# Patient Record
Sex: Male | Born: 1945 | Race: White | Hispanic: No | Marital: Married | Smoking: Never smoker
Health system: Southern US, Community
[De-identification: ages and names within clinical notes are randomized; demographics above are authoritative.]

## PROBLEM LIST (undated history)

## (undated) DIAGNOSIS — K219 Gastro-esophageal reflux disease without esophagitis: Secondary | ICD-10-CM

## (undated) DIAGNOSIS — I10 Essential (primary) hypertension: Secondary | ICD-10-CM

## (undated) DIAGNOSIS — D649 Anemia, unspecified: Secondary | ICD-10-CM

## (undated) DIAGNOSIS — K5792 Diverticulitis of intestine, part unspecified, without perforation or abscess without bleeding: Secondary | ICD-10-CM

## (undated) DIAGNOSIS — E039 Hypothyroidism, unspecified: Secondary | ICD-10-CM

## (undated) DIAGNOSIS — T7840XA Allergy, unspecified, initial encounter: Secondary | ICD-10-CM

## (undated) DIAGNOSIS — E079 Disorder of thyroid, unspecified: Secondary | ICD-10-CM

## (undated) DIAGNOSIS — F419 Anxiety disorder, unspecified: Secondary | ICD-10-CM

## (undated) DIAGNOSIS — E785 Hyperlipidemia, unspecified: Secondary | ICD-10-CM

## (undated) HISTORY — DX: Diverticulitis of intestine, part unspecified, without perforation or abscess without bleeding: K57.92

## (undated) HISTORY — DX: Disorder of thyroid, unspecified: E07.9

## (undated) HISTORY — DX: Allergy, unspecified, initial encounter: T78.40XA

## (undated) HISTORY — DX: Essential (primary) hypertension: I10

## (undated) HISTORY — DX: Anxiety disorder, unspecified: F41.9

## (undated) HISTORY — DX: Hyperlipidemia, unspecified: E78.5

## (undated) HISTORY — DX: Anemia, unspecified: D64.9

## (undated) HISTORY — DX: Gastro-esophageal reflux disease without esophagitis: K21.9

---
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
## 1989-07-03 DIAGNOSIS — E039 Hypothyroidism, unspecified: Secondary | ICD-10-CM | POA: Insufficient documentation

## 2002-11-03 HISTORY — PX: OTHER SURGICAL HISTORY: SHX169

## 2003-03-13 DIAGNOSIS — C439 Malignant melanoma of skin, unspecified: Secondary | ICD-10-CM

## 2003-03-13 HISTORY — DX: Malignant melanoma of skin, unspecified: C43.9

## 2003-11-04 DIAGNOSIS — K219 Gastro-esophageal reflux disease without esophagitis: Secondary | ICD-10-CM | POA: Insufficient documentation

## 2004-03-05 LAB — HM COLONOSCOPY

## 2004-11-21 ENCOUNTER — Ambulatory Visit: Payer: Self-pay | Admitting: Oncology

## 2005-05-20 ENCOUNTER — Ambulatory Visit: Payer: Self-pay | Admitting: Oncology

## 2005-06-03 ENCOUNTER — Ambulatory Visit: Payer: Self-pay | Admitting: Oncology

## 2005-08-22 ENCOUNTER — Ambulatory Visit: Payer: Self-pay | Admitting: Oncology

## 2005-08-27 ENCOUNTER — Ambulatory Visit: Payer: Self-pay | Admitting: Oncology

## 2005-08-28 ENCOUNTER — Ambulatory Visit: Payer: Self-pay | Admitting: Gastroenterology

## 2005-09-03 ENCOUNTER — Ambulatory Visit: Payer: Self-pay | Admitting: Oncology

## 2005-10-03 ENCOUNTER — Ambulatory Visit: Payer: Self-pay | Admitting: Oncology

## 2006-02-19 ENCOUNTER — Ambulatory Visit: Payer: Self-pay | Admitting: Oncology

## 2006-03-03 ENCOUNTER — Ambulatory Visit: Payer: Self-pay | Admitting: Oncology

## 2006-08-28 ENCOUNTER — Ambulatory Visit: Payer: Self-pay | Admitting: Oncology

## 2006-08-31 ENCOUNTER — Ambulatory Visit: Payer: Self-pay | Admitting: Oncology

## 2006-09-03 ENCOUNTER — Ambulatory Visit: Payer: Self-pay | Admitting: Oncology

## 2006-12-15 DIAGNOSIS — F419 Anxiety disorder, unspecified: Secondary | ICD-10-CM | POA: Insufficient documentation

## 2007-02-02 ENCOUNTER — Ambulatory Visit: Payer: Self-pay | Admitting: Oncology

## 2007-02-23 DIAGNOSIS — E785 Hyperlipidemia, unspecified: Secondary | ICD-10-CM | POA: Insufficient documentation

## 2007-03-01 ENCOUNTER — Ambulatory Visit: Payer: Self-pay | Admitting: Oncology

## 2007-03-04 ENCOUNTER — Ambulatory Visit: Payer: Self-pay | Admitting: Oncology

## 2007-08-04 ENCOUNTER — Ambulatory Visit: Payer: Self-pay | Admitting: Oncology

## 2007-08-06 ENCOUNTER — Ambulatory Visit: Payer: Self-pay | Admitting: Oncology

## 2007-08-11 ENCOUNTER — Ambulatory Visit: Payer: Self-pay | Admitting: Oncology

## 2007-09-04 ENCOUNTER — Ambulatory Visit: Payer: Self-pay | Admitting: Oncology

## 2007-10-06 DIAGNOSIS — N400 Enlarged prostate without lower urinary tract symptoms: Secondary | ICD-10-CM | POA: Insufficient documentation

## 2008-02-02 ENCOUNTER — Ambulatory Visit: Payer: Self-pay | Admitting: Oncology

## 2008-02-10 ENCOUNTER — Ambulatory Visit: Payer: Self-pay | Admitting: Oncology

## 2008-03-03 ENCOUNTER — Ambulatory Visit: Payer: Self-pay | Admitting: Oncology

## 2008-10-30 ENCOUNTER — Ambulatory Visit: Payer: Self-pay | Admitting: Family Medicine

## 2009-02-22 DIAGNOSIS — J309 Allergic rhinitis, unspecified: Secondary | ICD-10-CM | POA: Insufficient documentation

## 2010-02-18 DIAGNOSIS — I10 Essential (primary) hypertension: Secondary | ICD-10-CM | POA: Insufficient documentation

## 2012-06-25 DIAGNOSIS — H43399 Other vitreous opacities, unspecified eye: Secondary | ICD-10-CM | POA: Diagnosis not present

## 2012-08-13 DIAGNOSIS — F411 Generalized anxiety disorder: Secondary | ICD-10-CM | POA: Diagnosis not present

## 2012-08-13 DIAGNOSIS — I10 Essential (primary) hypertension: Secondary | ICD-10-CM | POA: Diagnosis not present

## 2012-08-13 DIAGNOSIS — Z23 Encounter for immunization: Secondary | ICD-10-CM | POA: Diagnosis not present

## 2012-08-13 DIAGNOSIS — E782 Mixed hyperlipidemia: Secondary | ICD-10-CM | POA: Diagnosis not present

## 2012-08-13 DIAGNOSIS — E039 Hypothyroidism, unspecified: Secondary | ICD-10-CM | POA: Diagnosis not present

## 2012-08-16 DIAGNOSIS — E782 Mixed hyperlipidemia: Secondary | ICD-10-CM | POA: Diagnosis not present

## 2012-08-16 DIAGNOSIS — I10 Essential (primary) hypertension: Secondary | ICD-10-CM | POA: Diagnosis not present

## 2012-08-16 DIAGNOSIS — E039 Hypothyroidism, unspecified: Secondary | ICD-10-CM | POA: Diagnosis not present

## 2012-10-14 DIAGNOSIS — Z23 Encounter for immunization: Secondary | ICD-10-CM | POA: Diagnosis not present

## 2012-10-14 DIAGNOSIS — J111 Influenza due to unidentified influenza virus with other respiratory manifestations: Secondary | ICD-10-CM | POA: Diagnosis not present

## 2012-10-14 DIAGNOSIS — F411 Generalized anxiety disorder: Secondary | ICD-10-CM | POA: Diagnosis not present

## 2013-02-03 DIAGNOSIS — E782 Mixed hyperlipidemia: Secondary | ICD-10-CM | POA: Diagnosis not present

## 2013-02-03 DIAGNOSIS — I10 Essential (primary) hypertension: Secondary | ICD-10-CM | POA: Diagnosis not present

## 2013-02-03 DIAGNOSIS — E039 Hypothyroidism, unspecified: Secondary | ICD-10-CM | POA: Diagnosis not present

## 2013-02-07 DIAGNOSIS — L570 Actinic keratosis: Secondary | ICD-10-CM | POA: Diagnosis not present

## 2013-02-07 DIAGNOSIS — D235 Other benign neoplasm of skin of trunk: Secondary | ICD-10-CM | POA: Diagnosis not present

## 2013-02-07 DIAGNOSIS — Z85820 Personal history of malignant melanoma of skin: Secondary | ICD-10-CM | POA: Diagnosis not present

## 2013-02-11 DIAGNOSIS — E785 Hyperlipidemia, unspecified: Secondary | ICD-10-CM | POA: Diagnosis not present

## 2013-02-11 DIAGNOSIS — E039 Hypothyroidism, unspecified: Secondary | ICD-10-CM | POA: Diagnosis not present

## 2013-02-11 DIAGNOSIS — F411 Generalized anxiety disorder: Secondary | ICD-10-CM | POA: Diagnosis not present

## 2013-02-11 DIAGNOSIS — I10 Essential (primary) hypertension: Secondary | ICD-10-CM | POA: Diagnosis not present

## 2013-05-23 DIAGNOSIS — I10 Essential (primary) hypertension: Secondary | ICD-10-CM | POA: Diagnosis not present

## 2013-05-23 DIAGNOSIS — E785 Hyperlipidemia, unspecified: Secondary | ICD-10-CM | POA: Diagnosis not present

## 2013-05-23 DIAGNOSIS — E039 Hypothyroidism, unspecified: Secondary | ICD-10-CM | POA: Diagnosis not present

## 2013-05-23 DIAGNOSIS — J069 Acute upper respiratory infection, unspecified: Secondary | ICD-10-CM | POA: Diagnosis not present

## 2013-07-20 DIAGNOSIS — Z23 Encounter for immunization: Secondary | ICD-10-CM | POA: Diagnosis not present

## 2013-07-20 DIAGNOSIS — J069 Acute upper respiratory infection, unspecified: Secondary | ICD-10-CM | POA: Diagnosis not present

## 2013-07-20 DIAGNOSIS — E785 Hyperlipidemia, unspecified: Secondary | ICD-10-CM | POA: Diagnosis not present

## 2013-07-20 DIAGNOSIS — E039 Hypothyroidism, unspecified: Secondary | ICD-10-CM | POA: Diagnosis not present

## 2013-07-20 DIAGNOSIS — I10 Essential (primary) hypertension: Secondary | ICD-10-CM | POA: Diagnosis not present

## 2013-08-01 DIAGNOSIS — I10 Essential (primary) hypertension: Secondary | ICD-10-CM | POA: Diagnosis not present

## 2013-08-01 DIAGNOSIS — E785 Hyperlipidemia, unspecified: Secondary | ICD-10-CM | POA: Diagnosis not present

## 2013-08-01 DIAGNOSIS — Z125 Encounter for screening for malignant neoplasm of prostate: Secondary | ICD-10-CM | POA: Diagnosis not present

## 2013-08-01 DIAGNOSIS — E039 Hypothyroidism, unspecified: Secondary | ICD-10-CM | POA: Diagnosis not present

## 2013-08-15 DIAGNOSIS — E039 Hypothyroidism, unspecified: Secondary | ICD-10-CM | POA: Diagnosis not present

## 2013-08-15 DIAGNOSIS — Z1339 Encounter for screening examination for other mental health and behavioral disorders: Secondary | ICD-10-CM | POA: Diagnosis not present

## 2013-08-15 DIAGNOSIS — Z0000 Encounter for general adult medical examination without abnormal findings: Secondary | ICD-10-CM | POA: Diagnosis not present

## 2013-08-15 DIAGNOSIS — I10 Essential (primary) hypertension: Secondary | ICD-10-CM | POA: Diagnosis not present

## 2013-08-15 DIAGNOSIS — E785 Hyperlipidemia, unspecified: Secondary | ICD-10-CM | POA: Diagnosis not present

## 2013-08-15 DIAGNOSIS — Z125 Encounter for screening for malignant neoplasm of prostate: Secondary | ICD-10-CM | POA: Diagnosis not present

## 2013-08-15 DIAGNOSIS — Z23 Encounter for immunization: Secondary | ICD-10-CM | POA: Diagnosis not present

## 2013-08-15 DIAGNOSIS — Z1212 Encounter for screening for malignant neoplasm of rectum: Secondary | ICD-10-CM | POA: Diagnosis not present

## 2013-08-29 DIAGNOSIS — D235 Other benign neoplasm of skin of trunk: Secondary | ICD-10-CM | POA: Diagnosis not present

## 2013-08-29 DIAGNOSIS — D485 Neoplasm of uncertain behavior of skin: Secondary | ICD-10-CM | POA: Diagnosis not present

## 2013-08-29 DIAGNOSIS — Z85820 Personal history of malignant melanoma of skin: Secondary | ICD-10-CM | POA: Diagnosis not present

## 2013-08-29 DIAGNOSIS — L570 Actinic keratosis: Secondary | ICD-10-CM | POA: Diagnosis not present

## 2013-10-13 DIAGNOSIS — D0460 Carcinoma in situ of skin of unspecified upper limb, including shoulder: Secondary | ICD-10-CM | POA: Diagnosis not present

## 2013-11-01 DIAGNOSIS — D485 Neoplasm of uncertain behavior of skin: Secondary | ICD-10-CM | POA: Diagnosis not present

## 2013-11-01 DIAGNOSIS — B372 Candidiasis of skin and nail: Secondary | ICD-10-CM | POA: Diagnosis not present

## 2013-11-14 DIAGNOSIS — D0470 Carcinoma in situ of skin of unspecified lower limb, including hip: Secondary | ICD-10-CM | POA: Diagnosis not present

## 2014-02-03 DIAGNOSIS — I10 Essential (primary) hypertension: Secondary | ICD-10-CM | POA: Diagnosis not present

## 2014-02-03 DIAGNOSIS — E039 Hypothyroidism, unspecified: Secondary | ICD-10-CM | POA: Diagnosis not present

## 2014-02-03 DIAGNOSIS — E785 Hyperlipidemia, unspecified: Secondary | ICD-10-CM | POA: Diagnosis not present

## 2014-02-03 DIAGNOSIS — Z23 Encounter for immunization: Secondary | ICD-10-CM | POA: Diagnosis not present

## 2014-02-03 DIAGNOSIS — R1011 Right upper quadrant pain: Secondary | ICD-10-CM | POA: Diagnosis not present

## 2014-02-03 DIAGNOSIS — Z1212 Encounter for screening for malignant neoplasm of rectum: Secondary | ICD-10-CM | POA: Diagnosis not present

## 2014-02-09 ENCOUNTER — Ambulatory Visit: Payer: Self-pay | Admitting: Family Medicine

## 2014-02-09 DIAGNOSIS — Q619 Cystic kidney disease, unspecified: Secondary | ICD-10-CM | POA: Diagnosis not present

## 2014-02-09 DIAGNOSIS — N281 Cyst of kidney, acquired: Secondary | ICD-10-CM | POA: Diagnosis not present

## 2014-02-09 DIAGNOSIS — K7689 Other specified diseases of liver: Secondary | ICD-10-CM | POA: Diagnosis not present

## 2014-02-09 DIAGNOSIS — R1011 Right upper quadrant pain: Secondary | ICD-10-CM | POA: Diagnosis not present

## 2014-02-10 DIAGNOSIS — Z1212 Encounter for screening for malignant neoplasm of rectum: Secondary | ICD-10-CM | POA: Diagnosis not present

## 2014-02-10 DIAGNOSIS — K7689 Other specified diseases of liver: Secondary | ICD-10-CM | POA: Diagnosis not present

## 2014-02-10 DIAGNOSIS — Z23 Encounter for immunization: Secondary | ICD-10-CM | POA: Diagnosis not present

## 2014-02-10 DIAGNOSIS — E039 Hypothyroidism, unspecified: Secondary | ICD-10-CM | POA: Diagnosis not present

## 2014-02-10 DIAGNOSIS — E785 Hyperlipidemia, unspecified: Secondary | ICD-10-CM | POA: Diagnosis not present

## 2014-02-27 DIAGNOSIS — D485 Neoplasm of uncertain behavior of skin: Secondary | ICD-10-CM | POA: Diagnosis not present

## 2014-02-27 DIAGNOSIS — Z85820 Personal history of malignant melanoma of skin: Secondary | ICD-10-CM | POA: Diagnosis not present

## 2014-02-27 DIAGNOSIS — L570 Actinic keratosis: Secondary | ICD-10-CM | POA: Diagnosis not present

## 2014-02-27 DIAGNOSIS — Z85828 Personal history of other malignant neoplasm of skin: Secondary | ICD-10-CM | POA: Diagnosis not present

## 2014-02-27 DIAGNOSIS — D045 Carcinoma in situ of skin of trunk: Secondary | ICD-10-CM | POA: Diagnosis not present

## 2014-04-03 DIAGNOSIS — D045 Carcinoma in situ of skin of trunk: Secondary | ICD-10-CM | POA: Diagnosis not present

## 2014-04-13 ENCOUNTER — Ambulatory Visit: Payer: Self-pay | Admitting: Family Medicine

## 2014-04-13 DIAGNOSIS — Z1212 Encounter for screening for malignant neoplasm of rectum: Secondary | ICD-10-CM | POA: Diagnosis not present

## 2014-04-13 DIAGNOSIS — Z23 Encounter for immunization: Secondary | ICD-10-CM | POA: Diagnosis not present

## 2014-04-13 DIAGNOSIS — R059 Cough, unspecified: Secondary | ICD-10-CM | POA: Diagnosis not present

## 2014-04-13 DIAGNOSIS — R05 Cough: Secondary | ICD-10-CM | POA: Diagnosis not present

## 2014-04-13 DIAGNOSIS — E039 Hypothyroidism, unspecified: Secondary | ICD-10-CM | POA: Diagnosis not present

## 2014-04-13 DIAGNOSIS — J9819 Other pulmonary collapse: Secondary | ICD-10-CM | POA: Diagnosis not present

## 2014-04-13 DIAGNOSIS — K449 Diaphragmatic hernia without obstruction or gangrene: Secondary | ICD-10-CM | POA: Diagnosis not present

## 2014-04-13 DIAGNOSIS — J069 Acute upper respiratory infection, unspecified: Secondary | ICD-10-CM | POA: Diagnosis not present

## 2014-04-13 DIAGNOSIS — E785 Hyperlipidemia, unspecified: Secondary | ICD-10-CM | POA: Diagnosis not present

## 2014-06-26 DIAGNOSIS — L570 Actinic keratosis: Secondary | ICD-10-CM | POA: Diagnosis not present

## 2014-07-17 DIAGNOSIS — K7689 Other specified diseases of liver: Secondary | ICD-10-CM | POA: Diagnosis not present

## 2014-07-17 DIAGNOSIS — H60399 Other infective otitis externa, unspecified ear: Secondary | ICD-10-CM | POA: Diagnosis not present

## 2014-07-17 DIAGNOSIS — Z1212 Encounter for screening for malignant neoplasm of rectum: Secondary | ICD-10-CM | POA: Diagnosis not present

## 2014-07-17 DIAGNOSIS — E785 Hyperlipidemia, unspecified: Secondary | ICD-10-CM | POA: Diagnosis not present

## 2014-07-17 DIAGNOSIS — Z23 Encounter for immunization: Secondary | ICD-10-CM | POA: Diagnosis not present

## 2014-07-17 DIAGNOSIS — E039 Hypothyroidism, unspecified: Secondary | ICD-10-CM | POA: Diagnosis not present

## 2014-07-24 DIAGNOSIS — L570 Actinic keratosis: Secondary | ICD-10-CM | POA: Diagnosis not present

## 2014-08-24 DIAGNOSIS — D225 Melanocytic nevi of trunk: Secondary | ICD-10-CM | POA: Diagnosis not present

## 2014-08-24 DIAGNOSIS — L570 Actinic keratosis: Secondary | ICD-10-CM | POA: Diagnosis not present

## 2014-08-24 DIAGNOSIS — Z85820 Personal history of malignant melanoma of skin: Secondary | ICD-10-CM | POA: Diagnosis not present

## 2014-08-24 DIAGNOSIS — D2261 Melanocytic nevi of right upper limb, including shoulder: Secondary | ICD-10-CM | POA: Diagnosis not present

## 2014-08-24 DIAGNOSIS — X32XXXA Exposure to sunlight, initial encounter: Secondary | ICD-10-CM | POA: Diagnosis not present

## 2014-08-24 DIAGNOSIS — D2262 Melanocytic nevi of left upper limb, including shoulder: Secondary | ICD-10-CM | POA: Diagnosis not present

## 2014-09-05 DIAGNOSIS — E785 Hyperlipidemia, unspecified: Secondary | ICD-10-CM | POA: Diagnosis not present

## 2014-09-05 DIAGNOSIS — E039 Hypothyroidism, unspecified: Secondary | ICD-10-CM | POA: Diagnosis not present

## 2014-09-05 DIAGNOSIS — D649 Anemia, unspecified: Secondary | ICD-10-CM | POA: Diagnosis not present

## 2014-09-05 DIAGNOSIS — R1011 Right upper quadrant pain: Secondary | ICD-10-CM | POA: Diagnosis not present

## 2014-09-05 LAB — LIPID PANEL
CHOLESTEROL: 171 mg/dL (ref 0–200)
HDL: 41 mg/dL (ref 35–70)
LDL CALC: 106 mg/dL
TRIGLYCERIDES: 122 mg/dL (ref 40–160)

## 2014-09-05 LAB — CBC AND DIFFERENTIAL
HEMATOCRIT: 40 % — AB (ref 41–53)
Hemoglobin: 13.3 g/dL — AB (ref 13.5–17.5)
PLATELETS: 165 10*3/uL (ref 150–399)
WBC: 6 10*3/mL

## 2014-09-05 LAB — BASIC METABOLIC PANEL
BUN: 13 mg/dL (ref 4–21)
CREATININE: 1.1 mg/dL (ref 0.6–1.3)
Glucose: 97 mg/dL
Potassium: 4.2 mmol/L (ref 3.4–5.3)
Sodium: 140 mmol/L (ref 137–147)

## 2014-09-05 LAB — HEPATIC FUNCTION PANEL
ALT: 21 U/L (ref 10–40)
AST: 24 U/L (ref 14–40)

## 2014-09-14 DIAGNOSIS — E039 Hypothyroidism, unspecified: Secondary | ICD-10-CM | POA: Diagnosis not present

## 2014-09-14 DIAGNOSIS — E785 Hyperlipidemia, unspecified: Secondary | ICD-10-CM | POA: Diagnosis not present

## 2014-09-14 DIAGNOSIS — D649 Anemia, unspecified: Secondary | ICD-10-CM | POA: Diagnosis not present

## 2014-09-14 DIAGNOSIS — F411 Generalized anxiety disorder: Secondary | ICD-10-CM | POA: Diagnosis not present

## 2014-09-14 DIAGNOSIS — Z23 Encounter for immunization: Secondary | ICD-10-CM | POA: Diagnosis not present

## 2014-11-04 DIAGNOSIS — Z23 Encounter for immunization: Secondary | ICD-10-CM | POA: Diagnosis not present

## 2014-11-04 DIAGNOSIS — D649 Anemia, unspecified: Secondary | ICD-10-CM | POA: Diagnosis not present

## 2014-11-04 DIAGNOSIS — J019 Acute sinusitis, unspecified: Secondary | ICD-10-CM | POA: Diagnosis not present

## 2014-11-04 DIAGNOSIS — F411 Generalized anxiety disorder: Secondary | ICD-10-CM | POA: Diagnosis not present

## 2014-11-04 DIAGNOSIS — E039 Hypothyroidism, unspecified: Secondary | ICD-10-CM | POA: Diagnosis not present

## 2014-11-27 DIAGNOSIS — E039 Hypothyroidism, unspecified: Secondary | ICD-10-CM | POA: Diagnosis not present

## 2014-11-27 DIAGNOSIS — F411 Generalized anxiety disorder: Secondary | ICD-10-CM | POA: Diagnosis not present

## 2014-11-27 DIAGNOSIS — D649 Anemia, unspecified: Secondary | ICD-10-CM | POA: Diagnosis not present

## 2014-11-27 DIAGNOSIS — Z23 Encounter for immunization: Secondary | ICD-10-CM | POA: Diagnosis not present

## 2014-11-27 DIAGNOSIS — J019 Acute sinusitis, unspecified: Secondary | ICD-10-CM | POA: Diagnosis not present

## 2015-01-03 DIAGNOSIS — H2513 Age-related nuclear cataract, bilateral: Secondary | ICD-10-CM | POA: Diagnosis not present

## 2015-01-10 IMAGING — US ABDOMEN ULTRASOUND
1 series · 14 of 25 positions shown · non-contrast
Comparison: PET-CT, 08/28/2006

CLINICAL DATA: Right upper quadrant abdominal pain.

EXAM:
ULTRASOUND ABDOMEN COMPLETE

[Series 1: abdomen ultrasound · 0.30mm/px · 14 of 99 slices shown]
[im 1/99]
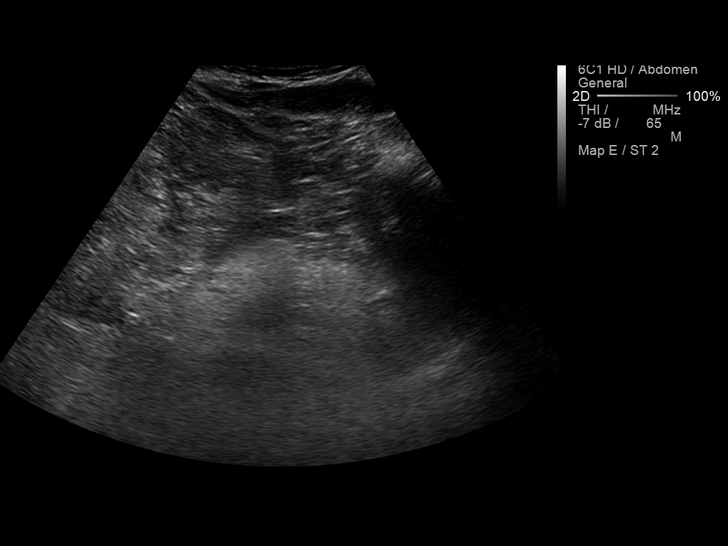
[im 9/99]
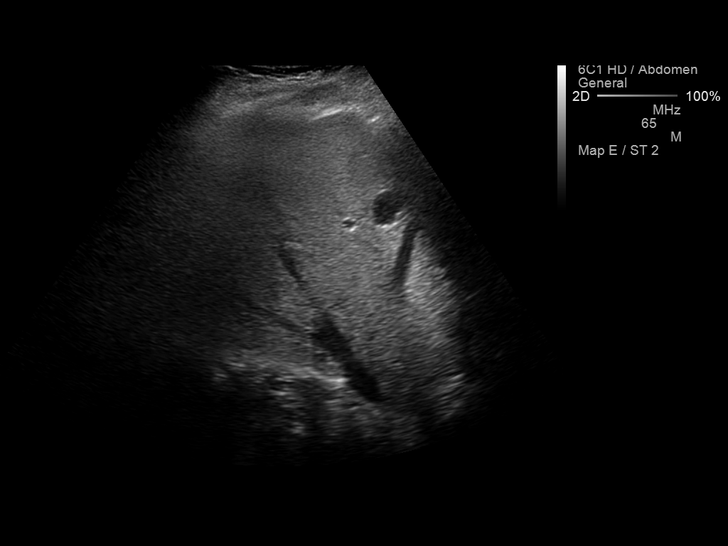
[im 17/99]
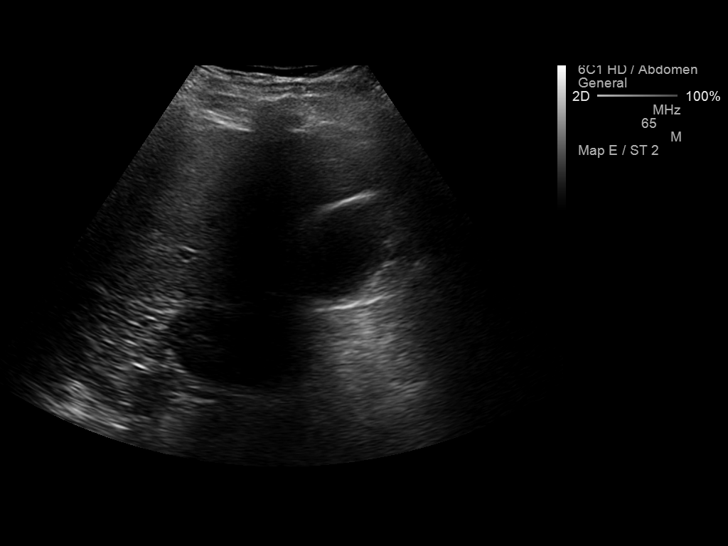
[im 25/99]
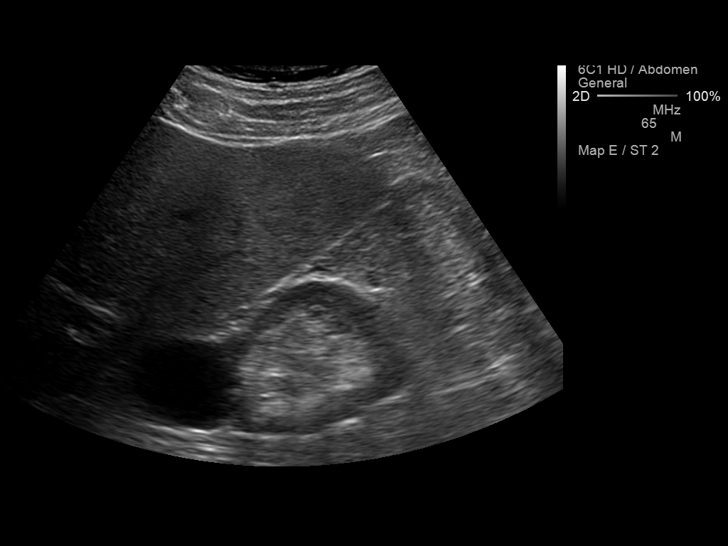
[im 33/99]
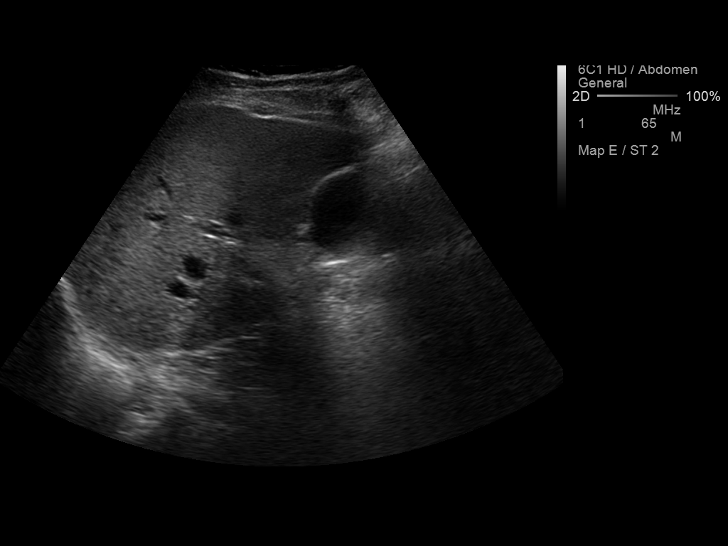
[im 37/99]
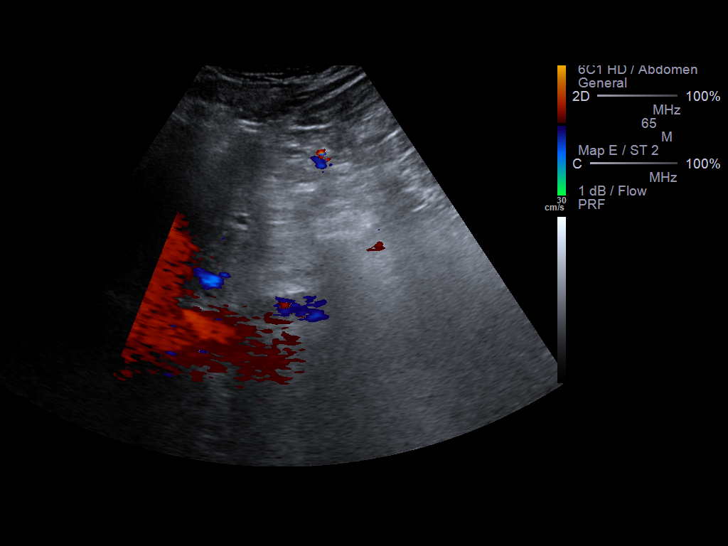
[im 45/99]
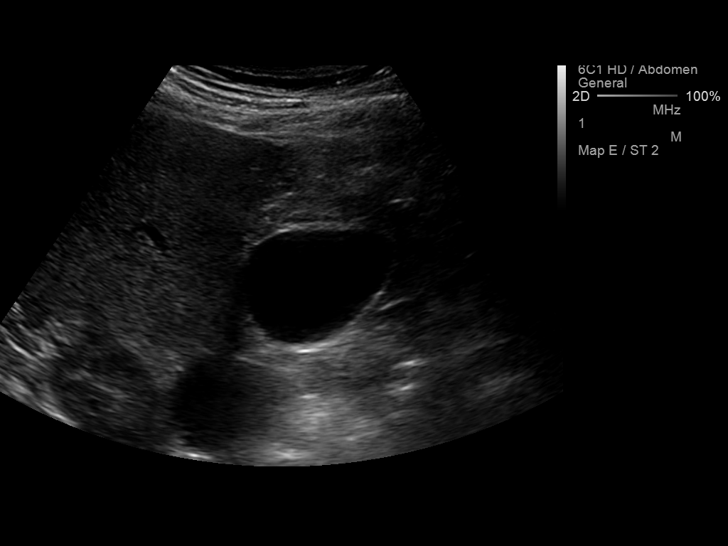
[im 54/99]
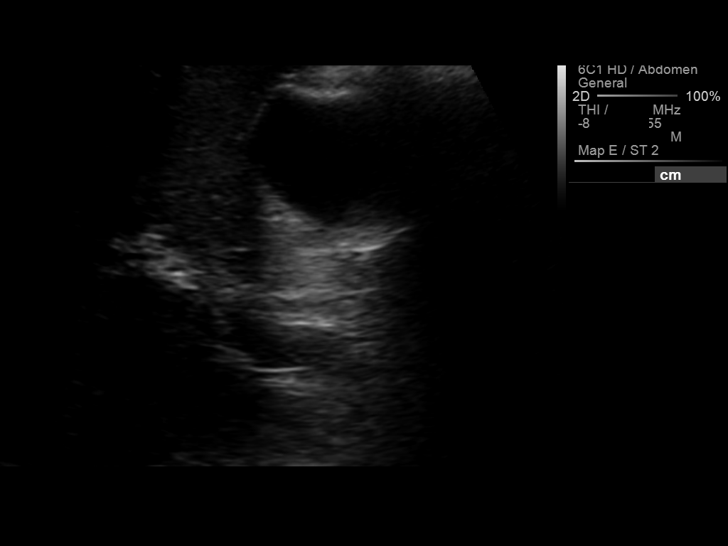
[im 62/99]
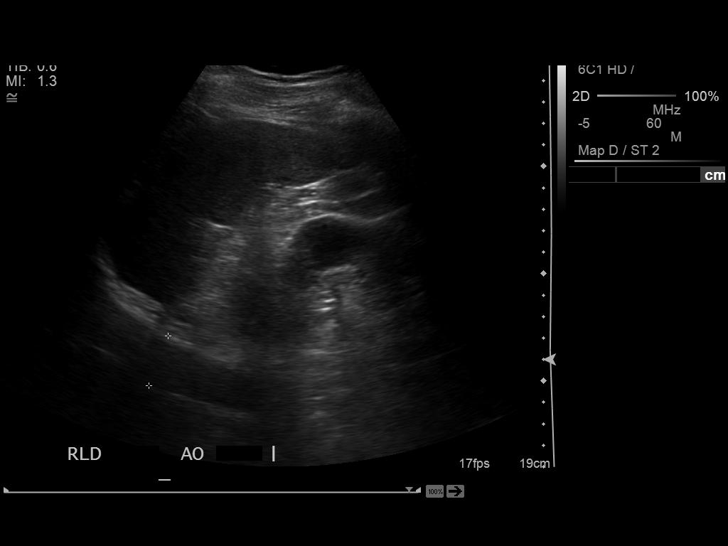
[im 66/99]
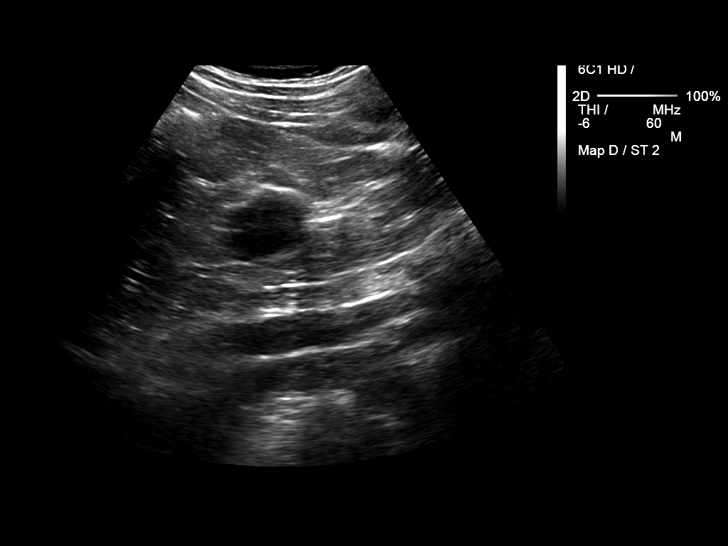
[im 74/99]
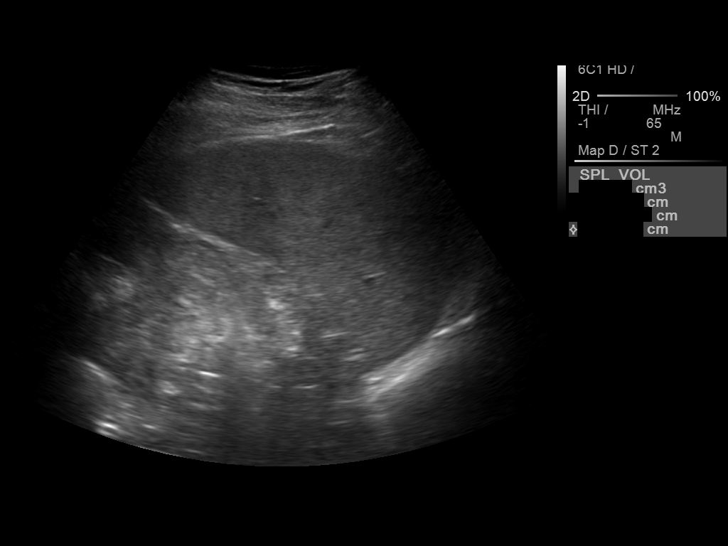
[im 82/99]
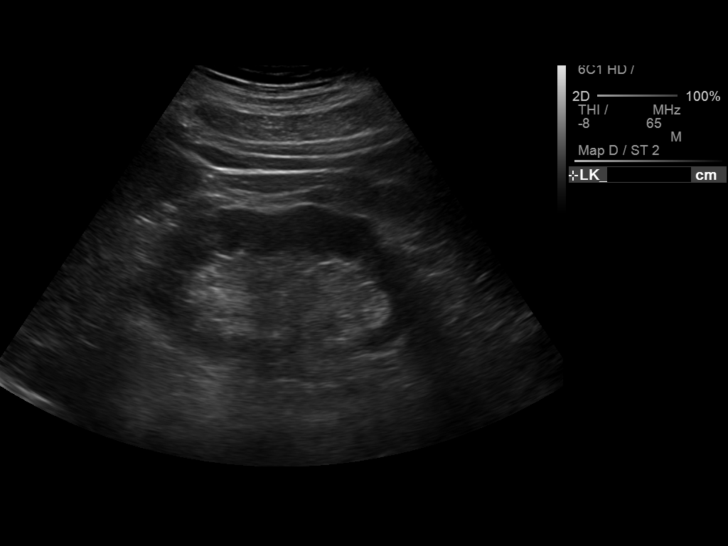
[im 90/99]
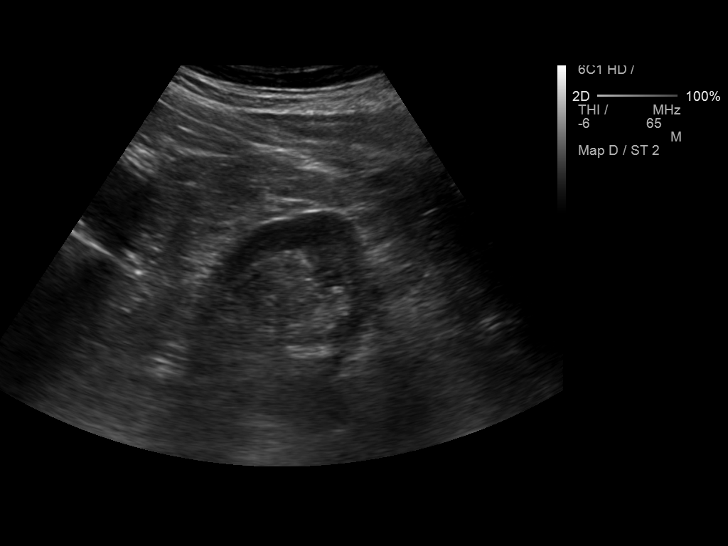
[im 99/99]
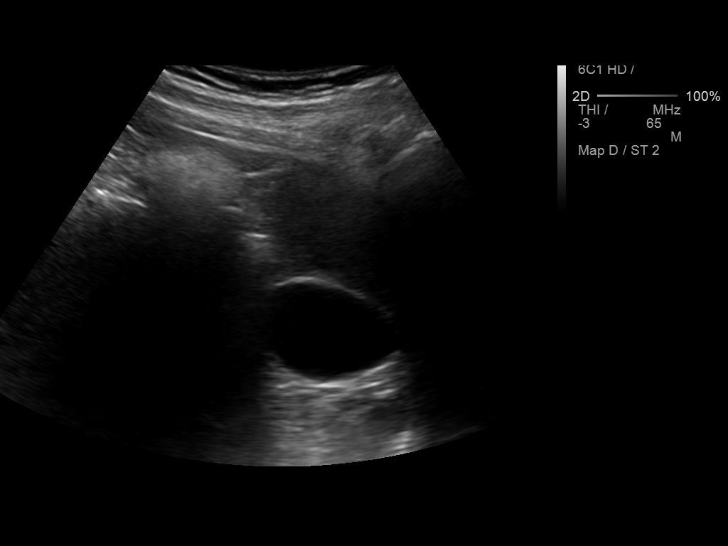

[14 of 25 positions shown; findings below may reference images not displayed]

FINDINGS: Gallbladder:

No gallstones or wall thickening visualized. No sonographic Murphy
sign noted.

Common bile duct:

Diameter: 3.8 mm

Liver:

Increased overall echogenicity. 2 cm cyst in the left lobe near the
gallbladder fossa. No other renal masses or lesions. Normal
hepatopetal flow in the portal vein.

IVC:

No abnormality visualized.

Pancreas:

Mostly obscured by bowel gas.

Spleen:

Size and appearance within normal limits.

Right Kidney:

Length: 9.7 cm. 3.6 cm upper pole simple cyst. No other renal
masses. No hydronephrosis.

Left Kidney:

Length: 10.8 cm. 5.5 cm upper pole simple appearing cyst. No other
renal masses. No hydronephrosis.

Abdominal aorta:

No aneurysm visualized.

Other findings:

None.
IMPRESSION: 1. No acute findings.  Normal gallbladder.  No bile duct dilation.
2. Hepatic steatosis. Small liver cyst. An echogenic lesion
described on prior ultrasound was not visualized on the current
study. No other liver lesions.
3. Bilateral renal cysts.

## 2015-03-14 IMAGING — CR DG CHEST 2V
1 series · 2 of 2 positions shown · non-contrast
Comparison: Chest x-ray 10/22/2008.

CLINICAL DATA: Cough.

EXAM:
CHEST  2 VIEW

[Series 1: pa · 0.17mm/px · 2 of 2 slices shown]
[im 1/2]
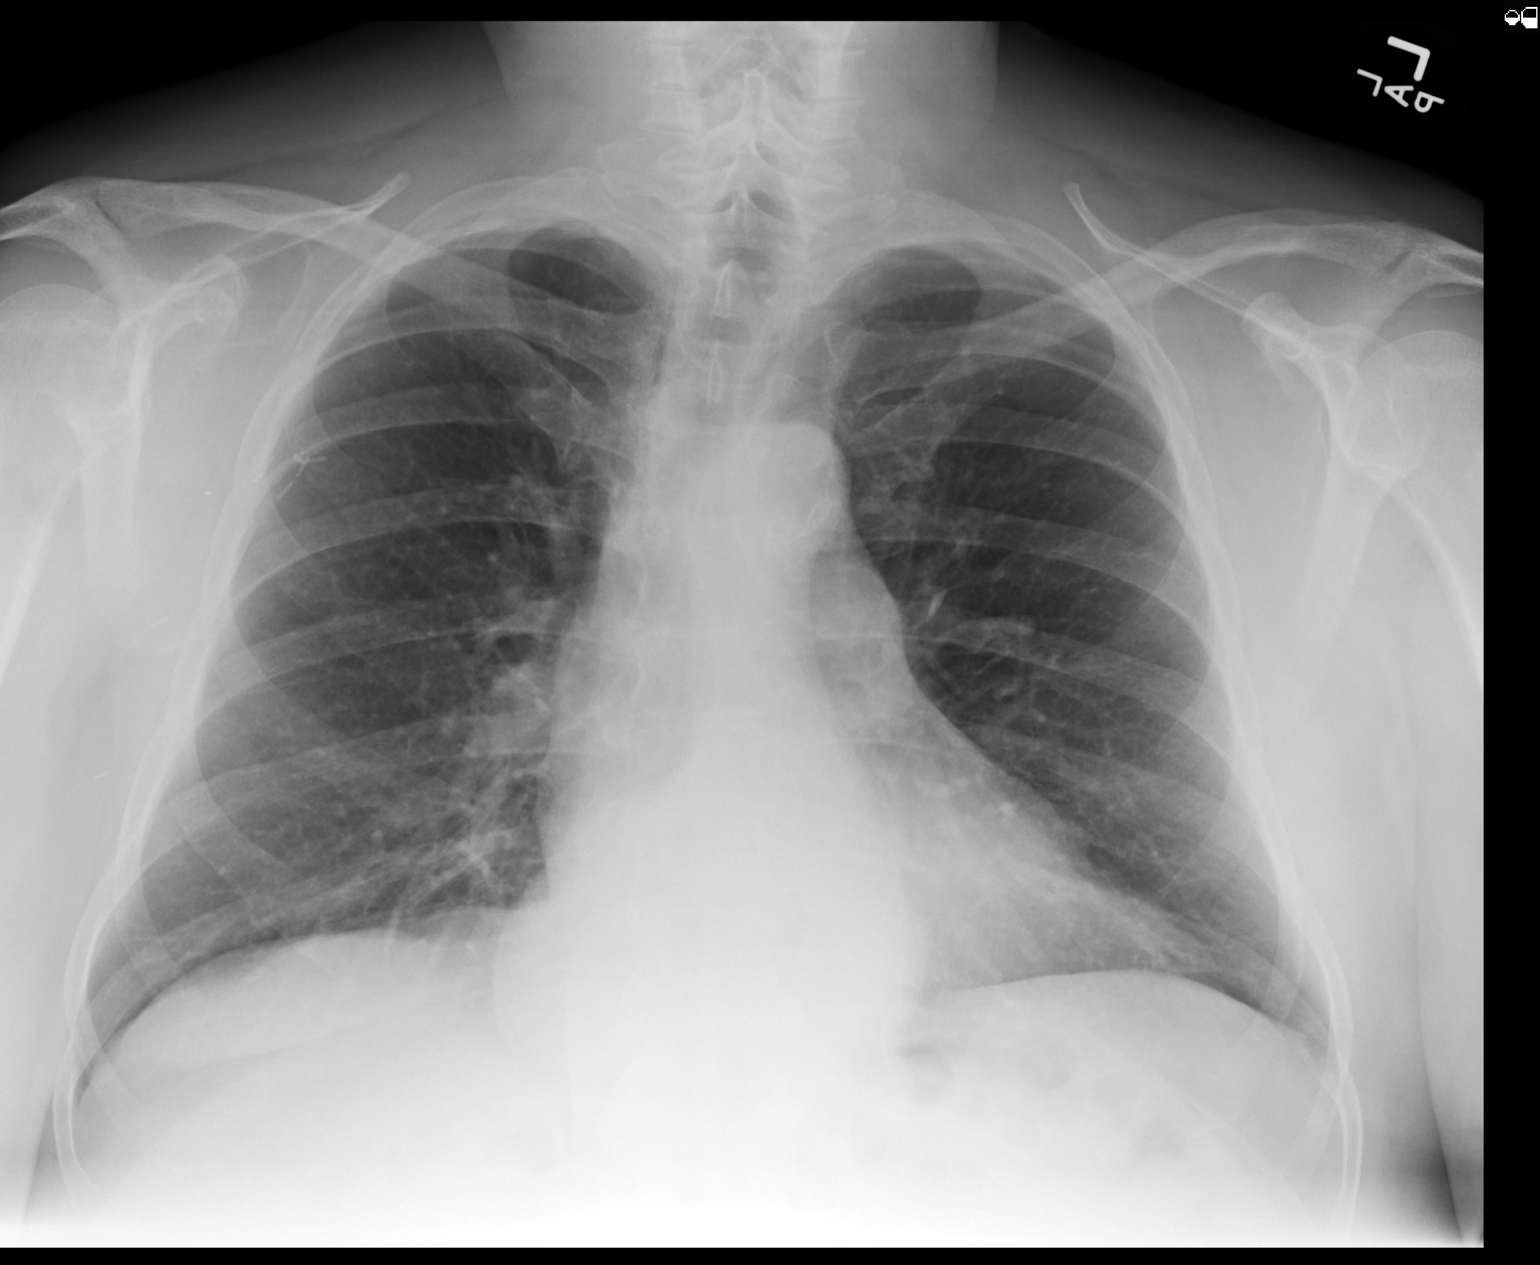
[im 2/2]
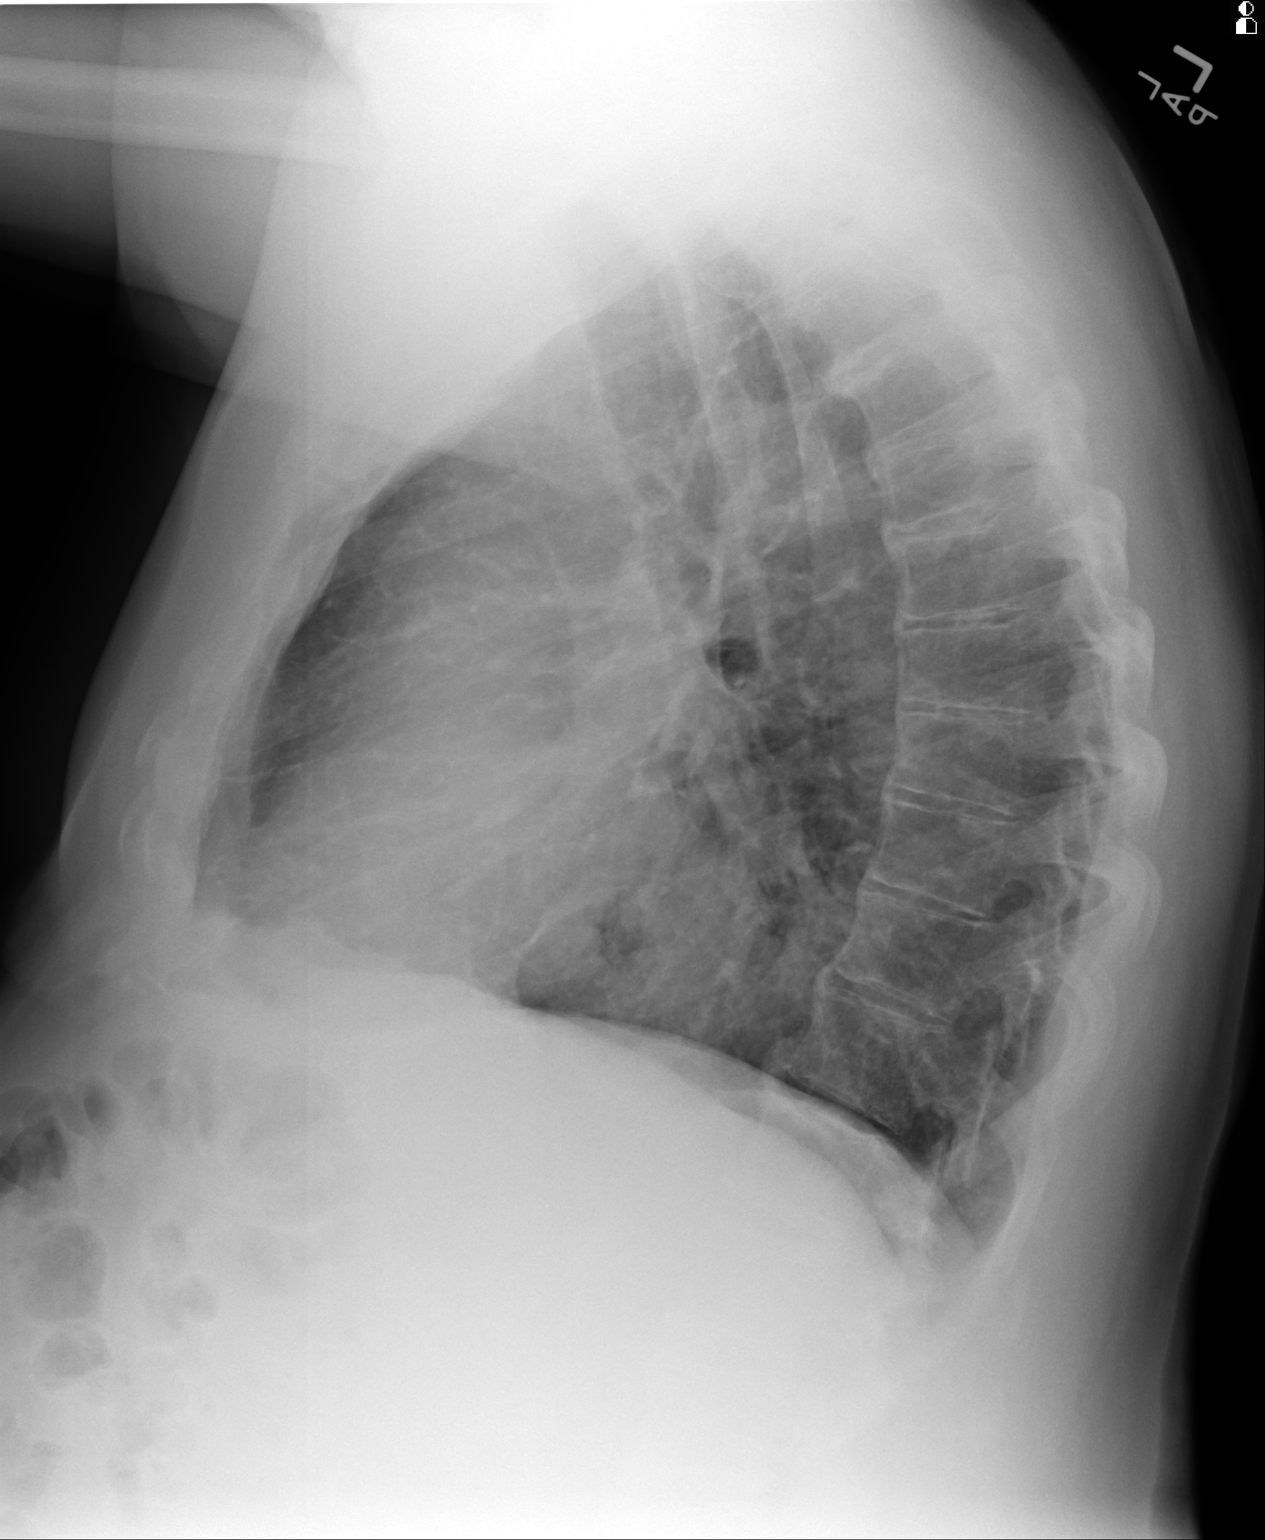

[2 of 2 positions shown; findings below may reference images not displayed]

FINDINGS: Mediastinum and hilar structures are normal. Mild bibasilar
atelectasis . Heart size stable. Sliding hiatal hernia. No acute
bony abnormality.
IMPRESSION: 1. Mild bibasilar subsegmental axis.

2. Sliding hiatal hernia.

## 2015-04-16 DIAGNOSIS — D225 Melanocytic nevi of trunk: Secondary | ICD-10-CM | POA: Diagnosis not present

## 2015-04-16 DIAGNOSIS — L570 Actinic keratosis: Secondary | ICD-10-CM | POA: Diagnosis not present

## 2015-04-16 DIAGNOSIS — Z85820 Personal history of malignant melanoma of skin: Secondary | ICD-10-CM | POA: Diagnosis not present

## 2015-04-16 DIAGNOSIS — D2261 Melanocytic nevi of right upper limb, including shoulder: Secondary | ICD-10-CM | POA: Diagnosis not present

## 2015-04-16 DIAGNOSIS — Z85828 Personal history of other malignant neoplasm of skin: Secondary | ICD-10-CM | POA: Diagnosis not present

## 2015-05-07 ENCOUNTER — Other Ambulatory Visit: Payer: Self-pay | Admitting: Family Medicine

## 2015-07-03 ENCOUNTER — Encounter: Payer: Self-pay | Admitting: Family Medicine

## 2015-07-30 ENCOUNTER — Ambulatory Visit (INDEPENDENT_AMBULATORY_CARE_PROVIDER_SITE_OTHER): Payer: Medicare Other | Admitting: Family Medicine

## 2015-07-30 ENCOUNTER — Encounter: Payer: Self-pay | Admitting: Family Medicine

## 2015-07-30 VITALS — BP 170/84 | HR 80 | Temp 98.7°F | Resp 16 | Ht 65.0 in | Wt 204.0 lb

## 2015-07-30 DIAGNOSIS — K219 Gastro-esophageal reflux disease without esophagitis: Secondary | ICD-10-CM

## 2015-07-30 DIAGNOSIS — D649 Anemia, unspecified: Secondary | ICD-10-CM | POA: Insufficient documentation

## 2015-07-30 DIAGNOSIS — Z0000 Encounter for general adult medical examination without abnormal findings: Secondary | ICD-10-CM

## 2015-07-30 DIAGNOSIS — Z125 Encounter for screening for malignant neoplasm of prostate: Secondary | ICD-10-CM

## 2015-07-30 DIAGNOSIS — N410 Acute prostatitis: Secondary | ICD-10-CM | POA: Diagnosis not present

## 2015-07-30 DIAGNOSIS — E785 Hyperlipidemia, unspecified: Secondary | ICD-10-CM

## 2015-07-30 DIAGNOSIS — Z23 Encounter for immunization: Secondary | ICD-10-CM | POA: Diagnosis not present

## 2015-07-30 DIAGNOSIS — K760 Fatty (change of) liver, not elsewhere classified: Secondary | ICD-10-CM | POA: Insufficient documentation

## 2015-07-30 DIAGNOSIS — F419 Anxiety disorder, unspecified: Secondary | ICD-10-CM

## 2015-07-30 DIAGNOSIS — E039 Hypothyroidism, unspecified: Secondary | ICD-10-CM

## 2015-07-30 DIAGNOSIS — J309 Allergic rhinitis, unspecified: Secondary | ICD-10-CM

## 2015-07-30 DIAGNOSIS — I10 Essential (primary) hypertension: Secondary | ICD-10-CM

## 2015-07-30 DIAGNOSIS — K7689 Other specified diseases of liver: Secondary | ICD-10-CM | POA: Insufficient documentation

## 2015-07-30 LAB — POCT URINALYSIS DIPSTICK
BILIRUBIN UA: NEGATIVE
GLUCOSE UA: NEGATIVE
Ketones, UA: NEGATIVE
Leukocytes, UA: NEGATIVE
NITRITE UA: NEGATIVE
PH UA: 6
Protein, UA: NEGATIVE
RBC UA: NEGATIVE
UROBILINOGEN UA: 0.2

## 2015-07-30 MED ORDER — ALPRAZOLAM 0.5 MG PO TABS: 0.2500 mg | ORAL_TABLET | Freq: Three times a day (TID) | ORAL | Status: DC | PRN

## 2015-07-30 MED ORDER — CIPROFLOXACIN HCL 500 MG PO TABS: 500.0000 mg | ORAL_TABLET | Freq: Two times a day (BID) | ORAL | Status: DC

## 2015-07-30 MED ORDER — OMEPRAZOLE 20 MG PO CPDR: 20.0000 mg | DELAYED_RELEASE_CAPSULE | Freq: Every day | ORAL | Status: DC

## 2015-07-30 MED ORDER — TAMSULOSIN HCL 0.4 MG PO CAPS: 0.4000 mg | ORAL_CAPSULE | Freq: Every day | ORAL | Status: DC

## 2015-07-30 MED ORDER — TOLTERODINE TARTRATE ER 2 MG PO CP24: 2.0000 mg | ORAL_CAPSULE | Freq: Every day | ORAL | Status: DC

## 2015-07-30 NOTE — Progress Notes (Signed)
Patient ID: Christian Mora, male   DOB: 03-29-1946, 69 y.o.   MRN: [id number]        [name], Male    DOB: [birth year], 69 y.o.   MRN: [id number] Visit Date: 07/30/2015  Today's Provider: Lelon Huh, MD   Chief Complaint  Patient presents with  . Medicare Wellness  . Urinary Frequency  . Stress  . Hypothyroidism  . Hyperlipidemia  . Hypertension  . Gastrophageal Reflux   [name] He feels fairly well. He reports exercising stays active with work. He reports he is sleeping fairly well. Patient reports that he has been under a lot stress with taking care of mother. Patient reports that BP readings have been elevated also.   08/15/13 CPE 08/01/13 PSA 0.6  Lab Results  Component Value Date   WBC 6.0 09/05/2014   HGB 13.3* 09/05/2014   HCT 40* 09/05/2014   PLT 165 09/05/2014   CHOL 171 09/05/2014   TRIG 122 09/05/2014   HDL 41 09/05/2014   LDLCALC 106 09/05/2014   ALT 21 09/05/2014   AST 24 09/05/2014   NA 140 09/05/2014   K 4.2 09/05/2014   CREATININE 1.1 09/05/2014   BUN 13 09/05/2014    Urinary Frequency: Patient complains of dysuria He has had symptoms for a few days. Patient also complains of frequency. Patient denies fever. Patient does not have a history of recurrent UTI.  Patient does not have a history of pyelonephritis.    Anxiety He has had much more anxiety the last six months since his father passed away and is dealing with the estate and his mother's declining health. He takes alprazolam occasionally, usually just 1/2 tablet which works well  GERD He takes one omeprazole every day and never gets hearburn. He has been on this for years due to hiatal hernia.   Hypertension He is cautious with diet avoids sodium. He checks BP at home several times a week and SBP is usually in the 120s to 130s, but some sometimes into the upper 140s. No headaches, no chest pain. No dizziness, no  visual disturbances.  BMET    Component Value Date/Time   NA 140 09/05/2014   K 4.2 09/05/2014   BUN 13 09/05/2014   CREATININE 1.1 09/05/2014    Hypothyroid Is doing well with current thyroid replacement. He has been a little fatigued, but not unusually so, and may be more related to stress.  No results found for: TSH   Hyperlipidemia.  He is taking lovastatin every day, tolerating well with no adverse effects, and generally tries to avoid saturated fats in diet.   Lab Results  Component Value Date   CHOL 171 09/05/2014   HDL 41 09/05/2014   LDLCALC 106 09/05/2014   TRIG 122 09/05/2014     -----------------------------------------------------------   Review of Systems  Constitutional: Negative.   HENT: Positive for hearing loss.   Eyes: Negative.   Respiratory: Negative.   Cardiovascular: Negative.   Gastrointestinal: Negative.   Endocrine: Negative.   Genitourinary: Positive for dysuria, urgency and frequency.  Musculoskeletal: Negative.   Skin: Negative.   Allergic/Immunologic: Negative.   Neurological: Negative.   Hematological: Negative.   Psychiatric/Behavioral: The patient is nervous/anxious.     Social History   Social History  . Marital Status: Married    Spouse Name: N/A  . Number of Children: N/A  . Years of Education: N/A  Occupational History  . Not on file.   Social History Main Topics  . Smoking status: Never Smoker   . Smokeless tobacco: Never Used  . Alcohol Use: No  . Drug Use: No  . Sexual Activity: Not on file   Other Topics Concern  . Not on file   Social History Narrative    Patient Active Problem List   Diagnosis Date Noted  . Absolute anemia 07/30/2015  . Fatty infiltration of liver 07/30/2015  . 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 Essential (primary) hypertension 02/18/2010  . Allergic rhinitis 02/22/2009  . Benign prostatic hypertrophy without urinary obstruction 10/06/2007  . HLD (hyperlipidemia) 02/23/2007  . Anxiety  12/15/2006  . Acid reflux 11/04/2003  . Melanoma of skin 03/13/2003  . Adult hypothyroidism 07/03/1989    Past Surgical History  Procedure Laterality Date  . Lymph node removed  2004  . Tonsillectomy and adenoidectomy  1954    His family history includes Cancer in his father; Diabetes in his son; Hypertension in his mother.    Previous Medications   ALPRAZOLAM (XANAX) 0.5 MG TABLET    Take by mouth. 1/2 - 1 tablet 3 times a daily, PRN   ASPIRIN 81 MG TABLET    Take 1 tablet by mouth daily.   HYDROCORTISONE (ANUSOL-HC) 2.5 % RECTAL CREAM    Place 1 application rectally 2 (two) times daily.   LEVOTHYROXINE (SYNTHROID, LEVOTHROID) 112 MCG TABLET    Take 1 tablet by mouth daily.   LOVASTATIN (MEVACOR) 20 MG TABLET    TAKE ONE TABLET BY MOUTH EVERY DAY   MULTIPLE VITAMIN PO    Take 1 tablet by mouth daily.   OMEPRAZOLE (PRILOSEC) 20 MG CAPSULE    Take 1 capsule by mouth daily.   TAMSULOSIN (FLOMAX) 0.4 MG CAPS CAPSULE    Take 1 capsule by mouth daily.    Patient Care Team: Birdie Sons, MD as PCP - General (Family Medicine)     Objective:   Vitals: BP 170/84 mmHg  Pulse 80  Temp(Src) 98.7 F (37.1 C) (Oral)  Resp 16  Ht 5\' 5"  (1.651 m)  Wt 204 lb (92.534 kg)  BMI 33.95 kg/m2  Physical Exam   General Appearance:    Alert, cooperative, no distress, appears stated age, obese  Head:    Normocephalic, without obvious abnormality, atraumatic  Eyes:    PERRL, conjunctiva/corneas clear, EOM's intact, fundi    benign, both eyes       Ears:    Normal TM's and external ear canals, both ears  Nose:   Nares normal, septum midline, mucosa normal, no drainage   or sinus tenderness  Throat:   Lips, mucosa, and tongue normal; teeth and gums normal  Neck:   Supple, symmetrical, trachea midline, no adenopathy;       thyroid:  No enlargement/tenderness/nodules; no carotid   bruit or JVD  Back:     Symmetric, no curvature, ROM normal, no CVA tenderness  Lungs:     Clear to  auscultation bilaterally, respirations unlabored  Chest wall:    No tenderness or deformity  Heart:    Regular rate and rhythm, S1 and S2 normal, no murmur, rub   or gallop  Abdomen:     Soft, non-tender, bowel sounds active all four quadrants,    no masses, no organomegaly  Genitalia:    deferred  Rectal:    Mildly enlarged, tender, soft, boggy prostate.   Extremities:   Extremities normal, atraumatic, no cyanosis or  edema  Pulses:   2+ and symmetric all extremities  Skin:   Skin color, texture, turgor normal, no rashes or lesions  Lymph nodes:   Cervical, supraclavicular, and axillary nodes normal  Neurologic:   CNII-XII intact. Normal strength, sensation and reflexes      throughout     Activities of Daily Living In your present state of health, do you have any difficulty performing the following activities: 07/30/2015  Hearing? Y  Vision? N  Difficulty concentrating or making decisions? N  Walking or climbing stairs? N  Dressing or bathing? N  Doing errands, shopping? N    Fall Risk Assessment Fall Risk  07/30/2015  Falls in the past year? No     Depression Screen PHQ 2/9 Scores 07/30/2015  PHQ - 2 Score 0    Cognitive Testing - 6-CIT  Correct? Score   What year is it? yes 0 0 or 4  What month is it? yes 0 0 or 3  Memorize:    Pia Mau,  42,  [address]  [place],      What time is it? (within 1 hour) yes 0 0 or 3  Count backwards from 20 yes 1 0, 2, or 4  Name the months of the year yes 0 0, 2, or 4  Repeat name & address above yes 0 0, 2, 4, 6, 8, or 10       TOTAL SCORE  1/28   Interpretation:  Normal  Normal (0-7) Abnormal (8-28)       Assessment & Plan:     Annual Wellness Visit  Reviewed patient's Family Medical History Reviewed and updated list of patient's medical providers Assessment of cognitive impairment was done Assessed patient's functional ability Established a written schedule for health screening [employer]  Completed and Reviewed  Exercise Activities and Dietary recommendations Goals    . Exercise 150 minutes per week (moderate activity)       Immunization History  Administered Date(s) Administered  . Pneumococcal Conjugate-13 09/14/2014  . Pneumococcal Polysaccharide-23 08/13/2012  . Zoster 08/29/2013    Health Maintenance  Topic Date Due  . Hepatitis C Screening  01-24-1946  . TETANUS/TDAP  01/23/1965  . COLONOSCOPY  01/24/1996  . ZOSTAVAX  01/23/2006  . PNA vac Low Risk Adult (1 of 2 - PCV13) 01/24/2011  . INFLUENZA VACCINE  06/04/2015       ------------------------------------------------------------------------------------------------------------ 1. Medicare annual wellness visit, subsequent   2. Acute prostatitis He requests something to make his bladder less irritable while treating bladder infection.  - POCT urinalysis dipstick - ciprofloxacin (CIPRO) 500 MG tablet; Take 1 tablet (500 mg total) by mouth 2 (two) times daily.  Dispense: 28 tablet; Refill: 0 - tolterodine (DETROL LA) 2 MG 24 hr capsule; Take 1 capsule (2 mg total) by mouth daily.  Dispense: 14 capsule; Refill: 0 - tamsulosin (FLOMAX) 0.4 MG CAPS capsule; Take 1 capsule (0.4 mg total) by mouth daily.  Dispense: 90 capsule; Refill: 4  3. Anemia, unspecified anemia type  - CBC  4. Gastroesophageal reflux disease without esophagitis Counseled of potential adverse effects of chronic PPI use and to try taking prn if he has reflux symptoms less than 3 times a week.  - omeprazole (PRILOSEC) 20 MG capsule; Take 1-2 capsules (20-40 mg total) by mouth daily. As needed for heartburn  Dispense: 60 capsule; Refill: 3  5. Hypothyroidism, unspecified hypothyroidism type Time to check [facility]- TSH  6. Allergic rhinitis, unspecified allergic rhinitis type  7. Essential (primary) hypertension Home BP much better than office readings. Continue to work on diet and exercise and follow up 3-4 months for BP check.  -  EKG 12-Lead  8. HLD (hyperlipidemia) He is tolerating lovastatin well with no adverse effects.   - Comprehensive metabolic panel - Lipid panel  9. Need for influenza vaccination - Flu vaccine HIGH DOSE PF  10. Prostate cancer screening  - PSA  11. Anxiety Worse since his father passed away. Advised he may take 1/2 tablet every morning and 1 or two more times prn.  - ALPRAZolam (XANAX) 0.5 MG tablet; Take 0.5-1 tablets (0.25-0.5 mg total) by mouth 3 (three) times daily as needed for anxiety.  Dispense: 60 tablet; Refill: 3

## 2015-07-31 DIAGNOSIS — E785 Hyperlipidemia, unspecified: Secondary | ICD-10-CM | POA: Diagnosis not present

## 2015-07-31 DIAGNOSIS — Z125 Encounter for screening for malignant neoplasm of prostate: Secondary | ICD-10-CM | POA: Diagnosis not present

## 2015-07-31 DIAGNOSIS — E039 Hypothyroidism, unspecified: Secondary | ICD-10-CM | POA: Diagnosis not present

## 2015-07-31 DIAGNOSIS — D649 Anemia, unspecified: Secondary | ICD-10-CM | POA: Diagnosis not present

## 2015-08-01 ENCOUNTER — Telehealth: Payer: Self-pay | Admitting: Family Medicine

## 2015-08-01 LAB — CBC
HEMATOCRIT: 38.1 % (ref 37.5–51.0)
Hemoglobin: 12.9 g/dL (ref 12.6–17.7)
MCH: 29.3 pg (ref 26.6–33.0)
MCHC: 33.9 g/dL (ref 31.5–35.7)
MCV: 86 fL (ref 79–97)
PLATELETS: 171 10*3/uL (ref 150–379)
RBC: 4.41 x10E6/uL (ref 4.14–5.80)
RDW: 16 % — AB (ref 12.3–15.4)
WBC: 5.8 10*3/uL (ref 3.4–10.8)

## 2015-08-01 LAB — COMPREHENSIVE METABOLIC PANEL
A/G RATIO: 1.4 (ref 1.1–2.5)
ALK PHOS: 52 IU/L (ref 39–117)
ALT: 20 IU/L (ref 0–44)
AST: 24 IU/L (ref 0–40)
Albumin: 4 g/dL (ref 3.6–4.8)
BUN/Creatinine Ratio: 9 — ABNORMAL LOW (ref 10–22)
BUN: 11 mg/dL (ref 8–27)
Bilirubin Total: 0.7 mg/dL (ref 0.0–1.2)
CALCIUM: 8.9 mg/dL (ref 8.6–10.2)
CHLORIDE: 103 mmol/L (ref 97–108)
CO2: 23 mmol/L (ref 18–29)
Creatinine, Ser: 1.17 mg/dL (ref 0.76–1.27)
GFR calc Af Amer: 73 mL/min/{1.73_m2} (ref >59)
GFR, EST NON AFRICAN AMERICAN: 63 mL/min/{1.73_m2} (ref >59)
GLOBULIN, TOTAL: 2.8 g/dL (ref 1.5–4.5)
Glucose: 93 mg/dL (ref 65–99)
POTASSIUM: 4 mmol/L (ref 3.5–5.2)
SODIUM: 142 mmol/L (ref 134–144)
Total Protein: 6.8 g/dL (ref 6.0–8.5)

## 2015-08-01 LAB — TSH: TSH: 2.86 u[IU]/mL (ref 0.450–4.500)

## 2015-08-01 LAB — LIPID PANEL
CHOLESTEROL TOTAL: 140 mg/dL (ref 100–199)
Chol/HDL Ratio: 3.6 ratio units (ref 0.0–5.0)
HDL: 39 mg/dL — AB (ref >39)
LDL Calculated: 71 mg/dL (ref 0–99)
TRIGLYCERIDES: 148 mg/dL (ref 0–149)
VLDL Cholesterol Cal: 30 mg/dL (ref 5–40)

## 2015-08-01 LAB — PSA: Prostate Specific Ag, Serum: 0.6 ng/mL (ref 0.0–4.0)

## 2015-08-01 NOTE — Telephone Encounter (Signed)
Left message letting pt know that lab results are ready to pick-up.

## 2015-08-01 NOTE — Telephone Encounter (Signed)
Pt left message requesting to get a copy of his last labs.  CC

## 2015-08-03 ENCOUNTER — Other Ambulatory Visit: Payer: Self-pay | Admitting: Family Medicine

## 2015-08-16 ENCOUNTER — Other Ambulatory Visit: Payer: Self-pay | Admitting: Family Medicine

## 2015-08-16 NOTE — Telephone Encounter (Signed)
Last ov was on 11/27/2014.   Thanks,

## 2015-08-20 NOTE — Telephone Encounter (Signed)
Please check with patient regarding request for detrol and ciprofloxacin. . Did symptoms resolve improve or resolve on these medications. If just having urinary frequency the detrol should help. If having any pain or soreness then he may need additional antiobiotic.

## 2015-08-21 NOTE — Telephone Encounter (Signed)
Advised pt as directed; he stated that his symptoms have improved and that he is not quiet there yet, but he feels like if he gets another round of both medications, he will get better.  Thanks,

## 2015-08-21 NOTE — Telephone Encounter (Signed)
Left message, okayed to leave detailed message for prescriptions sent to pharmacy.  Thanks,

## 2015-10-16 ENCOUNTER — Encounter: Payer: Self-pay | Admitting: Family Medicine

## 2015-10-16 ENCOUNTER — Ambulatory Visit (INDEPENDENT_AMBULATORY_CARE_PROVIDER_SITE_OTHER): Payer: Medicare Other | Admitting: Family Medicine

## 2015-10-16 VITALS — BP 146/90 | HR 76 | Temp 98.1°F | Resp 16 | Wt 203.8 lb

## 2015-10-16 DIAGNOSIS — J069 Acute upper respiratory infection, unspecified: Secondary | ICD-10-CM

## 2015-10-16 MED ORDER — HYDROCODONE-HOMATROPINE 5-1.5 MG/5ML PO SYRP: ORAL_SOLUTION | ORAL | Status: DC

## 2015-10-16 NOTE — Progress Notes (Signed)
Subjective:     Patient ID: Christian Mora, male   DOB: Mar 22, 1946, 69 y.o.   MRN: [id number]  HPI  Chief Complaint  Patient presents with  . Cough    Patient comes in office today with concerns of cough and congestion since 12/7, patient states that symptoms initially started out as sinus drainage. By Saturday 12/10 patient reports cold like symptoms such as : cough, chest congestion and nasal congestion. Patient denies being short of breath or wheezing he has tried taking otc cold medication.   Reports clear sinus congestion. Cough is occasionally productive.   Review of Systems  Constitutional: Negative for fever and chills.       Objective:   Physical Exam  Constitutional: He appears well-developed and well-nourished. No distress.  Ears: T.M's intact without inflammation Throat: tonsils absent, no erythema Neck: no cervical adenopathy Lungs: right  Posterior base wheeze     Assessment:    1. Upper respiratory infection - HYDROcodone-homatropine (HYCODAN) 5-1.5 MG/5ML syrup; 5 ml 4-6 hours as needed for cough  Dispense: 240 mL; Refill: 0    Plan:    Add expectorant, saline nasal spray and Claritin for sinus drip.

## 2015-10-16 NOTE — Patient Instructions (Addendum)
Add Mucinex or Robitussin as an expectorant. May use saline spray to help with sinus congestion. Claritin for sinus drip.

## 2015-10-26 DIAGNOSIS — H353212 Exudative age-related macular degeneration, right eye, with inactive choroidal neovascularization: Secondary | ICD-10-CM | POA: Diagnosis not present

## 2015-10-26 DIAGNOSIS — E119 Type 2 diabetes mellitus without complications: Secondary | ICD-10-CM | POA: Diagnosis not present

## 2015-11-01 ENCOUNTER — Other Ambulatory Visit: Payer: Self-pay | Admitting: Family Medicine

## 2015-11-28 DIAGNOSIS — Z85828 Personal history of other malignant neoplasm of skin: Secondary | ICD-10-CM | POA: Diagnosis not present

## 2015-11-28 DIAGNOSIS — D225 Melanocytic nevi of trunk: Secondary | ICD-10-CM | POA: Diagnosis not present

## 2015-11-28 DIAGNOSIS — X32XXXA Exposure to sunlight, initial encounter: Secondary | ICD-10-CM | POA: Diagnosis not present

## 2015-11-28 DIAGNOSIS — L570 Actinic keratosis: Secondary | ICD-10-CM | POA: Diagnosis not present

## 2015-11-28 DIAGNOSIS — D2261 Melanocytic nevi of right upper limb, including shoulder: Secondary | ICD-10-CM | POA: Diagnosis not present

## 2015-11-28 DIAGNOSIS — Z85820 Personal history of malignant melanoma of skin: Secondary | ICD-10-CM | POA: Diagnosis not present

## 2015-12-12 ENCOUNTER — Other Ambulatory Visit: Payer: Self-pay | Admitting: Family Medicine

## 2016-02-15 ENCOUNTER — Other Ambulatory Visit: Payer: Self-pay | Admitting: Family Medicine

## 2016-02-15 NOTE — Telephone Encounter (Signed)
Please call in alprazolam.  

## 2016-02-18 NOTE — Telephone Encounter (Signed)
Rx called in to pharmacy. 

## 2016-03-13 ENCOUNTER — Other Ambulatory Visit: Payer: Self-pay | Admitting: Family Medicine

## 2016-05-01 ENCOUNTER — Encounter: Payer: Self-pay | Admitting: Family Medicine

## 2016-05-01 ENCOUNTER — Ambulatory Visit (INDEPENDENT_AMBULATORY_CARE_PROVIDER_SITE_OTHER): Payer: Medicare Other | Admitting: Family Medicine

## 2016-05-01 VITALS — BP 160/98 | HR 82 | Temp 98.4°F | Resp 16 | Wt 201.0 lb

## 2016-05-01 DIAGNOSIS — H6981 Other specified disorders of Eustachian tube, right ear: Secondary | ICD-10-CM | POA: Diagnosis not present

## 2016-05-01 MED ORDER — FLUTICASONE PROPIONATE 50 MCG/ACT NA SUSP: 2.0000 | Freq: Every day | NASAL | Status: DC

## 2016-05-01 NOTE — Patient Instructions (Signed)
Give the spray a few days to work. Let me know if not improving.

## 2016-05-01 NOTE — Progress Notes (Signed)
Subjective:     Patient ID: Christian Mora, male   DOB: [birth year], 70 y.o.   MRN: [id number]  HPI  Chief Complaint  Patient presents with  . Ear Pain  Reports right ear fullness, decreased hearing and mild discomfort over the last 3 days. Has also noticed popping and crackling at times with mild discomfort down his posterior cervical area. Reports he does spend time outside and will get mild sinus congestion. Has a previous hx of allergic rhinitis documented.   Review of Systems     Objective:   Physical Exam  Constitutional: He appears well-developed and well-nourished. No distress.  HENT:  Bilateral TM's intact without inflammation. Right ear with small amount of wax buildup/no tragal tenderness  Musculoskeletal:  Cervical FROM without ear discomfort. No TMJ tenderness.       Assessment:    1. Eustachian tube dysfunction, righ - fluticasone (FLONASE) 50 MCG/ACT nasal spray; Place 2 sprays into both nostrils daily.  Dispense: 16 g; Refill: 0    Plan:    Discussed giving the spray a few days to work.

## 2016-05-27 ENCOUNTER — Other Ambulatory Visit: Payer: Self-pay | Admitting: Family Medicine

## 2016-05-27 DIAGNOSIS — H6981 Other specified disorders of Eustachian tube, right ear: Secondary | ICD-10-CM

## 2016-05-27 MED ORDER — FLUTICASONE PROPIONATE 50 MCG/ACT NA SUSP: 2.0000 | Freq: Every day | NASAL | 2 refills | Status: DC

## 2016-06-09 DIAGNOSIS — L570 Actinic keratosis: Secondary | ICD-10-CM | POA: Diagnosis not present

## 2016-06-09 DIAGNOSIS — L820 Inflamed seborrheic keratosis: Secondary | ICD-10-CM | POA: Diagnosis not present

## 2016-06-09 DIAGNOSIS — L859 Epidermal thickening, unspecified: Secondary | ICD-10-CM | POA: Diagnosis not present

## 2016-06-09 DIAGNOSIS — D485 Neoplasm of uncertain behavior of skin: Secondary | ICD-10-CM | POA: Diagnosis not present

## 2016-06-09 DIAGNOSIS — L309 Dermatitis, unspecified: Secondary | ICD-10-CM | POA: Diagnosis not present

## 2016-06-09 DIAGNOSIS — X32XXXA Exposure to sunlight, initial encounter: Secondary | ICD-10-CM | POA: Diagnosis not present

## 2016-06-17 DIAGNOSIS — L089 Local infection of the skin and subcutaneous tissue, unspecified: Secondary | ICD-10-CM | POA: Diagnosis not present

## 2016-07-18 ENCOUNTER — Ambulatory Visit (INDEPENDENT_AMBULATORY_CARE_PROVIDER_SITE_OTHER): Payer: Medicare Other | Admitting: Family Medicine

## 2016-07-18 ENCOUNTER — Encounter: Payer: Self-pay | Admitting: Family Medicine

## 2016-07-18 VITALS — BP 152/82 | HR 72 | Temp 98.1°F | Resp 18 | Wt 206.0 lb

## 2016-07-18 DIAGNOSIS — J40 Bronchitis, not specified as acute or chronic: Secondary | ICD-10-CM | POA: Diagnosis not present

## 2016-07-18 DIAGNOSIS — I10 Essential (primary) hypertension: Secondary | ICD-10-CM | POA: Diagnosis not present

## 2016-07-18 DIAGNOSIS — E785 Hyperlipidemia, unspecified: Secondary | ICD-10-CM | POA: Diagnosis not present

## 2016-07-18 DIAGNOSIS — E039 Hypothyroidism, unspecified: Secondary | ICD-10-CM

## 2016-07-18 MED ORDER — AZITHROMYCIN 250 MG PO TABS: ORAL_TABLET | ORAL | 0 refills | Status: AC

## 2016-07-18 NOTE — Progress Notes (Signed)
[name] Male    DOB: [birth year]   70 y.o.   MRN: [id number] Visit Date: 07/18/2016  Today's Provider: Lelon Huh, MD   Chief Complaint  Patient presents with  . URI   Subjective:    URI   This is a new problem. Episode onset: 1 week ago. The problem has been gradually improving. There has been no fever. Associated symptoms include chest pain (tightness in his chest), congestion, coughing (productive with green phlegm), a plugged ear sensation (right ear), rhinorrhea, sneezing, a sore throat (resolved) and wheezing. Pertinent negatives include no abdominal pain, diarrhea, dysuria, ear pain, headaches, joint pain, joint swelling, nausea, neck pain, sinus pain or vomiting. Treatments tried: Mucinex and Flonse. The treatment provided mild relief.  Patient feels the right side of his chest is more congested than the left side.    He also requests follow up of blood pressure and cholesterol medication. He is doing well with medications with no adverse effects. No chest pain or heart flutters aside from soreness associated with current cough. He would like to have labs drawn next week.     No Known Allergies   Current Outpatient Prescriptions:  .  ALPRAZolam (XANAX) 0.5 MG tablet, TAKE 1/2 TO 1 TABLET 3 TIMES A DAY AS NEEDED FOR ANXIETY, Disp: 60 tablet, Rfl: 5 .  aspirin 81 MG tablet, Take 1 tablet by mouth daily., Disp: , Rfl:  .  fluticasone (FLONASE) 50 MCG/ACT nasal spray, Place 2 sprays into both nostrils daily., Disp: 16 g, Rfl: 2 .  HYDROcodone-homatropine (HYCODAN) 5-1.5 MG/5ML syrup, 5 ml 4-6 hours as needed for cough, Disp: 240 mL, Rfl: 0 .  levothyroxine (SYNTHROID, LEVOTHROID) 112 MCG tablet, TAKE ONE TABLET BY MOUTH EVERY DAY, Disp: 90 tablet, Rfl: 2 .  lovastatin (MEVACOR) 20 MG tablet, TAKE ONE TABLET BY MOUTH EVERY DAY, Disp: 90 tablet, Rfl: 4 .  MULTIPLE VITAMIN PO, Take 1 tablet by mouth daily., Disp: , Rfl:  .  omeprazole (PRILOSEC) 20 MG  capsule, TAKE 1 TO 2 CAPSULES BY MOUTH EVERY DAY AS NEEDED FOR HEARTBURN, Disp: 60 capsule, Rfl: 6 .  PROCTOSOL HC 2.5 % rectal cream, APPLY 2 OR 3 TIMES DAILY AS NEEDED, Disp: 28.35 g, Rfl: 5 .  tamsulosin (FLOMAX) 0.4 MG CAPS capsule, Take 1 capsule (0.4 mg total) by mouth daily., Disp: 90 capsule, Rfl: 4  Review of Systems  Constitutional: Positive for fatigue. Negative for appetite change, chills and fever.  HENT: Positive for congestion, rhinorrhea, sneezing and sore throat (resolved). Negative for ear pain.   Respiratory: Positive for cough (productive with green phlegm), chest tightness and wheezing. Negative for shortness of breath.   Cardiovascular: Positive for chest pain (tightness in his chest). Negative for palpitations.  Gastrointestinal: Negative for abdominal pain, diarrhea, nausea and vomiting.  Genitourinary: Negative for dysuria.  Musculoskeletal: Negative for joint pain and neck pain.  Neurological: Negative for headaches.    Social History  Substance Use Topics  . Smoking status: Never Smoker  . Smokeless tobacco: Never Used  . Alcohol use No   Objective:   BP (!) 152/82 (BP Location: Left Arm, Patient Position: Sitting, Cuff Size: Large)   Pulse 72   Temp 98.1 F (36.7 C) (Oral)   Resp 18   Wt 206 lb (93.4 kg)   SpO2 98% Comment: room air  BMI 34.28 kg/m   Physical Exam  General Appearance:    Alert, cooperative, no distress  HENT:  bilateral TM normal without fluid or infection, neck without nodes, throat normal without erythema or exudate, pharynx erythematous without exudate, sinuses nontender, post nasal drip noted and nasal mucosa pale and congested  Eyes:    PERRL, conjunctiva/corneas clear, EOM's intact       Lungs:     Occasional expiratory wheeze, no rales, , respirations unlabored  Heart:    Regular rate and rhythm  Neurologic:   Awake, alert, oriented x 3. No apparent focal neurological           defect.           Assessment & Plan:       1. Bronchitis Call if symptoms change or if not rapidly improving.    - azithromycin (ZITHROMAX) 250 MG tablet; 2 by mouth today, then 1 daily for 4 days  Dispense: 6 tablet; Refill: 0  2. Hypothyroidism, unspecified hypothyroidism type  - TSH  3. Essential (primary) hypertension BP up today, may be affected by acute illness. He is going to return for AWV in a few months, if not better will start medications.   - Renal function panel  4. HLD (hyperlipidemia) He is tolerating lovastatin well with no adverse effects.   - Lipid panel - Hepatic function panel       Lelon Huh, MD  [facility]

## 2016-07-22 DIAGNOSIS — I10 Essential (primary) hypertension: Secondary | ICD-10-CM | POA: Diagnosis not present

## 2016-07-22 DIAGNOSIS — E039 Hypothyroidism, unspecified: Secondary | ICD-10-CM | POA: Diagnosis not present

## 2016-07-22 DIAGNOSIS — E785 Hyperlipidemia, unspecified: Secondary | ICD-10-CM | POA: Diagnosis not present

## 2016-07-23 ENCOUNTER — Telehealth: Payer: Self-pay

## 2016-07-23 LAB — RENAL FUNCTION PANEL
Albumin: 4.4 g/dL (ref 3.5–4.8)
BUN / CREAT RATIO: 10 (ref 10–24)
BUN: 11 mg/dL (ref 8–27)
CO2: 25 mmol/L (ref 18–29)
CREATININE: 1.06 mg/dL (ref 0.76–1.27)
Calcium: 9 mg/dL (ref 8.6–10.2)
Chloride: 101 mmol/L (ref 96–106)
GFR calc Af Amer: 82 mL/min/{1.73_m2} (ref >59)
GFR, EST NON AFRICAN AMERICAN: 71 mL/min/{1.73_m2} (ref >59)
GLUCOSE: 94 mg/dL (ref 65–99)
PHOSPHORUS: 3.3 mg/dL (ref 2.5–4.5)
Potassium: 4 mmol/L (ref 3.5–5.2)
Sodium: 141 mmol/L (ref 134–144)

## 2016-07-23 LAB — HEPATIC FUNCTION PANEL
ALK PHOS: 59 IU/L (ref 39–117)
ALT: 14 IU/L (ref 0–44)
AST: 20 IU/L (ref 0–40)
Bilirubin Total: 0.6 mg/dL (ref 0.0–1.2)
Bilirubin, Direct: 0.15 mg/dL (ref 0.00–0.40)
TOTAL PROTEIN: 7.1 g/dL (ref 6.0–8.5)

## 2016-07-23 LAB — LIPID PANEL
CHOL/HDL RATIO: 4.1 ratio (ref 0.0–5.0)
Cholesterol, Total: 143 mg/dL (ref 100–199)
HDL: 35 mg/dL — AB (ref >39)
LDL Calculated: 78 mg/dL (ref 0–99)
TRIGLYCERIDES: 150 mg/dL — AB (ref 0–149)
VLDL Cholesterol Cal: 30 mg/dL (ref 5–40)

## 2016-07-23 LAB — TSH: TSH: 6.47 u[IU]/mL — ABNORMAL HIGH (ref 0.450–4.500)

## 2016-07-23 MED ORDER — LEVOTHYROXINE SODIUM 125 MCG PO TABS: 125.0000 ug | ORAL_TABLET | Freq: Every day | ORAL | 4 refills | Status: DC

## 2016-07-23 NOTE — Telephone Encounter (Signed)
-----   Message from Birdie Sons, MD sent at 07/23/2016  7:52 AM EDT ----- Cholesterol is good at 143. Need to increase levothyroxine to 125 mcg, #30, rf x 4. Continue all other meds unchanged. Follow up in 4 months.

## 2016-07-23 NOTE — Telephone Encounter (Signed)
Advised pt of lab results. Pt verbally acknowledges understanding. Emily Drozdowski, CMA   

## 2016-07-30 ENCOUNTER — Other Ambulatory Visit: Payer: Self-pay | Admitting: Family Medicine

## 2016-07-30 DIAGNOSIS — N410 Acute prostatitis: Secondary | ICD-10-CM

## 2016-08-12 DIAGNOSIS — Z23 Encounter for immunization: Secondary | ICD-10-CM | POA: Diagnosis not present

## 2016-09-02 ENCOUNTER — Other Ambulatory Visit: Payer: Self-pay | Admitting: Family Medicine

## 2016-09-02 MED ORDER — ALPRAZOLAM 0.5 MG PO TABS: ORAL_TABLET | ORAL | 5 refills | Status: DC

## 2016-09-02 NOTE — Telephone Encounter (Signed)
Please call in alprazolam.  

## 2016-09-02 NOTE — Telephone Encounter (Signed)
Rx called in to pharmacy. 

## 2016-09-12 DIAGNOSIS — Z85828 Personal history of other malignant neoplasm of skin: Secondary | ICD-10-CM | POA: Diagnosis not present

## 2016-09-12 DIAGNOSIS — Z85820 Personal history of malignant melanoma of skin: Secondary | ICD-10-CM | POA: Diagnosis not present

## 2016-09-12 DIAGNOSIS — D2261 Melanocytic nevi of right upper limb, including shoulder: Secondary | ICD-10-CM | POA: Diagnosis not present

## 2016-09-12 DIAGNOSIS — L570 Actinic keratosis: Secondary | ICD-10-CM | POA: Diagnosis not present

## 2016-10-29 ENCOUNTER — Other Ambulatory Visit: Payer: Self-pay | Admitting: Family Medicine

## 2016-11-13 ENCOUNTER — Other Ambulatory Visit: Payer: Self-pay | Admitting: Family Medicine

## 2016-11-21 ENCOUNTER — Ambulatory Visit: Payer: Self-pay | Admitting: Family Medicine

## 2016-11-29 ENCOUNTER — Ambulatory Visit (INDEPENDENT_AMBULATORY_CARE_PROVIDER_SITE_OTHER): Payer: Medicare Other | Admitting: Family Medicine

## 2016-11-29 ENCOUNTER — Encounter: Payer: Self-pay | Admitting: Family Medicine

## 2016-11-29 VITALS — BP 150/78 | HR 97 | Temp 99.3°F | Resp 20 | Wt 204.0 lb

## 2016-11-29 DIAGNOSIS — J111 Influenza due to unidentified influenza virus with other respiratory manifestations: Secondary | ICD-10-CM | POA: Diagnosis not present

## 2016-11-29 MED ORDER — HYDROCODONE-HOMATROPINE 5-1.5 MG/5ML PO SYRP: ORAL_SOLUTION | ORAL | 0 refills | Status: DC

## 2016-11-29 MED ORDER — OSELTAMIVIR PHOSPHATE 75 MG PO CAPS
75.0000 mg | ORAL_CAPSULE | Freq: Two times a day (BID) | ORAL | 0 refills | Status: DC
Start: 2016-11-29 — End: 2016-12-10

## 2016-11-29 NOTE — Patient Instructions (Addendum)
Continue decongestant/expectorant. Let us know if not improving over the next week.

## 2016-11-29 NOTE — Progress Notes (Signed)
Subjective:     Patient ID: Christian Mora, male   DOB: May 03, 1946, 71 y.o.   MRN: [id number]  HPI  Chief Complaint  Patient presents with  . Cough    x 3 days  Reports flu like sx over the last 48 hours. + flu shot. Has been using an otc decongestant/expectorant.   Review of Systems     Objective:   Physical Exam  Constitutional: He appears well-developed and well-nourished. No distress.  Ears: T.M's intact without inflammation Throat: no tonsillar enlargement or exudate Neck: no cervical adenopathy Lungs: clear     Assessment:    1. Flu - oseltamivir (TAMIFLU) 75 MG capsule; Take 1 capsule (75 mg total) by mouth 2 (two) times daily.  Dispense: 10 capsule; Refill: 0 - HYDROcodone-homatropine (HYCODAN) 5-1.5 MG/5ML syrup; 5 ml 4-6 hours as needed for cough  Dispense: 240 mL; Refill: 0    Plan:    Continue to use otc expectorant/decongestant.

## 2016-12-10 ENCOUNTER — Ambulatory Visit
Admission: RE | Admit: 2016-12-10 | Discharge: 2016-12-10 | Disposition: A | Discharge status: Home / Self Care | Payer: Medicare Other | Source: Ambulatory Visit | Attending: Family Medicine | Admitting: Family Medicine

## 2016-12-10 ENCOUNTER — Encounter: Payer: Self-pay | Admitting: Family Medicine

## 2016-12-10 ENCOUNTER — Ambulatory Visit (INDEPENDENT_AMBULATORY_CARE_PROVIDER_SITE_OTHER): Payer: Medicare Other | Admitting: Family Medicine

## 2016-12-10 VITALS — BP 160/80 | HR 68 | Temp 99.1°F | Resp 18 | Wt 203.0 lb

## 2016-12-10 DIAGNOSIS — N390 Urinary tract infection, site not specified: Secondary | ICD-10-CM

## 2016-12-10 DIAGNOSIS — R05 Cough: Secondary | ICD-10-CM | POA: Diagnosis not present

## 2016-12-10 DIAGNOSIS — Z1159 Encounter for screening for other viral diseases: Secondary | ICD-10-CM

## 2016-12-10 DIAGNOSIS — R079 Chest pain, unspecified: Secondary | ICD-10-CM

## 2016-12-10 DIAGNOSIS — I10 Essential (primary) hypertension: Secondary | ICD-10-CM | POA: Diagnosis not present

## 2016-12-10 DIAGNOSIS — Z125 Encounter for screening for malignant neoplasm of prostate: Secondary | ICD-10-CM | POA: Diagnosis not present

## 2016-12-10 DIAGNOSIS — R300 Dysuria: Secondary | ICD-10-CM | POA: Diagnosis not present

## 2016-12-10 DIAGNOSIS — K449 Diaphragmatic hernia without obstruction or gangrene: Secondary | ICD-10-CM | POA: Diagnosis not present

## 2016-12-10 DIAGNOSIS — E039 Hypothyroidism, unspecified: Secondary | ICD-10-CM

## 2016-12-10 LAB — POCT URINALYSIS DIPSTICK
Bilirubin, UA: NEGATIVE
Blood, UA: NEGATIVE
Glucose, UA: NEGATIVE
Ketones, UA: NEGATIVE
Leukocytes, UA: NEGATIVE
Nitrite, UA: POSITIVE
PROTEIN UA: NEGATIVE
SPEC GRAV UA: 1.01
UROBILINOGEN UA: 0.2
pH, UA: 6

## 2016-12-10 NOTE — Progress Notes (Signed)
[name] Male    DOB: 1946-01-21   71 y.o.   MRN: [id number] Visit Date: 12/10/2016  Today's Provider: Lelon Huh, MD   Chief Complaint  Patient presents with  . Hypertension    follow up  . Hypothyroidism    follow up   Subjective:    HPI  Hypertension, follow-up:  BP Readings from Last 3 Encounters:  11/29/16 (!) 150/78  07/18/16 (!) 152/82  05/01/16 (!) 160/98    He was last seen for hypertension 5 months ago.  BP at that visit was 152/82. Management since that visit includes no changes. He reports good compliance with treatment. He is not having side effects.  He is exercising. He is adherent to low salt diet.   Outside blood pressures are not being checked. He is experiencing chest pain.  Patient denies chest pain, orthopnea, palpitations, syncope and tachypnea.     Weight trend: fluctuating a bit Wt Readings from Last 3 Encounters:  11/29/16 204 lb (92.5 kg)  07/18/16 206 lb (93.4 kg)  05/01/16 201 lb (91.2 kg)    Current diet: in general, an "unhealthy" diet  ------------------------------------------------------------------------ Follow up Hypothyroidism: Patient was last seen for this problem 5 months ago.  Lab Results  Component Value Date   TSH 6.470 (H) 07/22/2016   Changes made during that visit includes increasing Levothyroxine to 138mcg daily. Patient reports good compliance with treatment and  good tolerance.   Patient also has multiple acute complaints he wants to address today.  He states that yesterday he started having painful sensation of urethra when urinating. No discharge. No other exacerbating factors. No abdominal pains.   He also complains of pain on right side of chest for the last three days. He was seen 11/30/2016 for cough and flu symptoms and prescribed Tamiflu. Those symptoms mostly resolved but started developing pain along strip from right mid chest across flank and back. Seems to get a little worse  if he takes a deep breath. Has had a few chills. No dyspnea. No diaphoreses.   He also requests routine screening blood work including PSA and hepatis C screening.     No Known Allergies   Current Outpatient Prescriptions:  .  ALPRAZolam (XANAX) 0.5 MG tablet, TAKE 1/2 TO 1 TABLET 3 TIMES A DAY AS NEEDED FOR ANXIETY, Disp: 60 tablet, Rfl: 5 .  aspirin 81 MG tablet, Take 1 tablet by mouth daily., Disp: , Rfl:  .  fluticasone (FLONASE) 50 MCG/ACT nasal spray, Place 2 sprays into both nostrils daily., Disp: 16 g, Rfl: 2 .  HYDROcodone-homatropine (HYCODAN) 5-1.5 MG/5ML syrup, 5 ml 4-6 hours as needed for cough, Disp: 240 mL, Rfl: 0 .  levothyroxine (SYNTHROID, LEVOTHROID) 125 MCG tablet, TAKE ONE TABLET BY MOUTH EVERY DAY, Disp: 30 tablet, Rfl: 3 .  lovastatin (MEVACOR) 20 MG tablet, TAKE ONE TABLET BY MOUTH EVERY DAY, Disp: 90 tablet, Rfl: 4 .  MULTIPLE VITAMIN PO, Take 1 tablet by mouth daily., Disp: , Rfl:  .  omeprazole (PRILOSEC) 20 MG capsule, TAKE 1 TO 2 CAPSULES BY MOUTH EVERY DAY AS NEEDED FOR HEARTBURN, Disp: 60 capsule, Rfl: 6 .  PROCTO-MED HC 2.5 % rectal cream, APPLY 2 OR 3 TIMES A DAY AS NEEDED, Disp: 30 g, Rfl: 5 .  tamsulosin (FLOMAX) 0.4 MG CAPS capsule, TAKE 1 CAPSULE BY MOUTH EVERY DAY, Disp: 90 capsule, Rfl: 4  Review of Systems  Constitutional: Positive for chills. Negative for appetite change  and fever.  Respiratory: Negative for chest tightness, shortness of breath and wheezing.   Cardiovascular: Positive for chest pain (sorness on the right side of his chest; worsens when deep breathing). Negative for palpitations.  Gastrointestinal: Negative for abdominal pain, nausea and vomiting.  Genitourinary: Positive for dysuria (started this morning).  Musculoskeletal: Positive for myalgias (started last night).    Social History  Substance Use Topics  . Smoking status: Never Smoker  . Smokeless tobacco: Never Used  . Alcohol use No   Objective:   BP (!) 160/80 (BP  Location: Left Arm, Patient Position: Sitting, Cuff Size: Large)   Pulse 68   Temp 99.1 F (37.3 C) (Oral)   Resp 18   Wt 203 lb (92.1 kg)   SpO2 99% Comment: room air  BMI 33.78 kg/m   Physical Exam   General Appearance:    Alert, cooperative, no distress  Eyes:    PERRL, conjunctiva/corneas clear, EOM's intact       Lungs:     Clear to auscultation bilaterally, respirations unlabored  Heart:    Regular rate and rhythm  Neurologic:   Awake, alert, oriented x 3. No apparent focal neurological           defect.   MS:   Tender along intercostal space of right anterior, lateral and posterior ribs. No rashes.    Dg Chest 2 View  Result Date: 12/10/2016 CLINICAL DATA:  Recent bout of the flu, now with right sided lower chest pain and cough EXAM: CHEST  2 VIEW COMPARISON:  Chest x-ray of 04/13/2014 FINDINGS: No active infiltrate or effusion is seen. Mediastinal and hilar contours are unremarkable. The heart is within normal limits in size for age. A moderate size hiatal hernia is noted. There are degenerative changes throughout the thoracic spine. IMPRESSION: 1. No active cardiopulmonary disease. 2. Stable moderate size hiatal hernia. Electronically Signed   By: Ivar Drape M.D.   On: 12/10/2016 17:18       Assessment & Plan:     1. Dysuria Likely non-infectious urethritis. Urine sent for culture. Start applying neosporin ointment.  - POCT Urinalysis Dipstick  2. Adult hypothyroidism Doing well since increase levothyroxine to 113mcg - TSH - CBC  3. Essential (primary) hypertension He reports home blood pressure in 120s/70s and does not want start medication due to history of adverse reactions  - CBC  4. Rule Out Urinary tract infection without hematuria, site unspecified  - Urine Culture  5. Need for hepatitis C screening test  - Hepatitis C antibody  6. Prostate cancer screening  - PSA  7. Chest pain, unspecified type Likely costochondritis and will prescribe naprosyn  500 bid. Also counseled that considering distribution, this could be presenting symptom for zoster and to call if he develops any type of rash or skin lesions.  - DG Chest 2 View; Future  Addressed extensive list of chronic and acute medical problems today requiring extensive time in counseling and coordination care.  Over half of this 45 minute visit were spent in counseling and coordinating care of multiple medical problems.       Lelon Huh, MD  [facility]

## 2016-12-11 ENCOUNTER — Telehealth: Payer: Self-pay

## 2016-12-11 LAB — CBC
Hematocrit: 40.5 % (ref 37.5–51.0)
Hemoglobin: 12.6 g/dL — ABNORMAL LOW (ref 13.0–17.7)
MCH: 26 pg — ABNORMAL LOW (ref 26.6–33.0)
MCHC: 31.1 g/dL — ABNORMAL LOW (ref 31.5–35.7)
MCV: 84 fL (ref 79–97)
PLATELETS: 211 10*3/uL (ref 150–379)
RBC: 4.85 x10E6/uL (ref 4.14–5.80)
RDW: 16.4 % — AB (ref 12.3–15.4)
WBC: 6.7 10*3/uL (ref 3.4–10.8)

## 2016-12-11 LAB — HEPATITIS C ANTIBODY: Hep C Virus Ab: 0.7 s/co ratio (ref 0.0–0.9)

## 2016-12-11 LAB — PSA: Prostate Specific Ag, Serum: 0.7 ng/mL (ref 0.0–4.0)

## 2016-12-11 LAB — URINE CULTURE: ORGANISM ID, BACTERIA: NO GROWTH

## 2016-12-11 LAB — TSH: TSH: 3.79 u[IU]/mL (ref 0.450–4.500)

## 2016-12-11 MED ORDER — LEVOTHYROXINE SODIUM 125 MCG PO TABS: 125.0000 ug | ORAL_TABLET | Freq: Every day | ORAL | 12 refills | Status: DC

## 2016-12-11 MED ORDER — CIPROFLOXACIN HCL 500 MG PO TABS: 500.0000 mg | ORAL_TABLET | Freq: Two times a day (BID) | ORAL | 0 refills | Status: AC

## 2016-12-11 MED ORDER — NAPROXEN 500 MG PO TABS: 500.0000 mg | ORAL_TABLET | Freq: Two times a day (BID) | ORAL | 3 refills | Status: DC

## 2016-12-11 NOTE — Telephone Encounter (Signed)
Pt advised. Emily Drozdowski, CMA  

## 2016-12-11 NOTE — Telephone Encounter (Signed)
-----   Message from Birdie Sons, MD sent at 12/11/2016  7:54 AM EST ----- Thyroid functions normal. Normal cbc, PSA and negative hepatitis C panel. Continue current medications.

## 2016-12-11 NOTE — Telephone Encounter (Signed)
Have sent prescription for cipro to [facility]. Call if symptoms not resolved with finished.

## 2016-12-11 NOTE — Telephone Encounter (Signed)
Pt advised. Is requesting refill on levothyroxine. [facility]. Renaldo Fiddler, CMA

## 2016-12-11 NOTE — Telephone Encounter (Signed)
lmtcb Locklyn Henriquez Drozdowski, CMA  

## 2016-12-11 NOTE — Telephone Encounter (Signed)
-----   Message from Birdie Sons, MD sent at 12/10/2016  5:27 PM EST ----- Xray is completely normal. If still have pain or soreness then recommend naprosyn 500mg  twice daily as needed, #30, rf x 3

## 2016-12-11 NOTE — Telephone Encounter (Signed)
Pt advised of CXR results and naproxen sent to pharmacy. Pt is concerned of a possible prostate infection. Is c/o dysuria, discomfort and fever. Pt states this is similar to past prostate infections. Advised he may need to be seen for OV. He is requesting to be treated over the phone if possible. Has used Cipro and Detrol LA in the past for the spasms. [facility]. Please advise. CB# [phone]. Renaldo Fiddler, CMA

## 2016-12-15 ENCOUNTER — Ambulatory Visit: Payer: Self-pay | Admitting: Family Medicine

## 2017-01-23 ENCOUNTER — Other Ambulatory Visit: Payer: Self-pay | Admitting: Family Medicine

## 2017-02-10 ENCOUNTER — Other Ambulatory Visit: Payer: Self-pay | Admitting: Family Medicine

## 2017-02-25 ENCOUNTER — Encounter: Payer: Self-pay | Admitting: Family Medicine

## 2017-02-25 ENCOUNTER — Ambulatory Visit (INDEPENDENT_AMBULATORY_CARE_PROVIDER_SITE_OTHER): Payer: Medicare Other | Admitting: Family Medicine

## 2017-02-25 VITALS — BP 140/88 | HR 76 | Temp 98.5°F | Resp 16 | Wt 209.0 lb

## 2017-02-25 DIAGNOSIS — M545 Low back pain: Secondary | ICD-10-CM | POA: Diagnosis not present

## 2017-02-25 DIAGNOSIS — R3911 Hesitancy of micturition: Secondary | ICD-10-CM

## 2017-02-25 DIAGNOSIS — N401 Enlarged prostate with lower urinary tract symptoms: Secondary | ICD-10-CM | POA: Diagnosis not present

## 2017-02-25 DIAGNOSIS — N410 Acute prostatitis: Secondary | ICD-10-CM

## 2017-02-25 LAB — POCT URINALYSIS DIPSTICK
Bilirubin, UA: NEGATIVE
GLUCOSE UA: NEGATIVE
Ketones, UA: NEGATIVE
Leukocytes, UA: NEGATIVE
NITRITE UA: NEGATIVE
Protein, UA: NEGATIVE
RBC UA: NEGATIVE
Spec Grav, UA: 1.01 (ref 1.010–1.025)
Urobilinogen, UA: 0.2 E.U./dL
pH, UA: 6 (ref 5.0–8.0)

## 2017-02-25 MED ORDER — CIPROFLOXACIN HCL 500 MG PO TABS: 500.0000 mg | ORAL_TABLET | Freq: Two times a day (BID) | ORAL | 0 refills | Status: DC

## 2017-02-25 NOTE — Patient Instructions (Signed)
You can increase tamsulosin twice a day until symptoms improve.

## 2017-02-25 NOTE — Progress Notes (Signed)
[name] Male    DOB: 02-Jul-1946   71 y.o.   MRN: [id number] Visit Date: 02/25/2017  Today's Provider: Lelon Huh, MD   Chief Complaint  Patient presents with  . Back Pain   Subjective:    Patient has been having urinary frequency for 1 week. Patient also having symptoms of low back pain and pressure in bladder area. Patient has been taking naproxen and ibuprofen with mild relief.    Urinary Frequency   This is a new problem. The current episode started in the past 7 days. The problem occurs every urination. The problem has been unchanged. The quality of the pain is described as burning, shooting, stabbing and aching. The pain is moderate. There has been no fever. Associated symptoms include flank pain, frequency and urgency. Pertinent negatives include no chills, discharge, hematuria, hesitancy, nausea, possible pregnancy, sweats or vomiting. He has tried NSAIDs for the symptoms. The treatment provided mild relief.   He has long history of recurrent prostatitis which usually responds will to long course of cipro    No Known Allergies   Current Outpatient Prescriptions:  .  ALPRAZolam (XANAX) 0.5 MG tablet, TAKE 1/2 TO 1 TABLET 3 TIMES A DAY AS NEEDED FOR ANXIETY, Disp: 60 tablet, Rfl: 5 .  aspirin 81 MG tablet, Take 1 tablet by mouth daily., Disp: , Rfl:  .  fluticasone (FLONASE) 50 MCG/ACT nasal spray, Place 2 sprays into both nostrils daily., Disp: 16 g, Rfl: 2 .  levothyroxine (SYNTHROID, LEVOTHROID) 125 MCG tablet, Take 1 tablet (125 mcg total) by mouth daily., Disp: 30 tablet, Rfl: 12 .  lovastatin (MEVACOR) 20 MG tablet, TAKE ONE TABLET BY MOUTH EVERY DAY, Disp: 90 tablet, Rfl: 4 .  MULTIPLE VITAMIN PO, Take 1 tablet by mouth daily., Disp: , Rfl:  .  naproxen (NAPROSYN) 500 MG tablet, TAKE ONE TABLET TWICE DAILY WITH A MEAL, Disp: 30 tablet, Rfl: 5 .  omeprazole (PRILOSEC) 20 MG capsule, TAKE 1 TO 2 CAPSULES EVERY DAY AS NEEDEDFOR HEARTBURN, Disp: 60  capsule, Rfl: 12 .  Probiotic Product (PROBIOTIC PO), Take by mouth., Disp: , Rfl:  .  PROCTO-MED HC 2.5 % rectal cream, APPLY 2 OR 3 TIMES A DAY AS NEEDED, Disp: 30 g, Rfl: 5 .  tamsulosin (FLOMAX) 0.4 MG CAPS capsule, TAKE 1 CAPSULE BY MOUTH EVERY DAY, Disp: 90 capsule, Rfl: 4  Review of Systems  Constitutional: Negative for appetite change and chills.  Respiratory: Negative for chest tightness, shortness of breath and wheezing.   Cardiovascular: Negative for palpitations.  Gastrointestinal: Negative for nausea and vomiting.  Genitourinary: Positive for flank pain, frequency and urgency. Negative for hematuria and hesitancy.  Musculoskeletal: Positive for back pain.    Social History  Substance Use Topics  . Smoking status: Never Smoker  . Smokeless tobacco: Never Used  . Alcohol use No   Objective:   BP 140/88 (BP Location: Left Arm, Patient Position: Sitting, Cuff Size: Large)   Pulse 76   Temp 98.5 F (36.9 C) (Oral)   Resp 16   Wt 209 lb (94.8 kg)   SpO2 96%   BMI 34.78 kg/m  Vitals:   02/25/17 1648  BP: 140/88  Pulse: 76  Resp: 16  Temp: 98.5 F (36.9 C)  TempSrc: Oral  SpO2: 96%  Weight: 209 lb (94.8 kg)     Physical Exam  GU: Enlarged boggy prostate, slightly larger on left. No nodules.   Results for orders  placed or performed in visit on 02/25/17  POCT urinalysis dipstick  Result Value Ref Range   Color, UA Yellow    Clarity, UA Clear    Glucose, UA neg    Bilirubin, UA Neg    Ketones, UA Neg    Spec Grav, UA 1.010 1.010 - 1.025   Blood, UA Neg    pH, UA 6.0 5.0 - 8.0   Protein, UA Neg    Urobilinogen, UA 0.2 0.2 or 1.0 E.U./dL   Nitrite, UA Neg    Leukocytes, UA Negative Negative       Assessment & Plan:     1. Acute prostatitis  - ciprofloxacin (CIPRO) 500 MG tablet; Take 1 tablet (500 mg total) by mouth 2 (two) times daily.  Dispense: 42 tablet; Refill: 0  2. Low back pain, unspecified back pain laterality, unspecified chronicity,  with sciatica presence unspecified Possibly due to urinary retention.  - POCT urinalysis dipstick  3. Benign prostatic hyperplasia with urinary hesitancy May increase tamsulosin to BID for the time being. Caution on orthostasis precaustions.   4. Urinary hesitancy Consider urology referral if not steadily improving on antibiotic and increased dose of alpha blocker.        Lelon Huh, MD  [facility]

## 2017-03-02 DIAGNOSIS — H43813 Vitreous degeneration, bilateral: Secondary | ICD-10-CM | POA: Diagnosis not present

## 2017-03-19 ENCOUNTER — Other Ambulatory Visit: Payer: Self-pay | Admitting: Family Medicine

## 2017-03-20 NOTE — Telephone Encounter (Signed)
Please call in alprazolam.  

## 2017-03-20 NOTE — Telephone Encounter (Signed)
Rx called in to pharmacy. 

## 2017-03-31 DIAGNOSIS — S50861A Insect bite (nonvenomous) of right forearm, initial encounter: Secondary | ICD-10-CM | POA: Diagnosis not present

## 2017-03-31 DIAGNOSIS — D692 Other nonthrombocytopenic purpura: Secondary | ICD-10-CM | POA: Diagnosis not present

## 2017-05-22 ENCOUNTER — Other Ambulatory Visit: Payer: Self-pay | Admitting: Family Medicine

## 2017-05-22 DIAGNOSIS — H6981 Other specified disorders of Eustachian tube, right ear: Secondary | ICD-10-CM

## 2017-05-22 MED ORDER — FLUTICASONE PROPIONATE 50 MCG/ACT NA SUSP: 2.0000 | Freq: Every day | NASAL | 11 refills | Status: DC

## 2017-05-22 NOTE — Telephone Encounter (Signed)
[facility] faxed a request on the following medication. Thanks CC  fluticasone (FLONASE) 50 MCG/ACT nasal spray  >Place 2 sprays into both nostrils daily.

## 2017-05-25 ENCOUNTER — Encounter: Payer: Self-pay | Admitting: Family Medicine

## 2017-05-25 ENCOUNTER — Ambulatory Visit (INDEPENDENT_AMBULATORY_CARE_PROVIDER_SITE_OTHER): Payer: Medicare Other | Admitting: Family Medicine

## 2017-05-25 VITALS — BP 140/80 | HR 81 | Temp 98.4°F | Resp 3 | Wt 203.0 lb

## 2017-05-25 DIAGNOSIS — R350 Frequency of micturition: Secondary | ICD-10-CM | POA: Diagnosis not present

## 2017-05-25 DIAGNOSIS — N410 Acute prostatitis: Secondary | ICD-10-CM

## 2017-05-25 LAB — POCT URINALYSIS DIPSTICK
Bilirubin, UA: NEGATIVE
Glucose, UA: NEGATIVE
Leukocytes, UA: NEGATIVE
NITRITE UA: NEGATIVE
PROTEIN UA: NEGATIVE
SPEC GRAV UA: 1.015 (ref 1.010–1.025)
Urobilinogen, UA: 0.2 E.U./dL
pH, UA: 6 (ref 5.0–8.0)

## 2017-05-25 MED ORDER — CIPROFLOXACIN HCL 500 MG PO TABS: 500.0000 mg | ORAL_TABLET | Freq: Two times a day (BID) | ORAL | 0 refills | Status: DC

## 2017-05-25 NOTE — Progress Notes (Signed)
[name] Male    DOB: [birth year]   71 y.o.   MRN: [id number] Visit Date: 05/25/2017  Today's Provider: Lelon Huh, MD   Chief Complaint  Patient presents with  . Urinary Frequency   Subjective:    Patient has had urinary frequency and urgency fir 4 days. Patient has also had low back pain, flank pain. No blood, no fever. Patient states he thinks he may have some swelling.   Patient also has been having a tingling sensation in his face and pressure around ear area. This does not occur often however he did have an episode yesterday that lasted for 4 hours. Patient believes this related to sinuses. Patient has been using flonase.    Urinary Frequency   This is a recurrent problem. The current episode started in the past 7 days (4 days ago). The problem has been gradually improving. The quality of the pain is described as aching. There has been no fever. Associated symptoms include flank pain, frequency and urgency. Pertinent negatives include no chills, discharge, hematuria, hesitancy, nausea, possible pregnancy, sweats or vomiting.       No Known Allergies   Current Outpatient Prescriptions:  .  ALPRAZolam (XANAX) 0.5 MG tablet, TAKE 1/2 TO 1 TABLET BY MOUTH 3 TIMES DAILY AS NEEDED, Disp: 60 tablet, Rfl: 5 .  aspirin 81 MG tablet, Take 1 tablet by mouth daily., Disp: , Rfl:  .  fluticasone (FLONASE) 50 MCG/ACT nasal spray, Place 2 sprays into both nostrils daily., Disp: 16 g, Rfl: 11 .  levothyroxine (SYNTHROID, LEVOTHROID) 125 MCG tablet, Take 1 tablet (125 mcg total) by mouth daily., Disp: 30 tablet, Rfl: 12 .  lovastatin (MEVACOR) 20 MG tablet, TAKE ONE TABLET BY MOUTH EVERY DAY, Disp: 90 tablet, Rfl: 4 .  MULTIPLE VITAMIN PO, Take 1 tablet by mouth daily., Disp: , Rfl:  .  naproxen (NAPROSYN) 500 MG tablet, TAKE ONE TABLET TWICE DAILY WITH A MEAL, Disp: 30 tablet, Rfl: 5 .  omeprazole (PRILOSEC) 20 MG capsule, TAKE 1 TO 2 CAPSULES EVERY DAY AS NEEDEDFOR  HEARTBURN, Disp: 60 capsule, Rfl: 12 .  Probiotic Product (PROBIOTIC PO), Take by mouth., Disp: , Rfl:  .  PROCTO-MED HC 2.5 % rectal cream, APPLY 2 OR 3 TIMES A DAY AS NEEDED, Disp: 30 g, Rfl: 5 .  tamsulosin (FLOMAX) 0.4 MG CAPS capsule, TAKE 1 CAPSULE BY MOUTH EVERY DAY, Disp: 90 capsule, Rfl: 4  Review of Systems  Constitutional: Negative for appetite change, chills and fever.  Respiratory: Negative for chest tightness, shortness of breath and wheezing.   Cardiovascular: Negative for chest pain and palpitations.  Gastrointestinal: Negative for abdominal pain, nausea and vomiting.  Genitourinary: Positive for flank pain, frequency and urgency. Negative for hematuria and hesitancy.    Social History  Substance Use Topics  . Smoking status: Never Smoker  . Smokeless tobacco: Never Used  . Alcohol use No   Objective:   BP 140/80 (BP Location: Right Arm, Patient Position: Sitting, Cuff Size: Normal)   Pulse 81   Temp 98.4 F (36.9 C) (Oral)   Resp (!) 3   Wt 203 lb (92.1 kg)   SpO2 (!) 62%   BMI 33.78 kg/m     Physical Exam  General Appearance:    Alert, cooperative, no distress  HENT:   ENT exam normal, no neck nodes or sinus tenderness  Eyes:    PERRL, conjunctiva/corneas clear, EOM's intact  Lungs:     Clear to auscultation bilaterally, respirations unlabored  Heart:    Regular rate and rhythm  Neurologic:   Awake, alert, oriented x 3. No apparent focal neurological           defect.       Results for orders placed or performed in visit on 05/25/17  POCT urinalysis dipstick  Result Value Ref Range   Color, UA Yellow    Clarity, UA Slightly Cloudy    Glucose, UA Neg    Bilirubin, UA Neg    Ketones, UA Trace    Spec Grav, UA 1.015 1.010 - 1.025   Blood, UA Trace    pH, UA 6.0 5.0 - 8.0   Protein, UA Neg    Urobilinogen, UA 0.2 0.2 or 1.0 E.U./dL   Nitrite, UA Neg    Leukocytes, UA Negative Negative       Assessment & Plan:     1. Acute  prostatitis  - ciprofloxacin (CIPRO) 500 MG tablet; Take 1 tablet (500 mg total) by mouth 2 (two) times daily.  Dispense: 42 tablet; Refill: 0  Call if symptoms change or if not rapidly improving.     2. Urinary frequency  - POCT urinalysis dipstick       Lelon Huh, MD  [facility]

## 2017-06-09 DIAGNOSIS — Z85828 Personal history of other malignant neoplasm of skin: Secondary | ICD-10-CM | POA: Diagnosis not present

## 2017-06-09 DIAGNOSIS — L820 Inflamed seborrheic keratosis: Secondary | ICD-10-CM | POA: Diagnosis not present

## 2017-06-09 DIAGNOSIS — Z85820 Personal history of malignant melanoma of skin: Secondary | ICD-10-CM | POA: Diagnosis not present

## 2017-06-09 DIAGNOSIS — X32XXXA Exposure to sunlight, initial encounter: Secondary | ICD-10-CM | POA: Diagnosis not present

## 2017-06-09 DIAGNOSIS — L570 Actinic keratosis: Secondary | ICD-10-CM | POA: Diagnosis not present

## 2017-06-09 DIAGNOSIS — D2261 Melanocytic nevi of right upper limb, including shoulder: Secondary | ICD-10-CM | POA: Diagnosis not present

## 2017-06-09 DIAGNOSIS — D225 Melanocytic nevi of trunk: Secondary | ICD-10-CM | POA: Diagnosis not present

## 2017-06-09 DIAGNOSIS — D485 Neoplasm of uncertain behavior of skin: Secondary | ICD-10-CM | POA: Diagnosis not present

## 2017-06-15 ENCOUNTER — Other Ambulatory Visit: Payer: Self-pay | Admitting: Family Medicine

## 2017-06-15 DIAGNOSIS — N410 Acute prostatitis: Secondary | ICD-10-CM

## 2017-07-24 ENCOUNTER — Encounter: Payer: Self-pay | Admitting: Family Medicine

## 2017-07-24 ENCOUNTER — Ambulatory Visit (INDEPENDENT_AMBULATORY_CARE_PROVIDER_SITE_OTHER): Payer: Medicare Other | Admitting: Family Medicine

## 2017-07-24 VITALS — BP 144/82 | HR 70 | Temp 98.7°F | Resp 18 | Wt 205.0 lb

## 2017-07-24 DIAGNOSIS — E785 Hyperlipidemia, unspecified: Secondary | ICD-10-CM

## 2017-07-24 DIAGNOSIS — R05 Cough: Secondary | ICD-10-CM | POA: Diagnosis not present

## 2017-07-24 DIAGNOSIS — Z1211 Encounter for screening for malignant neoplasm of colon: Secondary | ICD-10-CM

## 2017-07-24 DIAGNOSIS — E039 Hypothyroidism, unspecified: Secondary | ICD-10-CM | POA: Diagnosis not present

## 2017-07-24 DIAGNOSIS — I10 Essential (primary) hypertension: Secondary | ICD-10-CM | POA: Diagnosis not present

## 2017-07-24 DIAGNOSIS — J40 Bronchitis, not specified as acute or chronic: Secondary | ICD-10-CM

## 2017-07-24 DIAGNOSIS — R059 Cough, unspecified: Secondary | ICD-10-CM

## 2017-07-24 MED ORDER — AZITHROMYCIN 250 MG PO TABS: ORAL_TABLET | ORAL | 0 refills | Status: AC

## 2017-07-24 NOTE — Progress Notes (Signed)
[name] Male    DOB: 10-17-1946   71 y.o.   MRN: [id number] Visit Date: 07/24/2017  Today's Provider: Lelon Huh, MD   Chief Complaint  Patient presents with  . Cough    x 10 days   Subjective:    Cough  This is a new problem. Episode onset: 10 days ago. The problem has been gradually worsening. The cough is productive of sputum (clear colored). Associated symptoms include ear congestion, postnasal drip, shortness of breath and wheezing. Pertinent negatives include no chest pain, chills, ear pain, fever, hemoptysis, myalgias, nasal congestion, rhinorrhea, sore throat or sweats. Treatments tried: Hydromet cough syrup and Mucinex. The treatment provided mild relief.  Patient states the congestion has worsened in the past 2 days. He also has soreness in his chest from the coughing spells.   He also requests order for his routine labs. Is doing well with current BP, thyroid, and cholesterol medications  Lipid Panel     Component Value Date/Time   CHOL 143 07/22/2016 0928   TRIG 150 (H) 07/22/2016 0928   HDL 35 (L) 07/22/2016 0928   CHOLHDL 4.1 07/22/2016 0928   LDLCALC 78 07/22/2016 0928   Lab Results  Component Value Date   TSH 3.790 12/10/2016       No Known Allergies   Current Outpatient Prescriptions:  .  ALPRAZolam (XANAX) 0.5 MG tablet, TAKE 1/2 TO 1 TABLET BY MOUTH 3 TIMES DAILY AS NEEDED, Disp: 60 tablet, Rfl: 5 .  aspirin 81 MG tablet, Take 1 tablet by mouth daily., Disp: , Rfl:  .  fluticasone (FLONASE) 50 MCG/ACT nasal spray, Place 2 sprays into both nostrils daily., Disp: 16 g, Rfl: 11 .  levothyroxine (SYNTHROID, LEVOTHROID) 125 MCG tablet, Take 1 tablet (125 mcg total) by mouth daily., Disp: 30 tablet, Rfl: 12 .  lovastatin (MEVACOR) 20 MG tablet, TAKE ONE TABLET BY MOUTH EVERY DAY, Disp: 90 tablet, Rfl: 4 .  MULTIPLE VITAMIN PO, Take 1 tablet by mouth daily., Disp: , Rfl:  .  naproxen (NAPROSYN) 500 MG tablet, TAKE ONE TABLET TWICE  DAILY WITH A MEAL, Disp: 30 tablet, Rfl: 5 .  omeprazole (PRILOSEC) 20 MG capsule, TAKE 1 TO 2 CAPSULES EVERY DAY AS NEEDEDFOR HEARTBURN, Disp: 60 capsule, Rfl: 12 .  Probiotic Product (PROBIOTIC PO), Take by mouth., Disp: , Rfl:  .  PROCTO-MED HC 2.5 % rectal cream, APPLY 2 OR 3 TIMES A DAY AS NEEDED, Disp: 30 g, Rfl: 5 .  tamsulosin (FLOMAX) 0.4 MG CAPS capsule, TAKE 1 CAPSULE BY MOUTH EVERY DAY, Disp: 90 capsule, Rfl: 4  Review of Systems  Constitutional: Positive for fatigue. Negative for appetite change, chills, diaphoresis and fever.  HENT: Positive for congestion, postnasal drip, sinus pain and sinus pressure. Negative for ear pain, rhinorrhea and sore throat.   Respiratory: Positive for cough, shortness of breath and wheezing. Negative for hemoptysis and chest tightness.   Cardiovascular: Negative for chest pain and palpitations.  Gastrointestinal: Negative for abdominal pain, nausea and vomiting.  Musculoskeletal: Negative for myalgias.    Social History  Substance Use Topics  . Smoking status: Never Smoker  . Smokeless tobacco: Never Used  . Alcohol use No   Objective:   BP (!) 144/82 (BP Location: Left Arm, Patient Position: Sitting, Cuff Size: Large)   Pulse 70   Temp 98.7 F (37.1 C) (Oral)   Resp 18   Wt 205 lb (93 kg)   SpO2 97% Comment:  room air  BMI 34.11 kg/m  There were no vitals filed for this visit.   Physical Exam  General Appearance:    Alert, cooperative, no distress  HENT:   ENT exam normal, no neck nodes or sinus tenderness  Eyes:    PERRL, conjunctiva/corneas clear, EOM's intact       Lungs:     Occasional expiratory wheeze, no rales,  respirations unlabored  Heart:    Regular rate and rhythm  Neurologic:   Awake, alert, oriented x 3. No apparent focal neurological           defect.           Assessment & Plan:     1. Cough   2. Bronchitis  - azithromycin (ZITHROMAX) 250 MG tablet; 2 by mouth today, then 1 daily for 4 days  Dispense: 6  tablet; Refill: 0  3. Adult hypothyroidism  - TSH  4. Hyperlipidemia, unspecified hyperlipidemia type He is tolerating lovastatin well with no adverse effects.   - COMPLETE METABOLIC PANEL WITH GFR - Lipid panel  5. Essential (primary) hypertension Stable Continue current medications.   - COMPLETE METABOLIC PANEL WITH GFR  6. Colon cancer screening  - Cologuard       Lelon Huh, MD  [facility]

## 2017-07-24 NOTE — Patient Instructions (Signed)
.   I recommend that you get a flu vaccine this year. Please call our office at [phone] at your earliest convenience to schedule a flu shot.    

## 2017-07-28 ENCOUNTER — Other Ambulatory Visit: Payer: Self-pay | Admitting: Family Medicine

## 2017-07-28 DIAGNOSIS — N410 Acute prostatitis: Secondary | ICD-10-CM

## 2017-07-30 DIAGNOSIS — E785 Hyperlipidemia, unspecified: Secondary | ICD-10-CM | POA: Diagnosis not present

## 2017-07-30 DIAGNOSIS — I10 Essential (primary) hypertension: Secondary | ICD-10-CM | POA: Diagnosis not present

## 2017-07-30 DIAGNOSIS — E039 Hypothyroidism, unspecified: Secondary | ICD-10-CM | POA: Diagnosis not present

## 2017-07-30 LAB — LIPID PANEL
Cholesterol: 138 mg/dL (ref <200)
HDL: 38 mg/dL — ABNORMAL LOW (ref >40)
LDL Cholesterol (Calc): 79 mg/dL (calc)
NON-HDL CHOLESTEROL (CALC): 100 mg/dL (ref <130)
Total CHOL/HDL Ratio: 3.6 (calc) (ref <5.0)
Triglycerides: 113 mg/dL (ref <150)

## 2017-07-30 LAB — COMPLETE METABOLIC PANEL WITH GFR
AG RATIO: 1.3 (calc) (ref 1.0–2.5)
ALT: 11 U/L (ref 9–46)
AST: 18 U/L (ref 10–35)
Albumin: 3.8 g/dL (ref 3.6–5.1)
Alkaline phosphatase (APISO): 51 U/L (ref 40–115)
BILIRUBIN TOTAL: 0.6 mg/dL (ref 0.2–1.2)
BUN: 9 mg/dL (ref 7–25)
CALCIUM: 8.3 mg/dL — AB (ref 8.6–10.3)
CO2: 25 mmol/L (ref 20–32)
Chloride: 105 mmol/L (ref 98–110)
Creat: 0.96 mg/dL (ref 0.70–1.18)
GFR, EST AFRICAN AMERICAN: 92 mL/min/{1.73_m2} (ref >=60)
GFR, EST NON AFRICAN AMERICAN: 79 mL/min/{1.73_m2} (ref >=60)
GLOBULIN: 3 g/dL (ref 1.9–3.7)
Glucose, Bld: 93 mg/dL (ref 65–99)
POTASSIUM: 3.9 mmol/L (ref 3.5–5.3)
SODIUM: 139 mmol/L (ref 135–146)
TOTAL PROTEIN: 6.8 g/dL (ref 6.1–8.1)

## 2017-07-30 LAB — TSH: TSH: 10.3 m[IU]/L — AB (ref 0.40–4.50)

## 2017-07-31 ENCOUNTER — Telehealth: Payer: Self-pay

## 2017-07-31 MED ORDER — LEVOTHYROXINE SODIUM 150 MCG PO TABS: 150.0000 ug | ORAL_TABLET | Freq: Every day | ORAL | 5 refills | Status: DC

## 2017-07-31 MED ORDER — AMOXICILLIN 500 MG PO CAPS: 1000.0000 mg | ORAL_CAPSULE | Freq: Two times a day (BID) | ORAL | 0 refills | Status: AC

## 2017-07-31 NOTE — Telephone Encounter (Signed)
Patient was advised and script electronically sent into [facility]. Patient reports that he was seen last Friday with complaints of congestion and prescribed a Z-pack. Patient reports he finished antibiotic but symptoms have not improved, patient request a prescription be sent to [facility] pharmacy. KW

## 2017-07-31 NOTE — Telephone Encounter (Signed)
-----   Message from Birdie Sons, MD sent at 07/31/2017  8:33 AM EDT ----- Cholesterol well controlled at 138. Is getting hyPOthyroid, need to increase levothyroxine to 173mcg daily, #30, rf x 5. Continue current medications.  Need to recheck thyroid functions 3 months. Is due for CPE recommend he schedule this and we can check labs then.

## 2017-07-31 NOTE — Telephone Encounter (Signed)
-----   Message from Birdie Sons, MD sent at 07/30/2017  9:01 PM EDT ----- Is hypothyroid. Need to increase levothyroxine to 137mcg daily, #30, rf x 5.

## 2017-08-10 DIAGNOSIS — Z23 Encounter for immunization: Secondary | ICD-10-CM | POA: Diagnosis not present

## 2017-08-24 ENCOUNTER — Telehealth: Payer: Self-pay | Admitting: Family Medicine

## 2017-08-24 NOTE — Telephone Encounter (Signed)
Order for cologuard faxed to [facility] °

## 2017-09-03 DIAGNOSIS — Z1211 Encounter for screening for malignant neoplasm of colon: Secondary | ICD-10-CM | POA: Diagnosis not present

## 2017-09-03 DIAGNOSIS — Z1212 Encounter for screening for malignant neoplasm of rectum: Secondary | ICD-10-CM | POA: Diagnosis not present

## 2017-09-03 LAB — COLOGUARD: COLOGUARD: NEGATIVE

## 2017-09-29 ENCOUNTER — Telehealth: Payer: Self-pay

## 2017-09-29 NOTE — Telephone Encounter (Signed)
Patient advised.

## 2017-09-29 NOTE — Telephone Encounter (Signed)
-----   Message from Birdie Sons, MD sent at 09/29/2017 10:01 AM EST ----- Cologuard test is negative. Repeat in 3 years.

## 2017-10-20 ENCOUNTER — Telehealth: Payer: Self-pay | Admitting: Family Medicine

## 2017-10-20 DIAGNOSIS — E039 Hypothyroidism, unspecified: Secondary | ICD-10-CM

## 2017-10-20 NOTE — Telephone Encounter (Signed)
Pt stated he is scheduled for CPE on 11/05/16 and would like to go ahead & have labs done prior to the appt. Please advise. Thanks TNP

## 2017-10-20 NOTE — Telephone Encounter (Signed)
Please advise 

## 2017-10-21 NOTE — Telephone Encounter (Signed)
LMTCB Lab slip placed up front-Anastasiya V Hopkins, RMA

## 2017-10-21 NOTE — Telephone Encounter (Signed)
Pt advised-Christian Mora, RMA  

## 2017-10-21 NOTE — Telephone Encounter (Signed)
Just needs tsh for hypothyroid

## 2017-10-22 DIAGNOSIS — E039 Hypothyroidism, unspecified: Secondary | ICD-10-CM | POA: Diagnosis not present

## 2017-10-22 LAB — TSH: TSH: 0.68 m[IU]/L (ref 0.40–4.50)

## 2017-10-29 ENCOUNTER — Other Ambulatory Visit: Payer: Self-pay | Admitting: Family Medicine

## 2017-11-05 ENCOUNTER — Encounter: Payer: Self-pay | Admitting: Family Medicine

## 2017-11-05 ENCOUNTER — Ambulatory Visit (INDEPENDENT_AMBULATORY_CARE_PROVIDER_SITE_OTHER): Payer: Medicare Other | Admitting: Family Medicine

## 2017-11-05 VITALS — BP 140/80 | HR 86 | Temp 98.2°F | Resp 16 | Ht 65.0 in | Wt 203.0 lb

## 2017-11-05 DIAGNOSIS — H9193 Unspecified hearing loss, bilateral: Secondary | ICD-10-CM | POA: Diagnosis not present

## 2017-11-05 DIAGNOSIS — E039 Hypothyroidism, unspecified: Secondary | ICD-10-CM

## 2017-11-05 DIAGNOSIS — R350 Frequency of micturition: Secondary | ICD-10-CM | POA: Diagnosis not present

## 2017-11-05 DIAGNOSIS — I10 Essential (primary) hypertension: Secondary | ICD-10-CM | POA: Diagnosis not present

## 2017-11-05 DIAGNOSIS — Z0001 Encounter for general adult medical examination with abnormal findings: Secondary | ICD-10-CM

## 2017-11-05 DIAGNOSIS — F419 Anxiety disorder, unspecified: Secondary | ICD-10-CM | POA: Diagnosis not present

## 2017-11-05 DIAGNOSIS — N410 Acute prostatitis: Secondary | ICD-10-CM | POA: Diagnosis not present

## 2017-11-05 DIAGNOSIS — Z6833 Body mass index (BMI) 33.0-33.9, adult: Secondary | ICD-10-CM | POA: Diagnosis not present

## 2017-11-05 DIAGNOSIS — K649 Unspecified hemorrhoids: Secondary | ICD-10-CM

## 2017-11-05 DIAGNOSIS — Z0000 Encounter for general adult medical examination without abnormal findings: Secondary | ICD-10-CM

## 2017-11-05 MED ORDER — ALPRAZOLAM 0.5 MG PO TABS: ORAL_TABLET | ORAL | 5 refills | Status: DC

## 2017-11-05 MED ORDER — LEVOTHYROXINE SODIUM 150 MCG PO TABS: 150.0000 ug | ORAL_TABLET | Freq: Every day | ORAL | 1 refills | Status: DC

## 2017-11-05 MED ORDER — HYDROCORTISONE 2.5 % RE CREA: TOPICAL_CREAM | RECTAL | 5 refills | Status: DC

## 2017-11-05 MED ORDER — TAMSULOSIN HCL 0.4 MG PO CAPS: 0.8000 mg | ORAL_CAPSULE | Freq: Every day | ORAL | 0 refills | Status: DC

## 2017-11-05 NOTE — Progress Notes (Signed)
[name], Male    DOB: 04/08/1946, 72 y.o.   MRN: [id number] Visit Date: 11/05/2017  Today's Provider: Lelon Huh, MD   Chief Complaint  Patient presents with  . Medicare Wellness  . Hypertension  . Hyperlipidemia  . Hypothyroidism   Subjective:    Annual wellness visit Christian Mora is a 72 y.o. male. He feels well. He reports exercising some/home. He reports he is sleeping fairly well.  -----------------------------------------------------------   Hypertension, follow-up:  BP Readings from Last 3 Encounters:  11/05/17 140/80  07/24/17 (!) 144/82  05/25/17 140/80    He was last seen for hypertension 4 months ago.  BP at that visit was 144/82. Management since that visit includes; no changes.He reports good compliance with treatment. He is not having side effects. none He is exercising. He is adherent to low salt diet.   Outside blood pressures are 120/70. He is experiencing none.  Patient denies none.   Cardiovascular risk factors include advanced age (older than 42 for men, 60 for women).  Use of agents associated with hypertension: none.   ----------------------------------------------------------------     Lipid/Cholesterol, Follow-up:   Last seen for this 4 months ago.  Management since that visit includes; labs checked, no changes.  Last Lipid Panel:    Component Value Date/Time   CHOL 138 07/30/2017 0758   CHOL 143 07/22/2016 0928   TRIG 113 07/30/2017 0758   HDL 38 (L) 07/30/2017 0758   HDL 35 (L) 07/22/2016 0928   CHOLHDL 3.6 07/30/2017 0758   LDLCALC 78 07/22/2016 0928    He reports good compliance with treatment. He is not having side effects. none  Wt Readings from Last 3 Encounters:  11/05/17 203 lb (92.1 kg)  07/24/17 205 lb (93 kg)  05/25/17 203 lb (92.1 kg)    ----------------------------------------------------------------   Adult hypothyroidism From 07/24/2017-labs checked. Increased Levothyroxine  to 150 mg qd, follow up TSH in December was normal.  TSH  Date Value Ref Range Status  10/22/2017 0.68 0.40 - 4.50 mIU/L Final  07/30/2017 10.30 (H) 0.40 - 4.50 mIU/L Final    States he continues to have urinary frequency and occasional urine leakage. Has been on Cipro for suspected prostatitis.Has no trouble with urinary hesitancy most of the time. Is also taking tamsulosin daily.     Review of Systems  Constitutional: Negative.   HENT: Positive for tinnitus.   Eyes: Negative.   Respiratory: Negative.   Cardiovascular: Negative.   Gastrointestinal: Negative.   Endocrine: Positive for cold intolerance.  Genitourinary: Positive for enuresis.  Musculoskeletal: Positive for arthralgias and back pain.  Skin: Negative.   Allergic/Immunologic: Negative.   Neurological: Negative.   Hematological: Negative.   Psychiatric/Behavioral: The patient is nervous/anxious.     Social History   Socioeconomic History  . Marital status: Married    Spouse name: Not on file  . Number of children: Not on file  . Years of education: Not on file  . Highest education level: Not on file  Social Needs  . Financial resource strain: Not on file  . Food insecurity - worry: Not on file  . Food insecurity - inability: Not on file  . Transportation needs - medical: Not on file  . Transportation needs - non-medical: Not on file  Occupational History  . Not on file  Tobacco Use  . Smoking status: Never Smoker  . Smokeless tobacco: Never Used  Substance and Sexual Activity  . Alcohol use:  No  . Drug use: No  . Sexual activity: Not on file  Other Topics Concern  . Not on file  Social History Narrative  . Not on file    Past Medical History:  Diagnosis Date  . Allergy   . Anemia   . Anxiety   . GERD (gastroesophageal reflux disease)   . Hyperlipidemia   . Hypertension   . Thyroid disease      Patient Active Problem List   Diagnosis Date Noted  . Urinary hesitancy 02/25/2017  . 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 Essential (primary) hypertension 02/18/2010  . Allergic rhinitis 02/22/2009  . BPH (benign prostatic hyperplasia) 10/06/2007  . HLD (hyperlipidemia) 02/23/2007  . Anxiety 12/15/2006  . Acid reflux 11/04/2003  . Melanoma of skin ([place]) 03/13/2003  . Adult hypothyroidism 07/03/1989    Past Surgical History:  Procedure Laterality Date  . lymph node removed  2004  . [place]    His family history includes Cancer in his father; Diabetes in his son; Hypertension in his mother.      Current Outpatient Medications:  .  ALPRAZolam (XANAX) 0.5 MG tablet, TAKE 1/2 TO 1 TABLET BY MOUTH 3 TIMES DAILY AS NEEDED, Disp: 60 tablet, Rfl: 5 .  aspirin 81 MG tablet, Take 1 tablet by mouth daily., Disp: , Rfl:  .  fluticasone (FLONASE) 50 MCG/ACT nasal spray, Place 2 sprays into both nostrils daily., Disp: 16 g, Rfl: 11 .  levothyroxine (SYNTHROID, LEVOTHROID) 150 MCG tablet, Take 1 tablet (150 mcg total) by mouth daily., Disp: 30 tablet, Rfl: 5 .  lovastatin (MEVACOR) 20 MG tablet, TAKE ONE TABLET BY MOUTH EVERY DAY, Disp: 90 tablet, Rfl: 4 .  MULTIPLE VITAMIN PO, Take 1 tablet by mouth daily., Disp: , Rfl:  .  naproxen (NAPROSYN) 500 MG tablet, TAKE ONE TABLET TWICE DAILY WITH A MEAL, Disp: 30 tablet, Rfl: 5 .  omeprazole (PRILOSEC) 20 MG capsule, TAKE 1 TO 2 CAPSULES EVERY DAY AS NEEDEDFOR HEARTBURN, Disp: 60 capsule, Rfl: 12 .  Probiotic Product (PROBIOTIC PO), Take by mouth., Disp: , Rfl:  .  PROCTO-MED HC 2.5 % rectal cream, APPLY 2 OR 3 TIMES A DAY AS NEEDED, Disp: 30 g, Rfl: 5 .  tamsulosin (FLOMAX) 0.4 MG CAPS capsule, TAKE 1 CAPSULE EVERY DAY, Disp: 90 capsule, Rfl: 4  Patient Care Team: Birdie Sons, MD as PCP - General (Family Medicine)     Objective:   Vitals: BP 140/80 (BP Location: Left Arm, Patient Position: Sitting, Cuff Size: Large)   Pulse 86   Temp 98.2 F (36.8 C) (Oral)   Resp  16   Ht 5\' 5"  (1.651 m)   Wt 203 lb (92.1 kg)   SpO2 97%   BMI 33.78 kg/m   Physical Exam   General Appearance:    Alert, cooperative, no distress  Eyes:    PERRL, conjunctiva/corneas clear, EOM's intact       Lungs:     Clear to auscultation bilaterally, respirations unlabored  Heart:    Regular rate and rhythm  Neurologic:   Awake, alert, oriented x 3. No apparent focal neurological           defect.        Activities of Daily Living In your present state of health, do you have any difficulty performing the following activities: 11/05/2017  Hearing? Y  Vision? N  Difficulty concentrating or making decisions? N  Walking or climbing stairs? N  Dressing or bathing? N  Doing errands, shopping? N  Some recent data might be hidden    Fall Risk Assessment Fall Risk  11/05/2017 07/30/2015  Falls in the past year? No No     Depression Screen PHQ 2/9 Scores 11/05/2017 07/30/2015  PHQ - 2 Score 0 0    Cognitive Testing - 6-CIT  Correct? Score   What year is it? yes 0 0 or 4  What month is it? yes 0 0 or 3  Memorize:    Pia Mau,  42,  [place],      What time is it? (within 1 hour) yes 0 0 or 3  Count backwards from 20 yes 0 0, 2, or 4  Name the months of the year yes 0 0, 2, or 4  Repeat name & address above yes 3 0, 2, 4, 6, 8, or 10       TOTAL SCORE  3/28   Interpretation:  Normal  Normal (0-7) Abnormal (8-28)    Audit-C Alcohol Use Screening  Question Answer Points  How often do you have alcoholic drink? never 0  On days you do drink alcohol, how many drinks do you typically consume? 0 0  How oftey will you drink 6 or more in a total? never 0  Total Score:  0   A score of 3 or more in women, and 4 or more in men indicates increased risk for alcohol abuse, EXCEPT if all of the points are from question 1.     Assessment & Plan:     Annual Wellness Visit  Reviewed patient's Family Medical History Reviewed and updated list of patient's medical  providers Assessment of cognitive impairment was done Assessed patient's functional ability Established a written schedule for health screening [place] Completed and Reviewed  Exercise Activities and Dietary recommendations Goals    . Exercise 150 minutes per week (moderate activity)       Immunization History  Administered Date(s) Administered  . Influenza, High Dose Seasonal PF 07/30/2015, 08/12/2016  . Influenza-Unspecified 08/10/2017  . Pneumococcal Conjugate-13 09/14/2014  . Pneumococcal Polysaccharide-23 08/13/2012  . Zoster 08/29/2013    Health Maintenance  Topic Date Due  . Samul Dada  01/23/1965  . Fecal DNA (Cologuard)  09/03/2020  . INFLUENZA VACCINE  Completed  . Hepatitis C Screening  Completed  . PNA vac Low Risk Adult  Completed     Discussed health benefits of physical activity, and encouraged him to engage in regular exercise appropriate for his age and condition.      --------------------------------------------------------------------------  1. Medicare annual wellness visit, subsequent Up to date on HM  2. Essential (primary) hypertension Well controlled.    3. Adult hypothyroidism Euthyroid on current dose of  levothyroxine (SYNTHROID, LEVOTHROID) 150 MCG tablet; Take 1 tablet (150 mcg total) by mouth daily.  Dispense: 90 tablet; Refill: 1  4. Anxiety Doing well on current dose of  ALPRAZolam (XANAX) 0.5 MG tablet; TAKE 1/2 TO 1 TABLET BY MOUTH 3 TIMES DAILY AS NEEDED  Dispense: 60 tablet; Refill: 5  5. BMI 33.0-33.9,adult Counseled regarding prudent diet and regular exercise. \  6. Acute prostatitis Has been on prolonged course of ciprofloxacin with some improvement, but not resolution of sx. Residual sx likely due to bph. Discussed urology referral, he would like to try increase dose of tamsulosin (FLOMAX) 0.4 MG CAPS capsule; Take 2 capsules (0.8 mg total) by mouth daily.  Dispense: 180 capsule; Refill:  0  Counseled on potential adverse effects of higher doses  7. Urinary frequency Normal u/a  8. Hemorrhoids, unspecified hemorrhoid type Needs refill  hydrocortisone (PROCTO-MED HC) 2.5 % rectal cream; APPLY 2 OR 3 TIMES A DAY AS NEEDED  Dispense: 30 g; Refill: 5  9. Bilateral hearing loss, unspecified hearing loss type  - Ambulatory referral to Audiology   Lelon Huh, MD  [facility]

## 2017-11-05 NOTE — Patient Instructions (Addendum)
The CDC recommends two doses of Shingrix (the shingles vaccine) separated by 2 to 6 months for adults age 72 years and older. I recommend checking with your pharmacy plan regarding coverage for this vaccine.    You are also due to Tdap vaccine (tetanus-diptheria-pertussis).  I recommend checking with your pharmacy plan regarding coverage for this vaccine.     Preventive Care 72 Years and Older, Male Preventive care refers to lifestyle choices and visits with your health care provider that can promote health and wellness. What does preventive care include?  A yearly physical exam. This is also called an annual well check.  Dental exams once or twice a year.  Routine eye exams. Ask your health care provider how often you should have your eyes checked.  Personal lifestyle choices, including: ? Daily care of your teeth and gums. ? Regular physical activity. ? Eating a healthy diet. ? Avoiding tobacco and drug use. ? Limiting alcohol use. ? Practicing safe sex. ? Taking low doses of aspirin every day. ? Taking vitamin and mineral supplements as recommended by your health care provider. What happens during an annual well check? The services and screenings done by your health care provider during your annual well check will depend on your age, overall health, lifestyle risk factors, and family history of disease. Counseling Your health care provider may ask you questions about your:  Alcohol use.  Tobacco use.  Drug use.  Emotional well-being.  Home and relationship well-being.  Sexual activity.  Eating habits.  History of falls.  Memory and ability to understand (cognition).  Work and work Statistician.  Screening You may have the following tests or measurements:  Height, weight, and BMI.  Blood pressure.  Lipid and cholesterol levels. These may be checked every 5 years, or more frequently if you are over 29 years old.  Skin check.  Lung cancer screening. You  may have this screening every year starting at age 72 if you have a 30-pack-year history of smoking and currently smoke or have quit within the past 15 years.  Fecal occult blood test (FOBT) of the stool. You may have this test every year starting at age 72.  Flexible sigmoidoscopy or colonoscopy. You may have a sigmoidoscopy every 5 years or a colonoscopy every 10 years starting at age 72.  Prostate cancer screening. Recommendations will vary depending on your family history and other risks.  Hepatitis C blood test.  Hepatitis B blood test.  Sexually transmitted disease (STD) testing.  Diabetes screening. This is done by checking your blood sugar (glucose) after you have not eaten for a while (fasting). You may have this done every 1-3 years.  Abdominal aortic aneurysm (AAA) screening. You may need this if you are a current or former smoker.  Osteoporosis. You may be screened starting at age 72 if you are at high risk.  Talk with your health care provider about your test results, treatment options, and if necessary, the need for more tests. Vaccines Your health care provider may recommend certain vaccines, such as:  Influenza vaccine. This is recommended every year.  Tetanus, diphtheria, and acellular pertussis (Tdap, Td) vaccine. You may need a Td booster every 10 years.  Varicella vaccine. You may need this if you have not been vaccinated.  Zoster vaccine. You may need this after age 37.  Measles, mumps, and rubella (MMR) vaccine. You may need at least one dose of MMR if you were born in 1957 or later. You may also  need a second dose.  Pneumococcal 13-valent conjugate (PCV13) vaccine. One dose is recommended after age 72.  Pneumococcal polysaccharide (PPSV23) vaccine. One dose is recommended after age 72.  Meningococcal vaccine. You may need this if you have certain conditions.  Hepatitis A vaccine. You may need this if you have certain conditions or if you travel or work  in places where you may be exposed to hepatitis A.  Hepatitis B vaccine. You may need this if you have certain conditions or if you travel or work in places where you may be exposed to hepatitis B.  Haemophilus influenzae type b (Hib) vaccine. You may need this if you have certain risk factors.  Talk to your health care provider about which screenings and vaccines you need and how often you need them. This information is not intended to replace advice given to you by your health care provider. Make sure you discuss any questions you have with your health care provider. Document Released: 11/16/2015 Document Revised: 07/09/2016 Document Reviewed: 08/21/2015 Elsevier Interactive Patient Education  [employer].

## 2017-11-10 IMAGING — CR DG CHEST 2V
1 series · 2 of 2 positions shown · non-contrast
Comparison: Chest x-ray of 04/13/2014

CLINICAL DATA: Recent [place] of the flu, now with right sided lower
chest pain and cough

EXAM:
CHEST  2 VIEW

[Series 1: dg chest 2 view · 0.14mm/px · 2 of 2 slices shown]
[im 1/2]
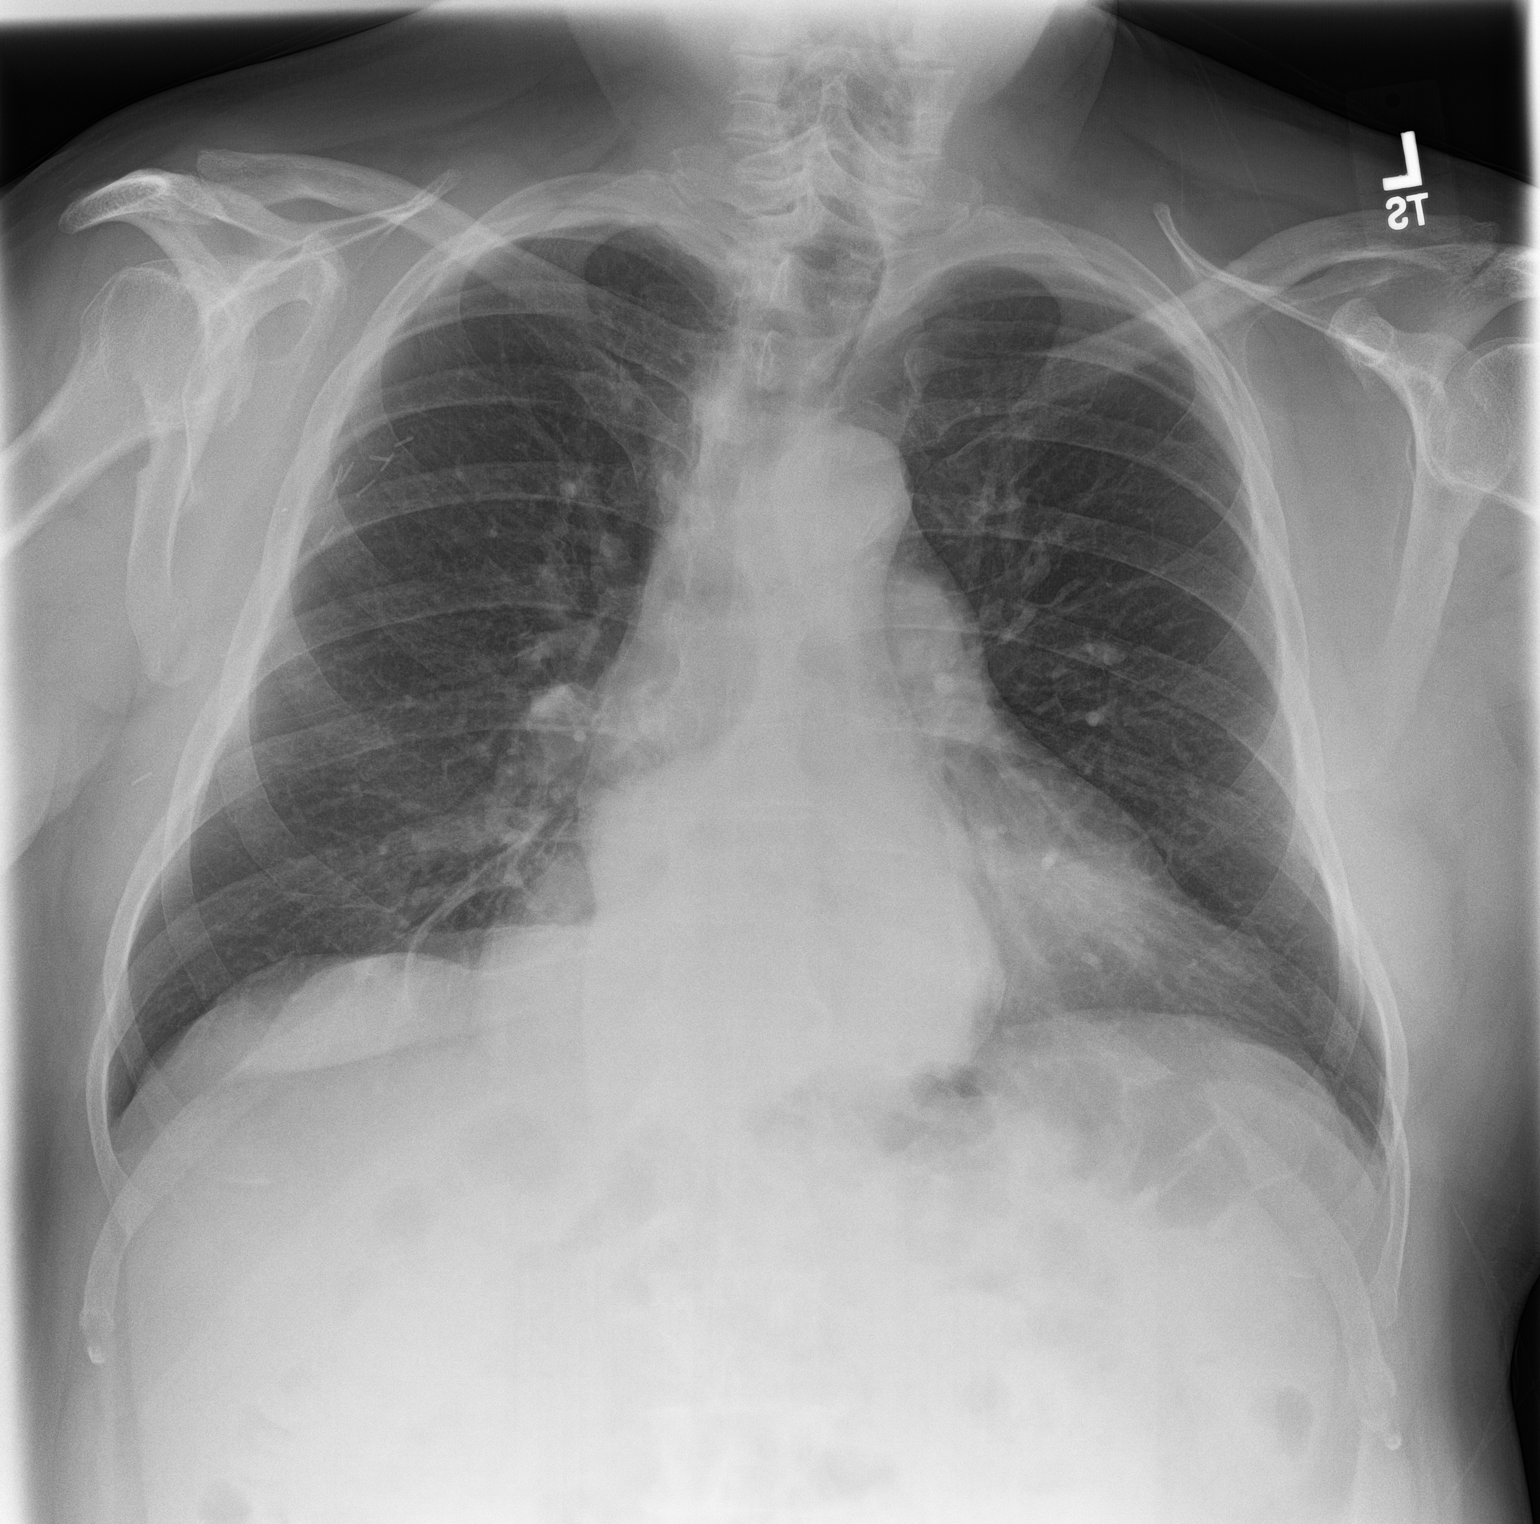
[im 2/2]
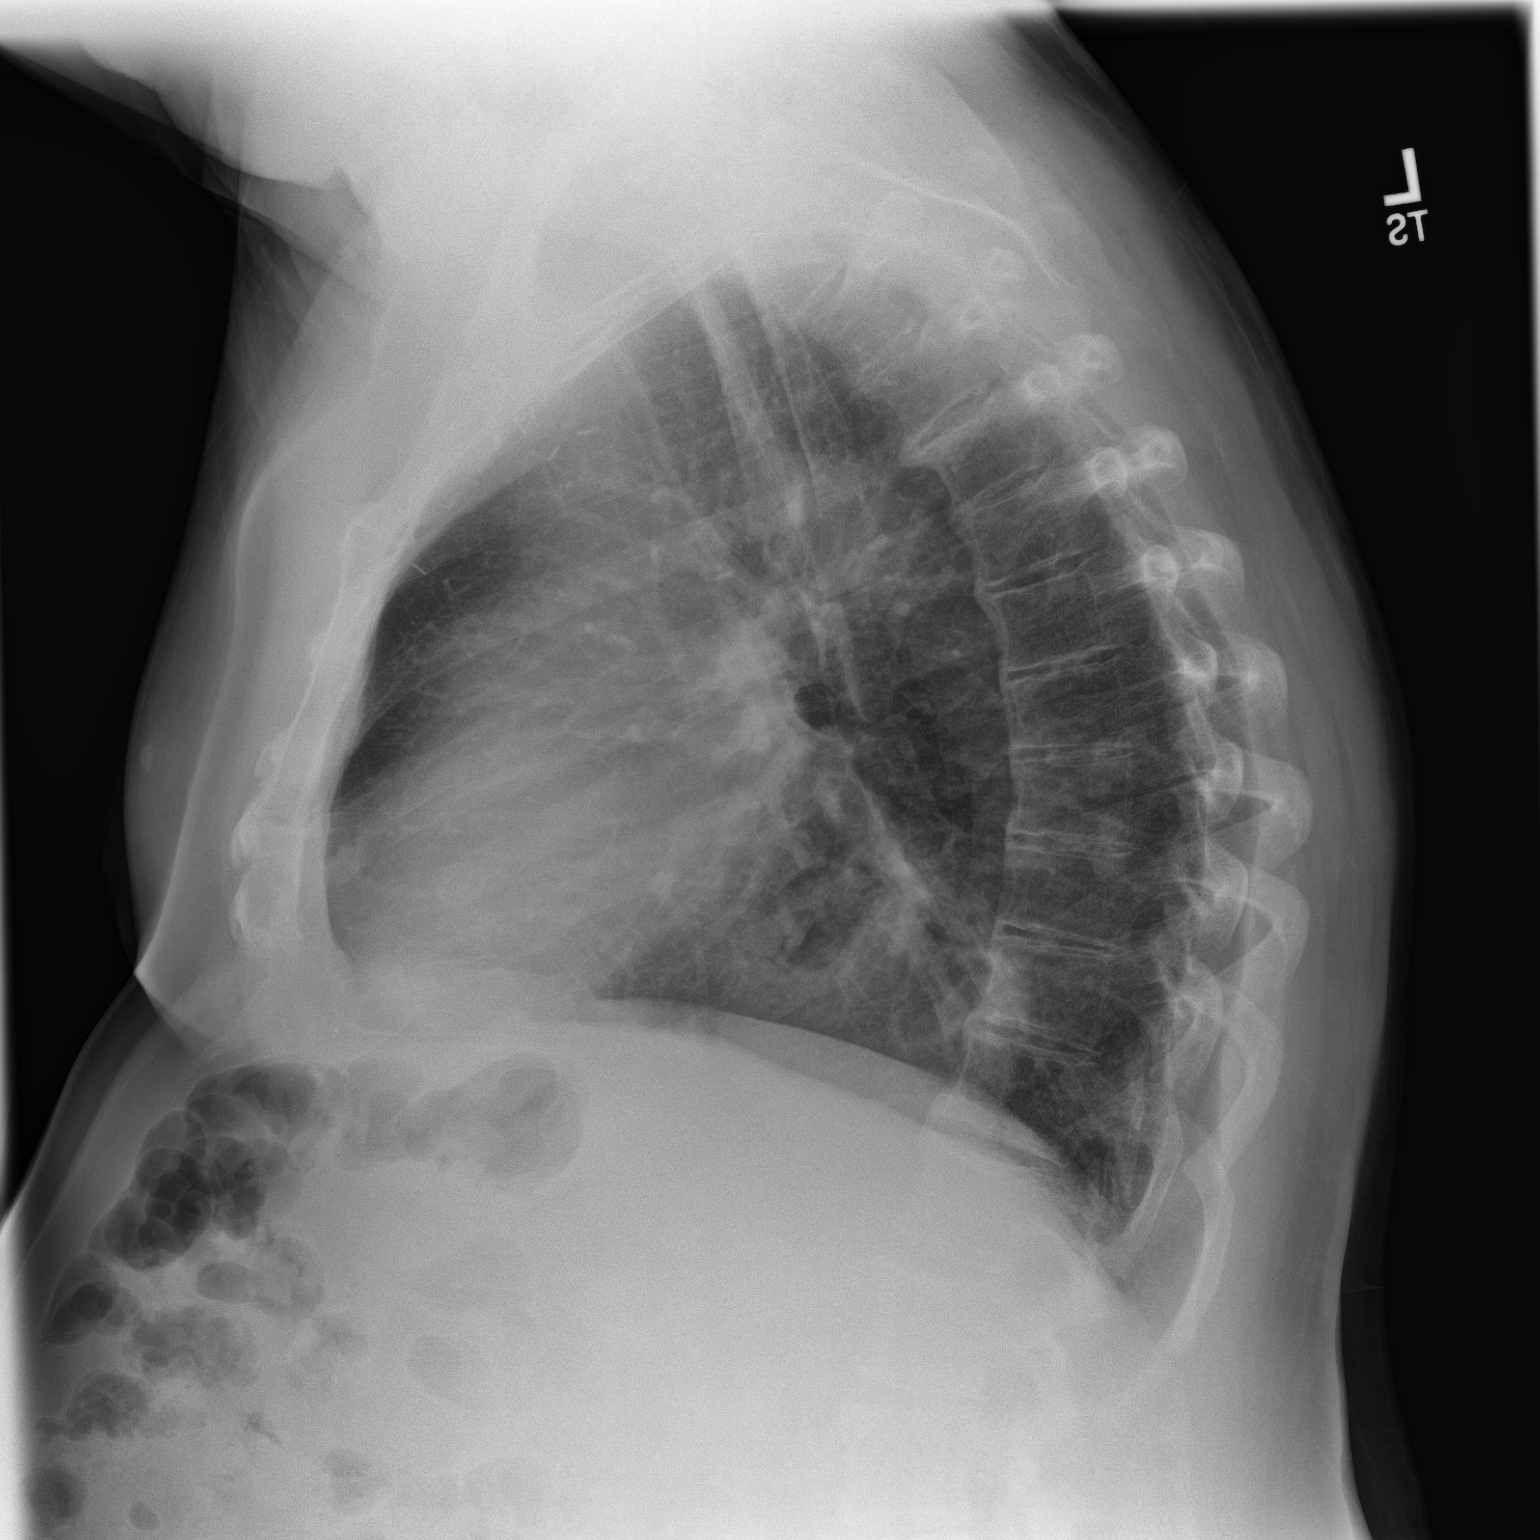

[2 of 2 positions shown; findings below may reference images not displayed]

FINDINGS: No active infiltrate or effusion is seen. Mediastinal and hilar
contours are unremarkable. The heart is within normal limits in size
for age. A moderate size hiatal hernia is noted. There are
degenerative changes throughout the thoracic spine.
IMPRESSION: 1. No active cardiopulmonary disease.
2. Stable moderate size hiatal hernia.

## 2017-11-13 DIAGNOSIS — H6123 Impacted cerumen, bilateral: Secondary | ICD-10-CM | POA: Diagnosis not present

## 2017-11-13 DIAGNOSIS — H903 Sensorineural hearing loss, bilateral: Secondary | ICD-10-CM | POA: Diagnosis not present

## 2018-01-18 ENCOUNTER — Telehealth: Payer: Self-pay | Admitting: Family Medicine

## 2018-01-18 DIAGNOSIS — I10 Essential (primary) hypertension: Secondary | ICD-10-CM

## 2018-01-18 DIAGNOSIS — E785 Hyperlipidemia, unspecified: Secondary | ICD-10-CM

## 2018-01-18 DIAGNOSIS — Z125 Encounter for screening for malignant neoplasm of prostate: Secondary | ICD-10-CM

## 2018-01-18 DIAGNOSIS — E039 Hypothyroidism, unspecified: Secondary | ICD-10-CM

## 2018-01-18 NOTE — Telephone Encounter (Signed)
Please advise 

## 2018-01-18 NOTE — Telephone Encounter (Signed)
Patient is almost out of thyroid medication and needs his levels checked prior to his ov on 02/05/18.   He wants this done this week as well as cholesterol, psa and a CBC. Please advise pt.

## 2018-01-21 NOTE — Telephone Encounter (Signed)
Please print pended order and leave for patient at lab. Please advise patient.

## 2018-01-21 NOTE — Telephone Encounter (Signed)
Lab orders signed and requisition printed, then placed up front for pick up. Patient advised that lab order is ready for pick up.

## 2018-01-21 NOTE — Telephone Encounter (Signed)
Patient came into office and states he has not heard anything regarding him wanting to get his lab work done prior to his appt on 02/05/18. Please see prior message. Patient states he would like a call back regarding this . He contact [phone] please advise. Thank you

## 2018-01-21 NOTE — Telephone Encounter (Signed)
Please advise 

## 2018-01-22 ENCOUNTER — Other Ambulatory Visit: Payer: Self-pay | Admitting: Family Medicine

## 2018-01-22 DIAGNOSIS — D509 Iron deficiency anemia, unspecified: Secondary | ICD-10-CM | POA: Diagnosis not present

## 2018-01-22 DIAGNOSIS — E785 Hyperlipidemia, unspecified: Secondary | ICD-10-CM | POA: Diagnosis not present

## 2018-01-22 DIAGNOSIS — E039 Hypothyroidism, unspecified: Secondary | ICD-10-CM | POA: Diagnosis not present

## 2018-01-22 DIAGNOSIS — Z125 Encounter for screening for malignant neoplasm of prostate: Secondary | ICD-10-CM | POA: Diagnosis not present

## 2018-01-22 DIAGNOSIS — D649 Anemia, unspecified: Secondary | ICD-10-CM | POA: Diagnosis not present

## 2018-01-22 DIAGNOSIS — I10 Essential (primary) hypertension: Secondary | ICD-10-CM | POA: Diagnosis not present

## 2018-01-22 DIAGNOSIS — D539 Nutritional anemia, unspecified: Secondary | ICD-10-CM | POA: Diagnosis not present

## 2018-01-23 LAB — COMPREHENSIVE METABOLIC PANEL
ALK PHOS: 55 IU/L (ref 39–117)
ALT: 18 IU/L (ref 0–44)
AST: 20 IU/L (ref 0–40)
Albumin/Globulin Ratio: 1.5 (ref 1.2–2.2)
Albumin: 4.2 g/dL (ref 3.5–4.8)
BUN/Creatinine Ratio: 12 (ref 10–24)
BUN: 13 mg/dL (ref 8–27)
Bilirubin Total: 0.5 mg/dL (ref 0.0–1.2)
CHLORIDE: 106 mmol/L (ref 96–106)
CO2: 23 mmol/L (ref 20–29)
Calcium: 8.7 mg/dL (ref 8.6–10.2)
Creatinine, Ser: 1.05 mg/dL (ref 0.76–1.27)
GFR calc Af Amer: 82 mL/min/{1.73_m2} (ref >59)
GFR calc non Af Amer: 71 mL/min/{1.73_m2} (ref >59)
GLUCOSE: 102 mg/dL — AB (ref 65–99)
Globulin, Total: 2.8 g/dL (ref 1.5–4.5)
Potassium: 4.3 mmol/L (ref 3.5–5.2)
Sodium: 142 mmol/L (ref 134–144)
TOTAL PROTEIN: 7 g/dL (ref 6.0–8.5)

## 2018-01-23 LAB — LIPID PANEL
CHOLESTEROL TOTAL: 135 mg/dL (ref 100–199)
Chol/HDL Ratio: 3.5 ratio (ref 0.0–5.0)
HDL: 39 mg/dL — AB (ref >39)
LDL Calculated: 73 mg/dL (ref 0–99)
TRIGLYCERIDES: 114 mg/dL (ref 0–149)
VLDL CHOLESTEROL CAL: 23 mg/dL (ref 5–40)

## 2018-01-23 LAB — CBC
HEMATOCRIT: 31.7 % — AB (ref 37.5–51.0)
Hemoglobin: 9.1 g/dL — ABNORMAL LOW (ref 13.0–17.7)
MCH: 21 pg — AB (ref 26.6–33.0)
MCHC: 28.7 g/dL — AB (ref 31.5–35.7)
MCV: 73 fL — ABNORMAL LOW (ref 79–97)
Platelets: 164 10*3/uL (ref 150–379)
RBC: 4.33 x10E6/uL (ref 4.14–5.80)
RDW: 17.6 % — ABNORMAL HIGH (ref 12.3–15.4)
WBC: 4.2 10*3/uL (ref 3.4–10.8)

## 2018-01-23 LAB — TSH: TSH: 0.301 u[IU]/mL — ABNORMAL LOW (ref 0.450–4.500)

## 2018-01-23 LAB — PSA: Prostate Specific Ag, Serum: 0.8 ng/mL (ref 0.0–4.0)

## 2018-01-26 ENCOUNTER — Encounter: Payer: Self-pay | Admitting: Family Medicine

## 2018-01-26 DIAGNOSIS — D509 Iron deficiency anemia, unspecified: Secondary | ICD-10-CM | POA: Insufficient documentation

## 2018-01-26 HISTORY — DX: Iron deficiency anemia, unspecified: D50.9

## 2018-01-27 ENCOUNTER — Telehealth: Payer: Self-pay

## 2018-01-27 ENCOUNTER — Other Ambulatory Visit: Payer: Self-pay

## 2018-01-27 DIAGNOSIS — D500 Iron deficiency anemia secondary to blood loss (chronic): Secondary | ICD-10-CM

## 2018-01-27 DIAGNOSIS — R195 Other fecal abnormalities: Secondary | ICD-10-CM

## 2018-01-27 DIAGNOSIS — Z1211 Encounter for screening for malignant neoplasm of colon: Secondary | ICD-10-CM

## 2018-01-27 LAB — IFOBT (OCCULT BLOOD): IMMUNOLOGICAL FECAL OCCULT BLOOD TEST: POSITIVE

## 2018-01-27 NOTE — Telephone Encounter (Signed)
-----   Message from Birdie Sons, MD sent at 01/27/2018 12:08 PM EDT ----- Stool positive for occult blood. Needs referral to Gi.

## 2018-01-27 NOTE — Telephone Encounter (Signed)
Patient advised of results and agrees to referral. Order placed. Please schedule.

## 2018-01-27 NOTE — Progress Notes (Unsigned)
Patient returned OC light test today and the result is positive.

## 2018-01-28 LAB — SPECIMEN STATUS REPORT

## 2018-01-28 LAB — FOLATE: Folate: >20 ng/mL (ref >3.0)

## 2018-01-28 LAB — VITAMIN B12: Vitamin B-12: 547 pg/mL (ref 232–1245)

## 2018-01-28 LAB — FERRITIN: Ferritin: 5 ng/mL — ABNORMAL LOW (ref 30–400)

## 2018-02-05 ENCOUNTER — Ambulatory Visit: Payer: Medicare Other | Admitting: Family Medicine

## 2018-02-22 ENCOUNTER — Encounter (INDEPENDENT_AMBULATORY_CARE_PROVIDER_SITE_OTHER): Payer: Self-pay

## 2018-02-22 ENCOUNTER — Encounter: Payer: Self-pay | Admitting: Gastroenterology

## 2018-02-22 ENCOUNTER — Ambulatory Visit (INDEPENDENT_AMBULATORY_CARE_PROVIDER_SITE_OTHER): Payer: Medicare Other | Admitting: Gastroenterology

## 2018-02-22 VITALS — BP 176/79 | HR 80 | Ht 65.0 in | Wt 205.6 lb

## 2018-02-22 DIAGNOSIS — D509 Iron deficiency anemia, unspecified: Secondary | ICD-10-CM

## 2018-02-22 NOTE — Progress Notes (Signed)
Christian Bellows MD, MRCP(U.K) [address]  [place]  [place], [place] [address]  Main: [phone]  Fax: [phone]   Gastroenterology Consultation  Referring Provider:     Birdie Sons, MD Primary Care Physician:  Christian Sons, MD Primary Gastroenterologist:  Dr. Jonathon Mora  Reason for Consultation:     Stool occult test positive         HPI:   Christian Mora is a 72 y.o. y/o male referred for consultation & management  by Dr. Caryn Mora, Christian Peri, MD.    He has been referred for a stool occult blood test that is positive that was performed for a new onset anemia. A very low ferritin with normal B12, folate and low TSH noted.   Rectal bleeding: no  Nose bleeds: no  Hematemesis or hemoptysis : no  Blood in urine : no  Takes Advil few times a week , many years. , Denies any weight loss. Last colonoscopy - 12 years- was normal , no family history of colon cancer or polyps.   He has been started on oral iron  Iron/TIBC/Ferritin/ %Sat    Component Value Date/Time   FERRITIN 5 (L) 01/22/2018 0809   CBC Latest Ref Rng & Units 01/22/2018 12/10/2016 07/31/2015  WBC 3.4 - 10.8 x10E3/uL 4.2 6.7 5.8  Hemoglobin 13.0 - 17.7 g/dL 9.1(L) 12.6(L) 12.9  Hematocrit 37.5 - 51.0 % 31.7(L) 40.5 38.1  Platelets 150 - 379 x10E3/uL 164 211 171      Past Medical History:  Diagnosis Date  . Allergy   . Anemia   . Anxiety   . GERD (gastroesophageal reflux disease)   . Hyperlipidemia   . Hypertension   . Thyroid disease     Past Surgical History:  Procedure Laterality Date  . lymph node removed  2004  . [place]    Prior to Admission medications   Medication Sig Start Date End Date Taking? Authorizing Provider  ALPRAZolam Duanne Moron) 0.5 MG tablet TAKE 1/2 TO 1 TABLET BY MOUTH 3 TIMES DAILY AS NEEDED 11/05/17   Christian Sons, MD  aspirin 81 MG tablet Take 1 tablet by mouth daily.    [provider]  fluticasone (FLONASE) 50 MCG/ACT nasal  spray Place 2 sprays into both nostrils daily. 05/22/17   Christian Sons, MD  hydrocortisone (PROCTO-MED HC) 2.5 % rectal cream APPLY 2 OR 3 TIMES A DAY AS NEEDED 11/05/17   Christian Sons, MD  levothyroxine (SYNTHROID, LEVOTHROID) 150 MCG tablet Take 1 tablet (150 mcg total) by mouth daily. 11/05/17   Christian Sons, MD  lovastatin (MEVACOR) 20 MG tablet TAKE ONE TABLET BY MOUTH EVERY DAY 10/29/17   Christian Sons, MD  MULTIPLE VITAMIN PO Take 1 tablet by mouth daily.    [provider]  naproxen (NAPROSYN) 500 MG tablet TAKE ONE TABLET TWICE DAILY WITH A MEAL 02/10/17   Christian Sons, MD  omeprazole (PRILOSEC) 20 MG capsule TAKE 1-2 CAPSULES BY MOUTH DAILY AS NEEDED FOR HEARTBURN 01/23/18   Christian Sons, MD  Probiotic Product (PROBIOTIC PO) Take by mouth.    [provider]  tamsulosin (FLOMAX) 0.4 MG CAPS capsule Take 2 capsules (0.8 mg total) by mouth daily. 11/05/17   Christian Sons, MD    Family History  Problem Relation Age of Onset  . Hypertension Mother   . Cancer Father        prostate  . Diabetes Son  Social History   Tobacco Use  . Smoking status: Never Smoker  . Smokeless tobacco: Never Used  Substance Use Topics  . Alcohol use: No  . Drug use: No    Allergies as of 02/22/2018  . (No Known Allergies)    Review of Systems:    All systems reviewed and negative except where noted in HPI.   Physical Exam:  There were no vitals taken for this visit. No LMP for male patient. Psych:  Alert and cooperative. Normal mood and affect. General:   Alert,  Well-developed, well-nourished, pleasant and cooperative in NAD Head:  Normocephalic and atraumatic. Eyes:  Sclera clear, no icterus.   Conjunctiva pink. Ears:  Normal auditory acuity. Nose:  No deformity, discharge, or lesions. Mouth:  No deformity or lesions,oropharynx pink & moist. Neck:  Supple; no masses or thyromegaly. Lungs:  Respirations even and unlabored.  Clear throughout to  auscultation.   No wheezes, crackles, or rhonchi. No acute distress. Heart:  Regular rate and rhythm; no murmurs, clicks, rubs, or gallops. Abdomen:  Normal bowel sounds.  No bruits.  Soft, non-tender and non-distended without masses, hepatosplenomegaly or hernias noted.  No guarding or rebound tenderness.    Neurologic:  Alert and oriented x3;  grossly normal neurologically. Skin:  Intact without significant lesions or rashes. No jaundice. Lymph Nodes:  No significant cervical adenopathy. Psych:  Alert and cooperative. Normal mood and affect.  Imaging Studies: No results found.  Assessment and Plan:   Christian Mora is a 72 y.o. y/o male has been referred for blood in his stool. New diagnosis of iron deficiency anemia. On oral iron . No overt blood loss   Plan  1. Check celiac serology , urine for blood loss 2. Continue oral iron and if levels no better in 6-8 weeks consider IV iron  3. EGD+colonoscopy +/- capsule study to evaluate iron deficiency anemia.    I have discussed alternative options, risks & benefits,  which include, but are not limited to, bleeding, infection, perforation,respiratory complication & drug reaction.  The patient agrees with this plan & written consent will be obtained.     Follow up in 8-12 weeks   Dr Christian Bellows MD,MRCP(U.K)

## 2018-02-23 ENCOUNTER — Telehealth: Payer: Self-pay | Admitting: Family Medicine

## 2018-02-23 NOTE — Telephone Encounter (Signed)
Please advise 

## 2018-02-23 NOTE — Telephone Encounter (Signed)
Pt is scheduled for follow up with Dr. Caryn Section on 03/02/18 and is scheduled for endoscopy and colonoscopy on 03/03/18. Pt is asking if Dr. Caryn Section is wanting to do blood work on 03/02/18. Pt is asking b/c if Dr. Caryn Section isn't needed labs on 03/02/18 pt would like to reschedule his appt for after 03/03/18. Please advise. Thanks TNP

## 2018-02-25 NOTE — Telephone Encounter (Signed)
That's fine, I don't think we need any labs.

## 2018-02-25 NOTE — Telephone Encounter (Signed)
Pt called back asking about message that was sent on 02/23/18. Pt was advised below. Pt canceled the appt for 03/02/18 and stated he will call back to reschedule. Thanks TNP

## 2018-03-02 ENCOUNTER — Ambulatory Visit: Payer: Self-pay | Admitting: Family Medicine

## 2018-03-03 ENCOUNTER — Ambulatory Visit
Admission: RE | Admit: 2018-03-03 | Discharge: 2018-03-03 | Disposition: A | Discharge status: Home / Self Care | Payer: Medicare Other | Source: Ambulatory Visit | Attending: Gastroenterology | Admitting: Gastroenterology

## 2018-03-03 ENCOUNTER — Ambulatory Visit: Payer: Medicare Other | Admitting: Anesthesiology

## 2018-03-03 ENCOUNTER — Encounter
Admission: RE | Disposition: A | Discharge status: Home / Self Care | Payer: Self-pay | Source: Ambulatory Visit | Attending: Gastroenterology

## 2018-03-03 ENCOUNTER — Other Ambulatory Visit: Payer: Self-pay

## 2018-03-03 ENCOUNTER — Encounter: Payer: Self-pay | Admitting: Student

## 2018-03-03 DIAGNOSIS — E039 Hypothyroidism, unspecified: Secondary | ICD-10-CM | POA: Diagnosis not present

## 2018-03-03 DIAGNOSIS — K3189 Other diseases of stomach and duodenum: Secondary | ICD-10-CM | POA: Diagnosis not present

## 2018-03-03 DIAGNOSIS — D122 Benign neoplasm of ascending colon: Secondary | ICD-10-CM | POA: Insufficient documentation

## 2018-03-03 DIAGNOSIS — K219 Gastro-esophageal reflux disease without esophagitis: Secondary | ICD-10-CM | POA: Diagnosis not present

## 2018-03-03 DIAGNOSIS — D123 Benign neoplasm of transverse colon: Secondary | ICD-10-CM

## 2018-03-03 DIAGNOSIS — K2980 Duodenitis without bleeding: Secondary | ICD-10-CM | POA: Diagnosis not present

## 2018-03-03 DIAGNOSIS — D509 Iron deficiency anemia, unspecified: Secondary | ICD-10-CM | POA: Insufficient documentation

## 2018-03-03 DIAGNOSIS — K640 First degree hemorrhoids: Secondary | ICD-10-CM | POA: Insufficient documentation

## 2018-03-03 DIAGNOSIS — F419 Anxiety disorder, unspecified: Secondary | ICD-10-CM | POA: Diagnosis not present

## 2018-03-03 DIAGNOSIS — Z79899 Other long term (current) drug therapy: Secondary | ICD-10-CM | POA: Diagnosis not present

## 2018-03-03 DIAGNOSIS — Z7982 Long term (current) use of aspirin: Secondary | ICD-10-CM | POA: Insufficient documentation

## 2018-03-03 DIAGNOSIS — K449 Diaphragmatic hernia without obstruction or gangrene: Secondary | ICD-10-CM | POA: Insufficient documentation

## 2018-03-03 DIAGNOSIS — E785 Hyperlipidemia, unspecified: Secondary | ICD-10-CM | POA: Diagnosis not present

## 2018-03-03 DIAGNOSIS — I10 Essential (primary) hypertension: Secondary | ICD-10-CM | POA: Diagnosis not present

## 2018-03-03 DIAGNOSIS — K635 Polyp of colon: Secondary | ICD-10-CM | POA: Diagnosis not present

## 2018-03-03 DIAGNOSIS — K649 Unspecified hemorrhoids: Secondary | ICD-10-CM | POA: Diagnosis not present

## 2018-03-03 HISTORY — PX: COLONOSCOPY WITH PROPOFOL: SHX5780

## 2018-03-03 HISTORY — DX: Hypothyroidism, unspecified: E03.9

## 2018-03-03 HISTORY — PX: ESOPHAGOGASTRODUODENOSCOPY (EGD) WITH PROPOFOL: SHX5813

## 2018-03-03 SURGERY — COLONOSCOPY WITH PROPOFOL
Anesthesia: General

## 2018-03-03 MED ORDER — SODIUM CHLORIDE 0.9 % IV SOLN
INTRAVENOUS | Status: DC
  Administered 2018-03-03: 14:00:00 via INTRAVENOUS

## 2018-03-03 MED ORDER — PROPOFOL 500 MG/50ML IV EMUL
INTRAVENOUS | Status: DC | PRN
  Administered 2018-03-03: 140 ug/kg/min via INTRAVENOUS

## 2018-03-03 MED ORDER — MIDAZOLAM HCL 2 MG/2ML IJ SOLN
INTRAMUSCULAR | Status: DC | PRN
  Administered 2018-03-03: 2 mg via INTRAVENOUS

## 2018-03-03 MED ORDER — LIDOCAINE HCL (CARDIAC) PF 100 MG/5ML IV SOSY
PREFILLED_SYRINGE | INTRAVENOUS | Status: DC | PRN
  Administered 2018-03-03: 100 mg via INTRAVENOUS

## 2018-03-03 MED ORDER — PROPOFOL 10 MG/ML IV BOLUS
INTRAVENOUS | Status: DC | PRN
  Administered 2018-03-03: 70 mg via INTRAVENOUS
  Administered 2018-03-03: 30 mg via INTRAVENOUS

## 2018-03-03 MED ORDER — MIDAZOLAM HCL 2 MG/2ML IJ SOLN
INTRAMUSCULAR | Status: AC
  Filled 2018-03-03: qty 2

## 2018-03-03 MED ORDER — LIDOCAINE HCL (PF) 2 % IJ SOLN
INTRAMUSCULAR | Status: AC
  Filled 2018-03-03: qty 10

## 2018-03-03 NOTE — Anesthesia Post-op Follow-up Note (Signed)
Anesthesia QCDR form completed.        

## 2018-03-03 NOTE — Anesthesia Postprocedure Evaluation (Signed)
Anesthesia Post Note  Patient: [facility]  Procedure(s) Performed: COLONOSCOPY WITH PROPOFOL (N/A ) ESOPHAGOGASTRODUODENOSCOPY (EGD) WITH PROPOFOL (N/A )  Patient location during evaluation: Endoscopy Anesthesia Type: General Level of consciousness: awake and alert Pain management: pain level controlled Vital Signs Assessment: post-procedure vital signs reviewed and stable Respiratory status: spontaneous breathing, nonlabored ventilation, respiratory function stable and patient connected to nasal cannula oxygen Cardiovascular status: blood pressure returned to baseline and stable Postop Assessment: no apparent nausea or vomiting Anesthetic complications: no     Last Vitals:  Vitals:   03/03/18 1546 03/03/18 1606  BP: (!) 124/58 (!) 155/87  Pulse: 79   Resp: (!) 23   Temp:    SpO2: 96%     Last Pain:  Vitals:   03/03/18 1606  TempSrc:   PainSc: 0-No pain                 Martha Clan

## 2018-03-03 NOTE — Anesthesia Preprocedure Evaluation (Addendum)
Anesthesia Evaluation  Patient identified by MRN, date of birth, ID band Patient awake    Reviewed: Allergy & Precautions, NPO status , Patient's Chart, lab work & pertinent test results  Airway Mallampati: III       Dental   Pulmonary neg sleep apnea, neg COPD,           Cardiovascular hypertension, Pt. on medications (-) Past MI and (-) CHF (-) dysrhythmias (-) Valvular Problems/Murmurs     Neuro/Psych neg Seizures Anxiety    GI/Hepatic GERD  Medicated and Controlled,  Endo/Other  neg diabetesHypothyroidism   Renal/GU negative Renal ROS     Musculoskeletal   Abdominal   Peds  Hematology  (+) anemia ,   Anesthesia Other Findings   Reproductive/Obstetrics                             Anesthesia Physical Anesthesia Plan  ASA: III  Anesthesia Plan:    Post-op Pain Management:    Induction: Intravenous  PONV Risk Score and Plan: 1 and TIVA  Airway Management Planned: Nasal Cannula  Additional Equipment:   Intra-op Plan:   Post-operative Plan:   Informed Consent: I have reviewed the patients History and Physical, chart, labs and discussed the procedure including the risks, benefits and alternatives for the proposed anesthesia with the patient or authorized representative who has indicated his/her understanding and acceptance.     Plan Discussed with:   Anesthesia Plan Comments:         Anesthesia Quick Evaluation

## 2018-03-03 NOTE — Transfer of Care (Signed)
Immediate Anesthesia Transfer of Care Note  Patient: [place]  Procedure(s) Performed: COLONOSCOPY WITH PROPOFOL (N/A ) ESOPHAGOGASTRODUODENOSCOPY (EGD) WITH PROPOFOL (N/A )  Patient Location: PACU  Anesthesia Type:General  Level of Consciousness: sedated  Airway & Oxygen Therapy: Patient Spontanous Breathing and Patient connected to nasal cannula oxygen  Post-op Assessment: Report given to RN and Post -op Vital signs reviewed and stable  Post vital signs: Reviewed and stable  Last Vitals:  Vitals Value Taken Time  BP 124/58 03/03/2018  3:46 PM  Temp 36 C 03/03/2018  3:44 PM  Pulse 79 03/03/2018  3:46 PM  Resp 23 03/03/2018  3:46 PM  SpO2 96 % 03/03/2018  3:46 PM    Last Pain:  Vitals:   03/03/18 1544  TempSrc: Tympanic  PainSc: 0-No pain         Complications: No apparent anesthesia complications

## 2018-03-03 NOTE — Op Note (Signed)
[facility] Gastroenterology Patient Name: Christian Mora Procedure Date: 03/03/2018 3:03 PM MRN: [id number] Account #: [id number] Date of Birth: 11/08/1945 Admit Type: Outpatient Age: 72 Room: [facility] ENDO ROOM 1 Gender: Male Note Status: Finalized Procedure:            Colonoscopy Indications:          Iron deficiency anemia Providers:            Jonathon Bellows MD, MD Referring MD:         Kirstie Peri. Caryn Section, MD (Referring MD) Medicines:            Monitored Anesthesia Care Complications:        No immediate complications. Procedure:            Pre-Anesthesia Assessment:                       - Prior to the procedure, a History and Physical was                        performed, and patient medications, allergies and                        sensitivities were reviewed. The patient's tolerance of                        previous anesthesia was reviewed.                       - The risks and benefits of the procedure and the                        sedation options and risks were discussed with the                        patient. All questions were answered and informed                        consent was obtained.                       - ASA Grade Assessment: III - A patient with severe                        systemic disease.                       After obtaining informed consent, the colonoscope was                        passed under direct vision. Throughout the procedure,                        the patient's blood pressure, pulse, and oxygen                        saturations were monitored continuously. The                        Colonoscope was introduced through the anus and  advanced to the the cecum, identified by the                        appendiceal orifice, IC valve and transillumination.                        The colonoscopy was performed with ease. The patient                        tolerated the procedure well. The quality of the bowel                       preparation was good. Findings:      The perianal and digital rectal examinations were normal.      Four sessile polyps were found in the ascending colon. The polyps were 4       to 6 mm in size. These polyps were removed with a cold snare. Resection       and retrieval were complete.      A 5 mm polyp was found in the transverse colon. The polyp was sessile.       [Method]. Resection and retrieval were complete.      A 3 mm polyp was found in the transverse colon. The polyp was sessile.       The polyp was removed with a cold biopsy forceps. Resection and       retrieval were complete.      The exam was otherwise without abnormality on direct and retroflexion       views.      Non-bleeding internal hemorrhoids were found during retroflexion. The       hemorrhoids were medium-sized and Grade I (internal hemorrhoids that do       not prolapse). Impression:           - Four 4 to 6 mm polyps in the ascending colon, removed                        with a cold snare. Resected and retrieved.                       - One 5 mm polyp in the transverse colon. Resected and                        retrieved.                       - One 3 mm polyp in the transverse colon, removed with                        a cold biopsy forceps. Resected and retrieved.                       - The examination was otherwise normal on direct and                        retroflexion views. Recommendation:       - Discharge patient to home (with escort).                       - Resume previous diet.                       -  Continue present medications.                       - Await pathology results.                       - Repeat colonoscopy in 3 years for surveillance.                       - To visualize the small bowel, perform video capsule                        endoscopy in 1 week. Procedure Code(s):    --- Professional ---                       [phone], Colonoscopy, flexible; with removal of  tumor(s),                        polyp(s), or other lesion(s) by snare technique                       [id number], 34, Colonoscopy, flexible; with biopsy, single                        or multiple Diagnosis Code(s):    --- Professional ---                       D12.2, Benign neoplasm of ascending colon                       D12.3, Benign neoplasm of transverse colon (hepatic                        flexure or splenic flexure)                       D50.9, Iron deficiency anemia, unspecified CPT copyright 2017 American Medical Association. All rights reserved. The codes documented in this report are preliminary and upon coder review may  be revised to meet current compliance requirements. Jonathon Bellows, MD Jonathon Bellows MD, MD 03/03/2018 3:44:47 PM This report has been signed electronically. Number of Addenda: 0 Note Initiated On: 03/03/2018 3:03 PM Scope Withdrawal Time: 0 hours 21 minutes 35 seconds  Total Procedure Duration: 0 hours 23 minutes 2 seconds       [facility]

## 2018-03-03 NOTE — Op Note (Signed)
[facility] Gastroenterology Patient Name: Christian Mora Procedure Date: 03/03/2018 3:02 PM MRN: [id number] Account #: [id number] Date of Birth: 1946-03-04 Admit Type: Outpatient Age: 72 Room: [facility] ENDO ROOM 1 Gender: Male Note Status: Finalized Procedure:            Upper GI endoscopy Indications:          Iron deficiency anemia Providers:            Jonathon Bellows MD, MD Referring MD:         Kirstie Peri. Caryn Section, MD (Referring MD) Medicines:            Monitored Anesthesia Care Complications:        No immediate complications. Procedure:            Pre-Anesthesia Assessment:                       - Prior to the procedure, a History and Physical was                        performed, and patient medications, allergies and                        sensitivities were reviewed. The patient's tolerance of                        previous anesthesia was reviewed.                       - The risks and benefits of the procedure and the                        sedation options and risks were discussed with the                        patient. All questions were answered and informed                        consent was obtained.                       - ASA Grade Assessment: III - A patient with severe                        systemic disease.                       After obtaining informed consent, the endoscope was                        passed under direct vision. Throughout the procedure,                        the patient's blood pressure, pulse, and oxygen                        saturations were monitored continuously. The Endoscope                        was introduced through the mouth, and advanced to the  third part of duodenum. The upper GI endoscopy was                        accomplished with ease. The patient tolerated the                        procedure well. Findings:      The examined duodenum was normal.      The examined duodenum was normal.  Biopsies were taken with a cold       forceps for histology.      The esophagus was normal.      A 10 cm hiatal hernia was present. Impression:           - Normal examined duodenum.                       - Normal examined duodenum. Biopsied.                       - Normal esophagus.                       - 10 cm hiatal hernia. Recommendation:       - Await pathology results.                       - Perform a colonoscopy today. Procedure Code(s):    --- Professional ---                       [phone], Esophagogastroduodenoscopy, flexible, transoral;                        with biopsy, single or multiple Diagnosis Code(s):    --- Professional ---                       K44.9, Diaphragmatic hernia without obstruction or                        gangrene                       D50.9, Iron deficiency anemia, unspecified CPT copyright 2017 American Medical Association. All rights reserved. The codes documented in this report are preliminary and upon coder review may  be revised to meet current compliance requirements. Jonathon Bellows, MD Jonathon Bellows MD, MD 03/03/2018 3:16:47 PM This report has been signed electronically. Number of Addenda: 0 Note Initiated On: 03/03/2018 3:02 PM      [facility]

## 2018-03-03 NOTE — Anesthesia Procedure Notes (Addendum)
Performed by: Cherrell Maybee, CRNA Pre-anesthesia Checklist: Patient identified, Emergency Drugs available, Suction available, Patient being monitored and Timeout performed Patient Re-evaluated:Patient Re-evaluated prior to induction Oxygen Delivery Method: Nasal cannula Induction Type: IV induction Ventilation: Nasal airway inserted- appropriate to patient size        

## 2018-03-03 NOTE — H&P (Signed)
Jonathon Bellows, MD [address] [place], [place], [place], [address] [address], [place], [place], [place], [address] Phone: [phone]  Fax: [phone]  Primary Care Physician:  Birdie Sons, MD   Pre-Procedure History & Physical: HPI:  Christian Mora is a 72 y.o. male is here for an endoscopy and colonoscopy    Past Medical History:  Diagnosis Date  . Allergy   . Anemia   . Anxiety   . GERD (gastroesophageal reflux disease)   . Hyperlipidemia   . Hypertension   . Hypothyroidism   . Thyroid disease     Past Surgical History:  Procedure Laterality Date  . lymph node removed  2004  . [place]    Prior to Admission medications   Medication Sig Start Date End Date Taking? Authorizing Provider  hydrocortisone (PROCTO-MED HC) 2.5 % rectal cream APPLY 2 OR 3 TIMES A DAY AS NEEDED 11/05/17  Yes Fisher, Kirstie Peri, MD  levothyroxine (SYNTHROID, LEVOTHROID) 150 MCG tablet Take 1 tablet (150 mcg total) by mouth daily. 11/05/17  Yes Birdie Sons, MD  lovastatin (MEVACOR) 20 MG tablet TAKE ONE TABLET BY MOUTH EVERY DAY 10/29/17  Yes Birdie Sons, MD  omeprazole (PRILOSEC) 20 MG capsule TAKE 1-2 CAPSULES BY MOUTH DAILY AS NEEDED FOR HEARTBURN 01/23/18  Yes Birdie Sons, MD  ALPRAZolam Duanne Moron) 0.5 MG tablet TAKE 1/2 TO 1 TABLET BY MOUTH 3 TIMES DAILY AS NEEDED 11/05/17   Birdie Sons, MD  aspirin 81 MG tablet Take 1 tablet by mouth daily.    [provider]  ferrous sulfate 325 (65 FE) MG tablet Take 325 mg by mouth daily with breakfast.    [provider]  fluticasone (FLONASE) 50 MCG/ACT nasal spray Place 2 sprays into both nostrils daily. 05/22/17   Birdie Sons, MD  MULTIPLE VITAMIN PO Take 1 tablet by mouth daily.    [provider]  naproxen (NAPROSYN) 500 MG tablet TAKE ONE TABLET TWICE DAILY WITH A MEAL Patient not taking: Reported on 03/03/2018 02/10/17   Birdie Sons, MD  Probiotic Product  (PROBIOTIC PO) Take by mouth.    [provider]  tamsulosin (FLOMAX) 0.4 MG CAPS capsule Take 2 capsules (0.8 mg total) by mouth daily. 11/05/17   Birdie Sons, MD    Allergies as of 02/22/2018  . (No Known Allergies)    Family History  Problem Relation Age of Onset  . Hypertension Mother   . Cancer Father        prostate  . Diabetes Son     Social History   Socioeconomic History  . Marital status: Married    Spouse name: Not on file  . Number of children: Not on file  . Years of education: Not on file  . Highest education level: Not on file  Occupational History  . Not on file  Social Needs  . Financial resource strain: Not on file  . Food insecurity:    Worry: Not on file    Inability: Not on file  . Transportation needs:    Medical: Not on file    Non-medical: Not on file  Tobacco Use  . Smoking status: Never Smoker  . Smokeless tobacco: Never Used  Substance and Sexual Activity  . Alcohol use: No  . Drug use: No  . Sexual activity: Yes  Lifestyle  . Physical activity:    Days per week: Not on file  Minutes per session: Not on file  . Stress: Not on file  Relationships  . Social connections:    Talks on phone: Not on file    Gets together: Not on file    Attends religious service: Not on file    Active member of club or organization: Not on file    Attends meetings of clubs or organizations: Not on file    Relationship status: Not on file  . Intimate partner violence:    Fear of current or ex partner: Not on file    Emotionally abused: Not on file    Physically abused: Not on file    Forced sexual activity: Not on file  Other Topics Concern  . Not on file  Social History Narrative  . Not on file    Review of Systems: See HPI, otherwise negative ROS  Physical Exam: BP (!) 153/89   Pulse 80   Temp (!) 96.5 F (35.8 C) (Tympanic)   Resp 18   Ht 5\' 5"  (1.651 m)   Wt 194 lb (88 kg)   SpO2 97%   BMI 32.28 kg/m  General:    Alert,  pleasant and cooperative in NAD Head:  Normocephalic and atraumatic. Neck:  Supple; no masses or thyromegaly. Lungs:  Clear throughout to auscultation, normal respiratory effort.    Heart:  +S1, +S2, Regular rate and rhythm, No edema. Abdomen:  Soft, nontender and nondistended. Normal bowel sounds, without guarding, and without rebound.   Neurologic:  Alert and  oriented x4;  grossly normal neurologically.  Impression/Plan: Christian Mora is here for an endoscopy and colonoscopy  to be performed for  evaluation of iron deficiency anemia    Risks, benefits, limitations, and alternatives regarding endoscopy have been reviewed with the patient.  Questions have been answered.  All parties agreeable.   Jonathon Bellows, MD  03/03/2018, 2:52 PM

## 2018-03-04 ENCOUNTER — Telehealth: Payer: Self-pay | Admitting: Gastroenterology

## 2018-03-04 ENCOUNTER — Encounter: Payer: Self-pay | Admitting: Gastroenterology

## 2018-03-04 NOTE — Telephone Encounter (Signed)
Pt is calling to set up capsule study per  Dr. Idelle Crouch pt to set up within a week

## 2018-03-05 ENCOUNTER — Other Ambulatory Visit: Payer: Self-pay

## 2018-03-05 ENCOUNTER — Telehealth: Payer: Self-pay | Admitting: Gastroenterology

## 2018-03-05 DIAGNOSIS — D509 Iron deficiency anemia, unspecified: Secondary | ICD-10-CM

## 2018-03-05 LAB — SURGICAL PATHOLOGY

## 2018-03-05 NOTE — Progress Notes (Signed)
Advised Christian Mora of prep process for Capsule Study over the phone.   He wrote everything down and expressed understanding.   Ordered Capsule Study per Dr. Vicente Males.

## 2018-03-05 NOTE — Telephone Encounter (Signed)
Pt left vm stating he is scheduled for capsule study 5/10 and was told to take rx Miralx he wants to know if he is supposed to take rx Doltalex too please call pt

## 2018-03-07 ENCOUNTER — Encounter: Payer: Self-pay | Admitting: Gastroenterology

## 2018-03-09 ENCOUNTER — Encounter: Payer: Self-pay | Admitting: Family Medicine

## 2018-03-09 DIAGNOSIS — Z860101 Personal history of adenomatous and serrated colon polyps: Secondary | ICD-10-CM | POA: Insufficient documentation

## 2018-03-09 DIAGNOSIS — Z86010 Personal history of colonic polyps: Secondary | ICD-10-CM | POA: Insufficient documentation

## 2018-03-12 ENCOUNTER — Ambulatory Visit: Payer: Medicare Other | Admitting: Anesthesiology

## 2018-03-12 ENCOUNTER — Encounter
Admission: RE | Disposition: A | Discharge status: Home / Self Care | Payer: Self-pay | Source: Ambulatory Visit | Attending: Gastroenterology

## 2018-03-12 ENCOUNTER — Ambulatory Visit
Admission: RE | Admit: 2018-03-12 | Discharge: 2018-03-12 | Disposition: A | Discharge status: Home / Self Care | Payer: Medicare Other | Source: Ambulatory Visit | Attending: Gastroenterology | Admitting: Gastroenterology

## 2018-03-12 DIAGNOSIS — D509 Iron deficiency anemia, unspecified: Secondary | ICD-10-CM | POA: Insufficient documentation

## 2018-03-12 HISTORY — PX: GIVENS CAPSULE STUDY: SHX5432

## 2018-03-12 SURGERY — IMAGING PROCEDURE, GI TRACT, INTRALUMINAL, VIA CAPSULE
Anesthesia: General

## 2018-03-12 NOTE — Anesthesia Preprocedure Evaluation (Deleted)
Anesthesia Evaluation  Patient identified by MRN, date of birth, ID band Patient awake    Reviewed: Allergy & Precautions, H&P , NPO status , Patient's Chart, lab work & pertinent test results, reviewed documented beta blocker date and time   Airway Mallampati: II   Neck ROM: full    Dental  (+) Teeth Intact   Pulmonary neg pulmonary ROS,    Pulmonary exam normal        Cardiovascular Exercise Tolerance: Good hypertension, On Medications negative cardio ROS Normal cardiovascular exam Rhythm:regular Rate:Normal     Neuro/Psych negative neurological ROS  negative psych ROS   GI/Hepatic negative GI ROS, Neg liver ROS, GERD  Medicated,  Endo/Other  negative endocrine ROSHypothyroidism   Renal/GU negative Renal ROS  negative genitourinary   Musculoskeletal   Abdominal   Peds  Hematology negative hematology ROS (+) anemia ,   Anesthesia Other Findings Past Medical History: No date: Allergy No date: Anemia No date: Anxiety No date: GERD (gastroesophageal reflux disease) No date: Hyperlipidemia No date: Hypertension No date: Hypothyroidism No date: Thyroid disease Past Surgical History: 03/03/2018: COLONOSCOPY WITH PROPOFOL; N/A     Comment:  Procedure: COLONOSCOPY WITH PROPOFOL;  Surgeon: Jonathon Bellows, MD;  Location: [facility] ENDOSCOPY;  Service:               Gastroenterology;  Laterality: N/A; 03/03/2018: ESOPHAGOGASTRODUODENOSCOPY (EGD) WITH PROPOFOL; N/A     Comment:  Procedure: ESOPHAGOGASTRODUODENOSCOPY (EGD) WITH               PROPOFOL;  Surgeon: Jonathon Bellows, MD;  Location: [facility]               ENDOSCOPY;  Service: Gastroenterology;  Laterality: N/A; 2004: lymph node removed 1954: TONSILLECTOMY AND ADENOIDECTOMY   Reproductive/Obstetrics negative OB ROS                             Anesthesia Physical Anesthesia Plan  ASA: III  Anesthesia Plan: General   Post-op Pain  Management:    Induction:   PONV Risk Score and Plan:   Airway Management Planned:   Additional Equipment:   Intra-op Plan:   Post-operative Plan:   Informed Consent: I have reviewed the patients History and Physical, chart, labs and discussed the procedure including the risks, benefits and alternatives for the proposed anesthesia with the patient or authorized representative who has indicated his/her understanding and acceptance.   Dental Advisory Given  Plan Discussed with: CRNA  Anesthesia Plan Comments:         Anesthesia Quick Evaluation

## 2018-03-17 ENCOUNTER — Encounter: Payer: Self-pay | Admitting: Gastroenterology

## 2018-03-19 ENCOUNTER — Telehealth: Payer: Self-pay

## 2018-03-19 NOTE — Telephone Encounter (Signed)
Advised Mr. Schlosser of capsule study results.   Mr. Bartling states that he would prefer to not have another study, if it wasn't absolutely necessary.   Requested a copy of the result be mailed to him.  The capsule did pass the next day; reported by Mr. Bristol.

## 2018-03-23 ENCOUNTER — Encounter: Payer: Self-pay | Admitting: Gastroenterology

## 2018-04-28 DIAGNOSIS — D485 Neoplasm of uncertain behavior of skin: Secondary | ICD-10-CM | POA: Diagnosis not present

## 2018-04-28 DIAGNOSIS — Z08 Encounter for follow-up examination after completed treatment for malignant neoplasm: Secondary | ICD-10-CM | POA: Diagnosis not present

## 2018-04-28 DIAGNOSIS — D0461 Carcinoma in situ of skin of right upper limb, including shoulder: Secondary | ICD-10-CM | POA: Diagnosis not present

## 2018-04-28 DIAGNOSIS — Z85828 Personal history of other malignant neoplasm of skin: Secondary | ICD-10-CM | POA: Diagnosis not present

## 2018-04-28 DIAGNOSIS — Z872 Personal history of diseases of the skin and subcutaneous tissue: Secondary | ICD-10-CM | POA: Diagnosis not present

## 2018-04-28 DIAGNOSIS — D0462 Carcinoma in situ of skin of left upper limb, including shoulder: Secondary | ICD-10-CM | POA: Diagnosis not present

## 2018-04-28 DIAGNOSIS — L570 Actinic keratosis: Secondary | ICD-10-CM | POA: Diagnosis not present

## 2018-04-28 DIAGNOSIS — Z09 Encounter for follow-up examination after completed treatment for conditions other than malignant neoplasm: Secondary | ICD-10-CM | POA: Diagnosis not present

## 2018-04-28 DIAGNOSIS — Z85820 Personal history of malignant melanoma of skin: Secondary | ICD-10-CM | POA: Diagnosis not present

## 2018-05-21 DIAGNOSIS — D0462 Carcinoma in situ of skin of left upper limb, including shoulder: Secondary | ICD-10-CM | POA: Diagnosis not present

## 2018-05-21 DIAGNOSIS — D0461 Carcinoma in situ of skin of right upper limb, including shoulder: Secondary | ICD-10-CM | POA: Diagnosis not present

## 2018-05-28 DIAGNOSIS — H903 Sensorineural hearing loss, bilateral: Secondary | ICD-10-CM | POA: Diagnosis not present

## 2018-06-02 DIAGNOSIS — H2513 Age-related nuclear cataract, bilateral: Secondary | ICD-10-CM | POA: Diagnosis not present

## 2018-06-14 NOTE — Progress Notes (Signed)
[name] Male    DOB: 1946-05-20   72 y.o.   MRN: [id number] Visit Date: 06/15/2018  Today's Provider: Lelon Huh, MD   Chief Complaint  Patient presents with  . Hypertension  . Hypothyroidism  . Anxiety  . Anemia   Subjective:    HPI    Hypertension, follow-up:  BP Readings from Last 3 Encounters:  06/15/18 (!) 173/83  03/03/18 (!) 155/87  02/22/18 (!) 176/79    He was last seen for hypertension 7 months ago.  BP at that visit was 140/80. Management since that visit includes; no changes.He reports good compliance with treatment. He is not having side effects.  He is exercising. He is not adherent to low salt diet.   Outside blood pressures are 120-135/70-83. He is experiencing none.  Patient denies chest pain, chest pressure/discomfort, claudication, dyspnea, exertional chest pressure/discomfort, fatigue, irregular heart beat, lower extremity edema, near-syncope, orthopnea, palpitations, paroxysmal nocturnal dyspnea, syncope and tachypnea.   Cardiovascular risk factors include advanced age (older than 53 for men, 75 for women), hypertension and male gender.  Use of agents associated with hypertension: thyroid hormones.   ------------------------------------------------------------------------  Adult hypothyroidism From 11/05/2017-no changes. Patient reports good compliance with treatment.  Lab Results  Component Value Date   TSH 0.301 (L) 01/22/2018    Anxiety From 11/05/2017-no changes. Patient reports this problem is stable. Doing well with alprazolam which he states he needs refill for.   Follow up Iron Deficiency Anemia  From 01/18/2018-labs checked. Started otc iron sulfate 325 mg bid. Referred to GI due to positive OC light. Had several adenomatous polyps removed and diverticulosis of small intestinine, had some duodenitis on EGD. He states his energy level has greatly improved since starting iron supplements.  Today patient comes in  reporting that he has been alternating taking a regular multivitamin and a multivitamin with iron every other day.  Lab Results  Component Value Date   FERRITIN 5 (L) 01/22/2018   Lab Results  Component Value Date   WBC 4.2 01/22/2018   HGB 9.1 (L) 01/22/2018   HCT 31.7 (L) 01/22/2018   MCV 73 (L) 01/22/2018   PLT 164 01/22/2018   He also reports he has been having some urinary urgency and hesitancy c/w previous episodes of prostatitis.    No Known Allergies   Current Outpatient Medications:  .  ALPRAZolam (XANAX) 0.5 MG tablet, TAKE 1/2 TO 1 TABLET BY MOUTH 3 TIMES DAILY AS NEEDED, Disp: 60 tablet, Rfl: 5 .  fluticasone (FLONASE) 50 MCG/ACT nasal spray, Place 2 sprays into both nostrils daily., Disp: 16 g, Rfl: 11 .  hydrocortisone (PROCTO-MED HC) 2.5 % rectal cream, APPLY 2 OR 3 TIMES A DAY AS NEEDED, Disp: 30 g, Rfl: 5 .  hydrocortisone 2.5 % cream, , Disp: , Rfl:  .  levothyroxine (SYNTHROID, LEVOTHROID) 150 MCG tablet, Take 1 tablet (150 mcg total) by mouth daily., Disp: 90 tablet, Rfl: 1 .  lovastatin (MEVACOR) 20 MG tablet, TAKE ONE TABLET BY MOUTH EVERY DAY, Disp: 90 tablet, Rfl: 4 .  MULTIPLE VITAMIN PO, Take 1 tablet by mouth every other day. , Disp: , Rfl:  .  Multiple Vitamins-Iron (MULTIVITAMIN/IRON PO), Take 1 tablet by mouth every other day., Disp: , Rfl:  .  omeprazole (PRILOSEC) 20 MG capsule, TAKE 1-2 CAPSULES BY MOUTH DAILY AS NEEDED FOR HEARTBURN, Disp: 60 capsule, Rfl: 12 .  Probiotic Product (PROBIOTIC PO), Take by mouth., Disp: , Rfl:  .  tamsulosin (FLOMAX) 0.4 MG CAPS capsule, Take 1 capsules (0.4 mg total) by mouth daily., Disp: 180 capsule, Rfl: 0 .  aspirin 81 MG tablet, Take 1 tablet by mouth daily., Disp: , Rfl:  .  ferrous sulfate 325 (65 FE) MG tablet, Take 325 mg by mouth daily with breakfast., Disp: , Rfl:   Review of Systems  Constitutional: Negative for appetite change, chills and fever.  Respiratory: Negative for chest tightness, shortness of  breath and wheezing.   Cardiovascular: Negative for chest pain and palpitations.  Gastrointestinal: Positive for abdominal pain (pressure of the lower abdomen). Negative for nausea and vomiting.  Musculoskeletal: Positive for back pain (lower back pain x 10 days).    Social History   Tobacco Use  . Smoking status: Never Smoker  . Smokeless tobacco: Never Used  Substance Use Topics  . Alcohol use: No   Objective:   BP (!) 173/83 (BP Location: Left Arm, Cuff Size: Large)   Pulse 71   Temp 98.4 F (36.9 C) (Oral)   Resp 16   Wt 204 lb (92.5 kg)   SpO2 96% Comment: room air  BMI 33.95 kg/m  Vitals:   06/15/18 0942 06/15/18 0947  BP: (!) 170/100 (!) 173/83  Pulse: 71   Resp: 16   Temp: 98.4 F (36.9 C)   TempSrc: Oral   SpO2: 96%   Weight: 204 lb (92.5 kg)      Physical Exam   General Appearance:    Alert, cooperative, no distress  Eyes:    PERRL, conjunctiva/corneas clear, EOM's intact       Lungs:     Clear to auscultation bilaterally, respirations unlabored  Heart:    Regular rate and rhythm  Neurologic:   Awake, alert, oriented x 3. No apparent focal neurological           defect.       Results for orders placed or performed in visit on 06/15/18  POCT Urinalysis Dipstick  Result Value Ref Range   Color, UA amber    Clarity, UA clear    Glucose, UA Negative Negative   Bilirubin, UA small    Ketones, UA negative    Spec Grav, UA 1.025 1.010 - 1.025   Blood, UA negative    pH, UA 6.0 5.0 - 8.0   Protein, UA Positive (A) Negative   Urobilinogen, UA 0.2 0.2 or 1.0 E.U./dL   Nitrite, UA negative    Leukocytes, UA Negative Negative   Appearance     Odor         Assessment & Plan:           Lelon Huh, MD  [facility]

## 2018-06-15 ENCOUNTER — Encounter: Payer: Self-pay | Admitting: Family Medicine

## 2018-06-15 ENCOUNTER — Ambulatory Visit (INDEPENDENT_AMBULATORY_CARE_PROVIDER_SITE_OTHER): Payer: Medicare Other | Admitting: Family Medicine

## 2018-06-15 VITALS — BP 173/83 | HR 71 | Temp 98.4°F | Resp 16 | Wt 204.0 lb

## 2018-06-15 DIAGNOSIS — N401 Enlarged prostate with lower urinary tract symptoms: Secondary | ICD-10-CM | POA: Diagnosis not present

## 2018-06-15 DIAGNOSIS — M545 Low back pain, unspecified: Secondary | ICD-10-CM

## 2018-06-15 DIAGNOSIS — N410 Acute prostatitis: Secondary | ICD-10-CM | POA: Diagnosis not present

## 2018-06-15 DIAGNOSIS — J301 Allergic rhinitis due to pollen: Secondary | ICD-10-CM | POA: Diagnosis not present

## 2018-06-15 DIAGNOSIS — H6981 Other specified disorders of Eustachian tube, right ear: Secondary | ICD-10-CM

## 2018-06-15 DIAGNOSIS — D500 Iron deficiency anemia secondary to blood loss (chronic): Secondary | ICD-10-CM

## 2018-06-15 DIAGNOSIS — R3911 Hesitancy of micturition: Secondary | ICD-10-CM

## 2018-06-15 DIAGNOSIS — E039 Hypothyroidism, unspecified: Secondary | ICD-10-CM

## 2018-06-15 DIAGNOSIS — F419 Anxiety disorder, unspecified: Secondary | ICD-10-CM | POA: Diagnosis not present

## 2018-06-15 LAB — POCT URINALYSIS DIPSTICK
Blood, UA: NEGATIVE
GLUCOSE UA: NEGATIVE
Ketones, UA: NEGATIVE
LEUKOCYTES UA: NEGATIVE
NITRITE UA: NEGATIVE
PROTEIN UA: POSITIVE — AB
SPEC GRAV UA: 1.025 (ref 1.010–1.025)
Urobilinogen, UA: 0.2 E.U./dL
pH, UA: 6 (ref 5.0–8.0)

## 2018-06-15 MED ORDER — TAMSULOSIN HCL 0.4 MG PO CAPS: 0.4000 mg | ORAL_CAPSULE | Freq: Every day | ORAL | 3 refills | Status: DC

## 2018-06-15 MED ORDER — HYDROCORTISONE 2.5 % EX CREA: TOPICAL_CREAM | CUTANEOUS | 3 refills | Status: DC

## 2018-06-15 MED ORDER — TOLTERODINE TARTRATE ER 2 MG PO CP24: 2.0000 mg | ORAL_CAPSULE | Freq: Every day | ORAL | 0 refills | Status: DC

## 2018-06-15 MED ORDER — FLUTICASONE PROPIONATE 50 MCG/ACT NA SUSP: 2.0000 | Freq: Every day | NASAL | 11 refills | Status: DC

## 2018-06-15 MED ORDER — CIPROFLOXACIN HCL 500 MG PO TABS: 500.0000 mg | ORAL_TABLET | Freq: Two times a day (BID) | ORAL | 0 refills | Status: AC

## 2018-06-15 MED ORDER — ALPRAZOLAM 0.5 MG PO TABS: ORAL_TABLET | ORAL | 5 refills | Status: DC

## 2018-06-15 NOTE — Patient Instructions (Addendum)
   You can stop taking daily aspirin

## 2018-06-16 ENCOUNTER — Other Ambulatory Visit: Payer: Self-pay | Admitting: *Deleted

## 2018-06-16 DIAGNOSIS — E039 Hypothyroidism, unspecified: Secondary | ICD-10-CM

## 2018-06-16 LAB — CBC
Hematocrit: 39.6 % (ref 37.5–51.0)
Hemoglobin: 12.9 g/dL — ABNORMAL LOW (ref 13.0–17.7)
MCH: 26.2 pg — ABNORMAL LOW (ref 26.6–33.0)
MCHC: 32.6 g/dL (ref 31.5–35.7)
MCV: 81 fL (ref 79–97)
PLATELETS: 153 10*3/uL (ref 150–450)
RBC: 4.92 x10E6/uL (ref 4.14–5.80)
RDW: 17.5 % — AB (ref 12.3–15.4)
WBC: 5.1 10*3/uL (ref 3.4–10.8)

## 2018-06-16 LAB — TSH: TSH: 0.334 u[IU]/mL — AB (ref 0.450–4.500)

## 2018-06-16 LAB — FERRITIN: Ferritin: 8 ng/mL — ABNORMAL LOW (ref 30–400)

## 2018-06-16 MED ORDER — LEVOTHYROXINE SODIUM 150 MCG PO TABS: 150.0000 ug | ORAL_TABLET | Freq: Every day | ORAL | 3 refills | Status: DC

## 2018-09-13 DIAGNOSIS — L400 Psoriasis vulgaris: Secondary | ICD-10-CM | POA: Diagnosis not present

## 2018-09-13 DIAGNOSIS — D2272 Melanocytic nevi of left lower limb, including hip: Secondary | ICD-10-CM | POA: Diagnosis not present

## 2018-09-13 DIAGNOSIS — Z08 Encounter for follow-up examination after completed treatment for malignant neoplasm: Secondary | ICD-10-CM | POA: Diagnosis not present

## 2018-09-13 DIAGNOSIS — D2261 Melanocytic nevi of right upper limb, including shoulder: Secondary | ICD-10-CM | POA: Diagnosis not present

## 2018-09-13 DIAGNOSIS — Z85828 Personal history of other malignant neoplasm of skin: Secondary | ICD-10-CM | POA: Diagnosis not present

## 2018-09-13 DIAGNOSIS — X32XXXA Exposure to sunlight, initial encounter: Secondary | ICD-10-CM | POA: Diagnosis not present

## 2018-09-13 DIAGNOSIS — L570 Actinic keratosis: Secondary | ICD-10-CM | POA: Diagnosis not present

## 2018-09-13 DIAGNOSIS — D2262 Melanocytic nevi of left upper limb, including shoulder: Secondary | ICD-10-CM | POA: Diagnosis not present

## 2018-09-13 DIAGNOSIS — Z85820 Personal history of malignant melanoma of skin: Secondary | ICD-10-CM | POA: Diagnosis not present

## 2018-11-05 ENCOUNTER — Ambulatory Visit (INDEPENDENT_AMBULATORY_CARE_PROVIDER_SITE_OTHER): Payer: Medicare Other

## 2018-11-05 VITALS — BP 172/92 | HR 74 | Temp 98.4°F | Ht 65.0 in | Wt 206.6 lb

## 2018-11-05 DIAGNOSIS — Z0000 Encounter for general adult medical examination without abnormal findings: Secondary | ICD-10-CM

## 2018-11-05 NOTE — Patient Instructions (Addendum)
Christian Mora , Thank you for taking time to come for your Medicare Wellness Visit. I appreciate your ongoing commitment to your health goals. Please review the following plan we discussed and let me know if I can assist you in the future.   Screening recommendations/referrals: Colonoscopy: Up to date, due 03/2021 Recommended yearly ophthalmology/optometry visit for glaucoma screening and checkup Recommended yearly dental visit for hygiene and checkup  Vaccinations: Influenza vaccine: Up to date Pneumococcal vaccine: Completed series Tdap vaccine: Up to date, due 11/2027 Shingles vaccine: Completed series    Advanced directives: Currently on file.   Conditions/risks identified: Obesity- recommend to exercise for 3 days a week for at least 30 minutes at a time.   Next appointment: 11/08/18 @ 10:00 AM with Dr Caryn Section.   Preventive Care 73 Years and Older, Male Preventive care refers to lifestyle choices and visits with your health care provider that can promote health and wellness. What does preventive care include?  A yearly physical exam. This is also called an annual well check.  Dental exams once or twice a year.  Routine eye exams. Ask your health care provider how often you should have your eyes checked.  Personal lifestyle choices, including:  Daily care of your teeth and gums.  Regular physical activity.  Eating a healthy diet.  Avoiding tobacco and drug use.  Limiting alcohol use.  Practicing safe sex.  Taking low doses of aspirin every day.  Taking vitamin and mineral supplements as recommended by your health care provider. What happens during an annual well check? The services and screenings done by your health care provider during your annual well check will depend on your age, overall health, lifestyle risk factors, and family history of disease. Counseling  Your health care provider may ask you questions about your:  Alcohol use.  Tobacco use.  Drug  use.  Emotional well-being.  Home and relationship well-being.  Sexual activity.  Eating habits.  History of falls.  Memory and ability to understand (cognition).  Work and work Statistician. Screening  You may have the following tests or measurements:  Height, weight, and BMI.  Blood pressure.  Lipid and cholesterol levels. These may be checked every 5 years, or more frequently if you are over 32 years old.  Skin check.  Lung cancer screening. You may have this screening every year starting at age 84 if you have a 30-pack-year history of smoking and currently smoke or have quit within the past 15 years.  Fecal occult blood test (FOBT) of the stool. You may have this test every year starting at age 94.  Flexible sigmoidoscopy or colonoscopy. You may have a sigmoidoscopy every 5 years or a colonoscopy every 10 years starting at age 62.  Prostate cancer screening. Recommendations will vary depending on your family history and other risks.  Hepatitis C blood test.  Hepatitis B blood test.  Sexually transmitted disease (STD) testing.  Diabetes screening. This is done by checking your blood sugar (glucose) after you have not eaten for a while (fasting). You may have this done every 1-3 years.  Abdominal aortic aneurysm (AAA) screening. You may need this if you are a current or former smoker.  Osteoporosis. You may be screened starting at age 34 if you are at high risk. Talk with your health care provider about your test results, treatment options, and if necessary, the need for more tests. Vaccines  Your health care provider may recommend certain vaccines, such as:  Influenza vaccine. This is  recommended every year.  Tetanus, diphtheria, and acellular pertussis (Tdap, Td) vaccine. You may need a Td booster every 10 years.  Zoster vaccine. You may need this after age 38.  Pneumococcal 13-valent conjugate (PCV13) vaccine. One dose is recommended after age  62.  Pneumococcal polysaccharide (PPSV23) vaccine. One dose is recommended after age 50. Talk to your health care provider about which screenings and vaccines you need and how often you need them. This information is not intended to replace advice given to you by your health care provider. Make sure you discuss any questions you have with your health care provider. Document Released: 11/16/2015 Document Revised: 07/09/2016 Document Reviewed: 08/21/2015 Elsevier Interactive Patient Education  2017 [place] Prevention in the Home Falls can cause injuries. They can happen to people of all ages. There are many things you can do to make your home safe and to help prevent falls. What can I do on the outside of my home?  Regularly fix the edges of walkways and driveways and fix any cracks.  Remove anything that might make you trip as you walk through a door, such as a raised step or threshold.  Trim any bushes or trees on the path to your home.  Use bright outdoor lighting.  Clear any walking paths of anything that might make someone trip, such as rocks or tools.  Regularly check to see if handrails are loose or broken. Make sure that both sides of any steps have handrails.  Any raised decks and porches should have guardrails on the edges.  Have any leaves, snow, or ice cleared regularly.  Use sand or salt on walking paths during winter.  Clean up any spills in your garage right away. This includes oil or grease spills. What can I do in the bathroom?  Use night lights.  Install grab bars by the toilet and in the tub and shower. Do not use towel bars as grab bars.  Use non-skid mats or decals in the tub or shower.  If you need to sit down in the shower, use a plastic, non-slip stool.  Keep the floor dry. Clean up any water that spills on the floor as soon as it happens.  Remove soap buildup in the tub or shower regularly.  Attach bath mats securely with double-sided  non-slip rug tape.  Do not have throw rugs and other things on the floor that can make you trip. What can I do in the bedroom?  Use night lights.  Make sure that you have a light by your bed that is easy to reach.  Do not use any sheets or blankets that are too big for your bed. They should not hang down onto the floor.  Have a firm chair that has side arms. You can use this for support while you get dressed.  Do not have throw rugs and other things on the floor that can make you trip. What can I do in the kitchen?  Clean up any spills right away.  Avoid walking on wet floors.  Keep items that you use a lot in easy-to-reach places.  If you need to reach something above you, use a strong step stool that has a grab bar.  Keep electrical cords out of the way.  Do not use floor polish or wax that makes floors slippery. If you must use wax, use non-skid floor wax.  Do not have throw rugs and other things on the floor that can make you trip.  What can I do with my stairs?  Do not leave any items on the stairs.  Make sure that there are handrails on both sides of the stairs and use them. Fix handrails that are broken or loose. Make sure that handrails are as long as the stairways.  Check any carpeting to make sure that it is firmly attached to the stairs. Fix any carpet that is loose or worn.  Avoid having throw rugs at the top or bottom of the stairs. If you do have throw rugs, attach them to the floor with carpet tape.  Make sure that you have a light switch at the top of the stairs and the bottom of the stairs. If you do not have them, ask someone to add them for you. What else can I do to help prevent falls?  Wear shoes that:  Do not have high heels.  Have rubber bottoms.  Are comfortable and fit you well.  Are closed at the toe. Do not wear sandals.  If you use a stepladder:  Make sure that it is fully opened. Do not climb a closed stepladder.  Make sure that both  sides of the stepladder are locked into place.  Ask someone to hold it for you, if possible.  Clearly mark and make sure that you can see:  Any grab bars or handrails.  First and last steps.  Where the edge of each step is.  Use tools that help you move around (mobility aids) if they are needed. These include:  Canes.  Walkers.  Scooters.  Crutches.  Turn on the lights when you go into a dark area. Replace any light bulbs as soon as they burn out.  Set up your furniture so you have a clear path. Avoid moving your furniture around.  If any of your floors are uneven, fix them.  If there are any pets around you, be aware of where they are.  Review your medicines with your doctor. Some medicines can make you feel dizzy. This can increase your chance of falling. Ask your doctor what other things that you can do to help prevent falls. This information is not intended to replace advice given to you by your health care provider. Make sure you discuss any questions you have with your health care provider. Document Released: 08/16/2009 Document Revised: 03/27/2016 Document Reviewed: 11/24/2014 Elsevier Interactive Patient Education  2017 [employer].

## 2018-11-05 NOTE — Progress Notes (Signed)
Subjective:   Christian Mora is a 73 y.o. male who presents for Medicare Annual/Subsequent preventive examination.  Review of Systems:  N/A  Cardiac Risk Factors include: advanced age (>86men, >87 women);dyslipidemia;hypertension;male gender;obesity (BMI >30kg/m2)     Objective:    Vitals: BP (!) 172/92 (BP Location: Left Arm)   Pulse 74   Temp 98.4 F (36.9 C) (Oral)   Ht 5\' 5"  (1.651 m)   Wt 206 lb 9.6 oz (93.7 kg)   BMI 34.38 kg/m   Body mass index is 34.38 kg/m.  Advanced Directives 11/05/2018 03/03/2018 10/16/2015 07/30/2015  Does Patient Have a Medical Advance Directive? Yes Yes Yes Yes  Type of Paramedic of [place];Living will [place];Living will Living will [place];Living will  Copy of [place] in Chart? Yes - validated most recent copy scanned in chart (See row information) No - copy requested - -    Tobacco Social History   Tobacco Use  Smoking Status Never Smoker  Smokeless Tobacco Never Used     Counseling given: Not Answered   Clinical Intake:  Pre-visit preparation completed: Yes  Pain : 0-10 Pain Score: 1  Pain Type: Acute pain Pain Location: Knee Pain Orientation: Right(inner) Pain Descriptors / Indicators: Sore Pain Onset: More than a month ago Pain Frequency: Constant     Nutritional Status: BMI > 30  Obese Nutritional Risks: None Diabetes: No  How often do you need to have someone help you when you read instructions, pamphlets, or other written materials from your doctor or pharmacy?: 1 - Never  Interpreter Needed?: No  Information entered by :: [facility], LPN  Past Medical History:  Diagnosis Date  . Allergy   . Anemia   . Anxiety   . GERD (gastroesophageal reflux disease)   . Hyperlipidemia   . Hypertension   . Hypothyroidism   . Thyroid disease    Past Surgical History:  Procedure Laterality Date  . COLONOSCOPY WITH PROPOFOL N/A  03/03/2018   Procedure: COLONOSCOPY WITH PROPOFOL;  Surgeon: Jonathon Bellows, MD;  Location: [facility] ENDOSCOPY;  Service: Gastroenterology;  Laterality: N/A;  . ESOPHAGOGASTRODUODENOSCOPY (EGD) WITH PROPOFOL N/A 03/03/2018   Procedure: ESOPHAGOGASTRODUODENOSCOPY (EGD) WITH PROPOFOL;  Surgeon: Jonathon Bellows, MD;  Location: [facility] ENDOSCOPY;  Service: Gastroenterology;  Laterality: N/A;  . GIVENS CAPSULE STUDY N/A 03/12/2018   Procedure: GIVENS CAPSULE STUDY;  Surgeon: Jonathon Bellows, MD;  Location: [facility] ENDOSCOPY;  Service: Gastroenterology;  Laterality: N/A;  . lymph node removed  2004  . TONSILLECTOMY AND ADENOIDECTOMY  1954   Family History  Problem Relation Age of Onset  . Hypertension Mother   . Cancer Father        prostate  . Diabetes Son    Social History   Socioeconomic History  . Marital status: Married    Spouse name: Not on file  . Number of children: 2  . Years of education: Not on file  . Highest education level: Bachelor's degree (e.g., BA, AB, BS)  Occupational History  . Occupation: retired  Scientific laboratory technician  . Financial resource strain: Not hard at all  . Food insecurity:    Worry: Never true    Inability: Never true  . Transportation needs:    Medical: No    Non-medical: No  Tobacco Use  . Smoking status: Never Smoker  . Smokeless tobacco: Never Used  Substance and Sexual Activity  . Alcohol use: No  . Drug use: No  .  Sexual activity: Yes  Lifestyle  . Physical activity:    Days per week: 0 days    Minutes per session: 0 min  . Stress: Very much  Relationships  . Social connections:    Talks on phone: Patient refused    Gets together: Patient refused    Attends religious service: Patient refused    Active member of club or organization: Patient refused    Attends meetings of clubs or organizations: Patient refused    Relationship status: Patient refused  Other Topics Concern  . Not on file  Social History Narrative  . Not on file    Outpatient Encounter  Medications as of 11/05/2018  Medication Sig  . ALPRAZolam (XANAX) 0.5 MG tablet TAKE 1/2 TO 1 TABLET BY MOUTH 3 TIMES DAILY AS NEEDED  . fluticasone (FLONASE) 50 MCG/ACT nasal spray Place 2 sprays into both nostrils daily. (Patient taking differently: Place 2 sprays into both nostrils daily. As needed)  . hydrocortisone (PROCTO-MED HC) 2.5 % rectal cream APPLY 2 OR 3 TIMES A DAY AS NEEDED  . ibuprofen (ADVIL,MOTRIN) 200 MG tablet Take 200 mg by mouth every 6 (six) hours as needed.  Marland Kitchen levothyroxine (SYNTHROID, LEVOTHROID) 150 MCG tablet Take 1 tablet (150 mcg total) by mouth daily.  Marland Kitchen lovastatin (MEVACOR) 20 MG tablet TAKE ONE TABLET BY MOUTH EVERY DAY  . Menthol-Methyl Salicylate (MUSCLE RUB) 10-15 % CREA Apply 1 application topically as needed for muscle pain.  . Multiple Vitamins-Iron (MULTIVITAMIN/IRON PO) Take 1 tablet by mouth every other day.  Marland Kitchen omeprazole (PRILOSEC) 20 MG capsule TAKE 1-2 CAPSULES BY MOUTH DAILY AS NEEDED FOR HEARTBURN (Patient taking differently: Take 20 mg by mouth 2 (two) times daily before a meal. )  . Probiotic Product (PROBIOTIC PO) Take by mouth daily.   . tamsulosin (FLOMAX) 0.4 MG CAPS capsule Take 1 capsule (0.4 mg total) by mouth daily.  . ferrous sulfate 325 (65 FE) MG tablet Take 325 mg by mouth daily with breakfast.  . hydrocortisone 2.5 % cream apply to affected area two to three times a day as needed (Patient not taking: Reported on 11/05/2018)  . MULTIPLE VITAMIN PO Take 1 tablet by mouth every other day.   . naproxen (NAPROSYN) 500 MG tablet TAKE ONE TABLET TWICE DAILY WITH A MEAL (Patient not taking: Reported on 06/15/2018)   No facility-administered encounter medications on file as of 11/05/2018.     Activities of Daily Living In your present state of health, do you have any difficulty performing the following activities: 11/05/2018 11/05/2017  Hearing? Tempie Donning  Comment Wears bilateral hearing aids.  -  Vision? N N  Comment Wears eye glasses.  -  Difficulty  concentrating or making decisions? N N  Walking or climbing stairs? N N  Dressing or bathing? N N  Doing errands, shopping? N N  Preparing Food and eating ? N -  Using the Toilet? N -  In the past six months, have you accidently leaked urine? Y -  Comment Due to enlarged prostate. On Flomax.  -  Do you have problems with loss of bowel control? N -  Managing your Medications? N -  Managing your Finances? N -  Housekeeping or managing your Housekeeping? N -  Some recent data might be hidden    Patient Care Team: Birdie Sons, MD as PCP - General (Family Medicine) Dasher, Rayvon Char, MD (Dermatology) Birder Robson, MD as Referring Physician (Ophthalmology) Kipp Laurence, MD as Referring Physician (Audiology)  Assessment:   This is a routine wellness examination for Nikita.  Exercise Activities and Dietary recommendations Current Exercise Habits: The patient does not participate in regular exercise at present, Exercise limited by: None identified  Goals    . Exercise 150 minutes per week (moderate activity)    . Exercise 3x per week (30 min per time)     Recommend to exercise for 3 days a week for at least 30 minutes at a time.         Fall Risk Fall Risk  11/05/2018 11/05/2017 07/30/2015  Falls in the past year? 0 No No  Number falls in past yr: 0 - -  Injury with Fall? 0 - -   FALL RISK PREVENTION PERTAINING TO THE HOME:  Any stairs in or around the home WITH handrails? No  Home free of loose throw rugs in walkways, pet beds, electrical cords, etc? Yes  Adequate lighting in your home to reduce risk of falls? Yes   ASSISTIVE DEVICES UTILIZED TO PREVENT FALLS:  Life alert? No  Use of a cane, walker or w/c? No  Grab bars in the bathroom? No  Shower chair or bench in shower? No  Elevated toilet seat or a handicapped toilet? No    TIMED UP AND GO:  Was the test performed? No .     Depression Screen PHQ 2/9 Scores 11/05/2018 11/05/2017 07/30/2015  PHQ - 2 Score  0 0 0    Cognitive Function     6CIT Screen 11/05/2018  What Year? 0 points  What month? 0 points  What time? 0 points  Count back from 20 0 points  Months in reverse 0 points  Repeat phrase 0 points  Total Score 0    Immunization History  Administered Date(s) Administered  . Influenza Split 10/06/2007, 08/10/2009, 08/03/2010, 08/13/2011, 08/13/2012  . Influenza, High Dose Seasonal PF 09/14/2014, 07/30/2015, 08/12/2016, 07/23/2018  . Influenza,inj,Quad PF,6+ Mos 07/20/2013  . Influenza-Unspecified 08/10/2017  . Pneumococcal Conjugate-13 09/14/2014  . Pneumococcal Polysaccharide-23 08/13/2012  . Tdap 11/10/2017  . Zoster 08/29/2013  . Zoster Recombinat (Shingrix) 01/27/2018, 04/22/2018    Qualifies for Shingles Vaccine? Up to date  Tdap: Up to date  Flu Vaccine: Up to date  Pneumococcal Vaccine: Up to date   Screening Tests Health Maintenance  Topic Date Due  . COLONOSCOPY  03/03/2021  . TETANUS/TDAP  11/11/2027  . INFLUENZA VACCINE  Completed  . Hepatitis C Screening  Completed  . PNA vac Low Risk Adult  Completed   Cancer Screenings:  Colorectal Screening: Completed 03/03/18. Repeat every 3 years.  Lung Cancer Screening: (Low Dose CT Chest recommended if Age 49-80 years, 30 pack-year currently smoking OR have quit w/in 15years.) does not qualify.    Additional Screening:  Hepatitis C Screening: Up to date  Vision Screening: Recommended annual ophthalmology exams for early detection of glaucoma and other disorders of the eye.  Dental Screening: Recommended annual dental exams for proper oral hygiene  Community Resource Referral:  CRR required this visit?  No        Plan:  I have personally reviewed and addressed the Medicare Annual Wellness questionnaire and have noted the following in the patient's chart:  A. Medical and social history B. Use of alcohol, tobacco or illicit drugs  C. Current medications and supplements D. Functional ability and  status E.  Nutritional status F.  Physical activity G. Advance directives H. List of other physicians I.  Hospitalizations, surgeries, and ER visits in previous  12 months J.  [place] such as hearing and vision if needed, cognitive and depression L. Referrals and appointments - none  In addition, I have reviewed and discussed with patient certain preventive protocols, quality metrics, and best practice recommendations. A written personalized care plan for preventive services as well as general preventive health recommendations were provided to patient.  See attached scanned questionnaire for additional information.   Signed,  Fabio Neighbors, LPN Nurse Health Advisor   Nurse Recommendations: Please follow up on BP readings. Pt to check his BP at home twice daily and record readings. Pt to bring readings to CPE on 11/08/18. Declined any s/s today. Also please f/u on right knee pain.

## 2018-11-08 ENCOUNTER — Ambulatory Visit (INDEPENDENT_AMBULATORY_CARE_PROVIDER_SITE_OTHER): Payer: Medicare Other | Admitting: Family Medicine

## 2018-11-08 ENCOUNTER — Encounter: Payer: Self-pay | Admitting: Family Medicine

## 2018-11-08 VITALS — BP 186/92 | HR 79 | Temp 97.9°F | Resp 16 | Ht 65.0 in | Wt 204.0 lb

## 2018-11-08 DIAGNOSIS — E785 Hyperlipidemia, unspecified: Secondary | ICD-10-CM

## 2018-11-08 DIAGNOSIS — E039 Hypothyroidism, unspecified: Secondary | ICD-10-CM | POA: Diagnosis not present

## 2018-11-08 DIAGNOSIS — D509 Iron deficiency anemia, unspecified: Secondary | ICD-10-CM | POA: Diagnosis not present

## 2018-11-08 DIAGNOSIS — I10 Essential (primary) hypertension: Secondary | ICD-10-CM | POA: Diagnosis not present

## 2018-11-08 DIAGNOSIS — S83411A Sprain of medial collateral ligament of right knee, initial encounter: Secondary | ICD-10-CM

## 2018-11-08 DIAGNOSIS — K219 Gastro-esophageal reflux disease without esophagitis: Secondary | ICD-10-CM

## 2018-11-08 MED ORDER — OMEPRAZOLE 20 MG PO CPDR: 20.0000 mg | DELAYED_RELEASE_CAPSULE | Freq: Two times a day (BID) | ORAL | 12 refills | Status: DC

## 2018-11-08 MED ORDER — AMLODIPINE BESYLATE 2.5 MG PO TABS: 2.5000 mg | ORAL_TABLET | Freq: Every day | ORAL | 1 refills | Status: DC

## 2018-11-08 MED ORDER — DICLOFENAC SODIUM 1 % TD GEL: 4.0000 g | Freq: Four times a day (QID) | TRANSDERMAL | 1 refills | Status: AC | PRN

## 2018-11-08 NOTE — Progress Notes (Signed)
[name] Male    DOB: [birth year]   73 y.o.   MRN: [id number] Visit Date: 11/08/2018  Today's Provider: Lelon Huh, MD   Chief Complaint  Patient presents with  . Hypothyroidism  . Anemia  . Hypertension  . Anxiety   Subjective:     HPI Hypothyroidism: Patient was last seen for this problem 4 months ago and no changes were made. Last tsh in August was 0.334. He was changed from 117mcg to 156mcg daily in September 2018  Anemia: Patient was last seen for this problem 4 months ago and no changes were made. Patient is currently taking a daily multivitamin with iron.    Hypertension, follow-up:  BP Readings from Last 3 Encounters:  11/08/18 (!) 186/92  11/05/18 (!) 172/92  06/15/18 (!) 173/83     He was last seen for hypertension 1 years ago.  BP at that visit was 140/80. Management since that visit includes no changes. Patient is not currently on any anti-hypertensive medications. He is not having side effects.  He is not exercising. He is not adherent to low salt diet.   Outside blood pressures are checked at home and average 150/90. He is experiencing none.  Patient denies chest pain, chest pressure/discomfort, claudication, dyspnea, exertional chest pressure/discomfort, fatigue, irregular heart beat, lower extremity edema, near-syncope, orthopnea, palpitations, paroxysmal nocturnal dyspnea, syncope and tachypnea.   Cardiovascular risk factors include advanced age (older than 80 for men, 41 for women), hypertension and male gender.  Use of agents associated with hypertension: thyroid hormones.     Weight trend: stable Wt Readings from Last 3 Encounters:  11/08/18 204 lb (92.5 kg)  11/05/18 206 lb 9.6 oz (93.7 kg)  06/15/18 204 lb (92.5 kg)    Current diet: in general, an "unhealthy" diet  ------------------------------------------------------------------------ Anxiety: Patient was last seen for this problem 4 months ago and no changes  were made. Patient reports that he has been more anxious lately due to concerns for his blood pressure and the declining health of his mother.    Lipid/Cholesterol, Follow-up:    . Last Lipid Panel:    Component Value Date/Time   CHOL 146 11/08/2018 1103   TRIG 189 (H) 11/08/2018 1103   HDL 33 (L) 11/08/2018 1103   CHOLHDL 4.4 11/08/2018 1103   CHOLHDL 3.6 07/30/2017 0758   LDLCALC 75 11/08/2018 1103   LDLCALC 79 07/30/2017 0758     He reports good compliance with treatment. He is not having side effects.  He would like to stop statin if able since his cholesterol has been well controlled for several years, although is not having any adverse reaction.   Wt Readings from Last 3 Encounters:  11/08/18 204 lb (92.5 kg)  11/05/18 206 lb 9.6 oz (93.7 kg)  06/15/18 204 lb (92.5 kg)    -------------------------------------------------------------------  He also complains of pain on the inside of his left knee for the last several week which waxes and wanes. Has tried OTC analgesic creams with some relief. No oral medications. No known injuries.   No Known Allergies   Current Outpatient Medications:  .  ALPRAZolam (XANAX) 0.5 MG tablet, TAKE 1/2 TO 1 TABLET BY MOUTH 3 TIMES DAILY AS NEEDED, Disp: 60 tablet, Rfl: 5 .  ferrous sulfate 325 (65 FE) MG tablet, Take 325 mg by mouth daily with breakfast., Disp: , Rfl:  .  fluticasone (FLONASE) 50 MCG/ACT nasal spray, Place 2 sprays into both nostrils daily. (Patient taking  differently: Place 2 sprays into both nostrils daily. As needed), Disp: 16 g, Rfl: 11 .  hydrocortisone (PROCTO-MED HC) 2.5 % rectal cream, APPLY 2 OR 3 TIMES A DAY AS NEEDED, Disp: 30 g, Rfl: 5 .  hydrocortisone 2.5 % cream, apply to affected area two to three times a day as needed, Disp: 30 g, Rfl: 3 .  ibuprofen (ADVIL,MOTRIN) 200 MG tablet, Take 200 mg by mouth every 6 (six) hours as needed., Disp: , Rfl:  .  levothyroxine (SYNTHROID, LEVOTHROID) 150 MCG tablet,  Take 1 tablet (150 mcg total) by mouth daily., Disp: 90 tablet, Rfl: 3 .  lovastatin (MEVACOR) 20 MG tablet, TAKE ONE TABLET BY MOUTH EVERY DAY, Disp: 90 tablet, Rfl: 4 .  Menthol-Methyl Salicylate (MUSCLE RUB) 10-15 % CREA, Apply 1 application topically as needed for muscle pain., Disp: , Rfl:  .  MULTIPLE VITAMIN PO, Take 1 tablet by mouth every other day. , Disp: , Rfl:  .  Multiple Vitamins-Iron (MULTIVITAMIN/IRON PO), Take 1 tablet by mouth every other day., Disp: , Rfl:  .  naproxen (NAPROSYN) 500 MG tablet, TAKE ONE TABLET TWICE DAILY WITH A MEAL, Disp: 30 tablet, Rfl: 5 .  omeprazole (PRILOSEC) 20 MG capsule, TAKE 1-2 CAPSULES BY MOUTH DAILY AS NEEDED FOR HEARTBURN (Patient taking differently: Take 20 mg by mouth 2 (two) times daily before a meal. ), Disp: 60 capsule, Rfl: 12 .  Probiotic Product (PROBIOTIC PO), Take by mouth daily. , Disp: , Rfl:  .  tamsulosin (FLOMAX) 0.4 MG CAPS capsule, Take 1 capsule (0.4 mg total) by mouth daily., Disp: 90 capsule, Rfl: 3  Review of Systems  Constitutional: Negative for appetite change, chills and fever.  Respiratory: Negative for chest tightness, shortness of breath and wheezing.   Cardiovascular: Negative for chest pain and palpitations.  Gastrointestinal: Negative for abdominal pain, nausea and vomiting.    Social History   Tobacco Use  . Smoking status: Never Smoker  . Smokeless tobacco: Never Used  Substance Use Topics  . Alcohol use: No      Objective:   BP (!) 186/92 (BP Location: Left Arm, Patient Position: Sitting, Cuff Size: Large)   Pulse 79   Temp 97.9 F (36.6 C) (Oral)   Resp 16   Ht 5\' 5"  (1.651 m)   Wt 204 lb (92.5 kg)   SpO2 96% Comment: room air  BMI 33.95 kg/m  Vitals:   11/08/18 1003  BP: (!) 186/92  Pulse: 79  Resp: 16  Temp: 97.9 F (36.6 C)  TempSrc: Oral  SpO2: 96%  Weight: 204 lb (92.5 kg)  Height: 5\' 5"  (1.651 m)     Physical Exam   General Appearance:    Alert, cooperative, no distress    Eyes:    PERRL, conjunctiva/corneas clear, EOM's intact       Lungs:     Clear to auscultation bilaterally, respirations unlabored  Heart:    Regular rate and rhythm  Neurologic:   Awake, alert, oriented x 3. No apparent focal neurological           defect.   MS:   Tender and slightly swollen along length of right MCL. Stable joint. Negative Mcmurray's sign.       Assessment & Plan    1. Essential (primary) hypertension Likely aggravated by stress or anxiety,  Start amLODipine (NORVASC) 2.5 MG tablet; Take 1 tablet (2.5 mg total) by mouth daily.  Dispense: 30 tablet; Refill: 1  2. Gastroesophageal reflux disease without  esophagitis Well controlled.  Continue current medications.  refill- omeprazole (PRILOSEC) 20 MG capsule; Take 1 capsule (20 mg total) by mouth 2 (two) times daily before a meal.  Dispense: 60 capsule; Refill: 12  3. Hyperlipidemia, unspecified hyperlipidemia type He is tolerating lovastatin well with no adverse effects.  He would like to stop medication, which would be reasonably if cholesterol remains very well controlled.  - Comprehensive metabolic panel - Lipid panel  4. Iron deficiency anemia, unspecified iron deficiency anemia type  - CBC - Ferritin  5. Adult hypothyroidism  - TSH  6. Sprain of medial collateral ligament of right knee, initial encounter      Lelon Huh, MD  [place]

## 2018-11-08 NOTE — Patient Instructions (Signed)
.   Please bring all of your medications to every appointment so we can make sure that our medication list is the same as yours.   

## 2018-11-09 ENCOUNTER — Telehealth: Payer: Self-pay | Admitting: Family Medicine

## 2018-11-09 LAB — COMPREHENSIVE METABOLIC PANEL
A/G RATIO: 1.3 (ref 1.2–2.2)
ALBUMIN: 4.3 g/dL (ref 3.5–4.8)
ALK PHOS: 71 IU/L (ref 39–117)
ALT: 17 IU/L (ref 0–44)
AST: 19 IU/L (ref 0–40)
BILIRUBIN TOTAL: 0.7 mg/dL (ref 0.0–1.2)
BUN / CREAT RATIO: 10 (ref 10–24)
BUN: 10 mg/dL (ref 8–27)
CO2: 24 mmol/L (ref 20–29)
CREATININE: 1.03 mg/dL (ref 0.76–1.27)
Calcium: 9.3 mg/dL (ref 8.6–10.2)
Chloride: 100 mmol/L (ref 96–106)
GFR, EST AFRICAN AMERICAN: 84 mL/min/{1.73_m2} (ref >59)
GFR, EST NON AFRICAN AMERICAN: 72 mL/min/{1.73_m2} (ref >59)
GLOBULIN, TOTAL: 3.2 g/dL (ref 1.5–4.5)
Glucose: 99 mg/dL (ref 65–99)
Potassium: 3.9 mmol/L (ref 3.5–5.2)
SODIUM: 138 mmol/L (ref 134–144)
Total Protein: 7.5 g/dL (ref 6.0–8.5)

## 2018-11-09 LAB — LIPID PANEL
CHOL/HDL RATIO: 4.4 ratio (ref 0.0–5.0)
Cholesterol, Total: 146 mg/dL (ref 100–199)
HDL: 33 mg/dL — ABNORMAL LOW (ref >39)
LDL CALC: 75 mg/dL (ref 0–99)
Triglycerides: 189 mg/dL — ABNORMAL HIGH (ref 0–149)
VLDL Cholesterol Cal: 38 mg/dL (ref 5–40)

## 2018-11-09 LAB — CBC
HEMOGLOBIN: 14 g/dL (ref 13.0–17.7)
Hematocrit: 41.6 % (ref 37.5–51.0)
MCH: 28 pg (ref 26.6–33.0)
MCHC: 33.7 g/dL (ref 31.5–35.7)
MCV: 83 fL (ref 79–97)
PLATELETS: 192 10*3/uL (ref 150–450)
RBC: 5 x10E6/uL (ref 4.14–5.80)
RDW: 14.6 % (ref 11.6–15.4)
WBC: 4.8 10*3/uL (ref 3.4–10.8)

## 2018-11-09 LAB — TSH: TSH: 0.289 u[IU]/mL — AB (ref 0.450–4.500)

## 2018-11-09 LAB — FERRITIN: Ferritin: 8 ng/mL — ABNORMAL LOW (ref 30–400)

## 2018-11-09 MED ORDER — LEVOTHYROXINE SODIUM 137 MCG PO TABS: 137.0000 ug | ORAL_TABLET | Freq: Every day | ORAL | 1 refills | Status: DC

## 2018-11-09 NOTE — Telephone Encounter (Signed)
Pt advised.  RX sent to [facility].  Thanks,   -Nakima Fluegge  

## 2018-11-09 NOTE — Telephone Encounter (Signed)
Needing a copy of printed labs.  Please let pt know when he can come by to pick them up.  Thanks, [employer]

## 2018-11-09 NOTE — Telephone Encounter (Signed)
Notes recorded by Birdie Sons, MD on 11/09/2018 at 7:51 AM EST Iron level is low, but he is not anemic. Cholesterol, kidney functions and liver functions all normal. Is getting hyPERthyroid, need to reduce levothyroxine to 171mcg daily, #90. rf x 1 Follow up for BP check in February as scheduled.

## 2018-11-09 NOTE — Telephone Encounter (Signed)
-----   Message from Birdie Sons, MD sent at 11/09/2018  8:18 AM EST ----- Also, we discussed stopping cholesterol medications at his o.v. his cholesterol is still very well controlled, so if he would to stop lovastatin for awhile he could, we can recheck cholesterol next we check his thyroid to make sure it doesn't go up to much.

## 2018-12-07 ENCOUNTER — Ambulatory Visit (INDEPENDENT_AMBULATORY_CARE_PROVIDER_SITE_OTHER): Payer: Medicare Other | Admitting: Family Medicine

## 2018-12-07 ENCOUNTER — Encounter: Payer: Self-pay | Admitting: Family Medicine

## 2018-12-07 DIAGNOSIS — I10 Essential (primary) hypertension: Secondary | ICD-10-CM

## 2018-12-07 MED ORDER — AMLODIPINE BESYLATE 5 MG PO TABS: 5.0000 mg | ORAL_TABLET | Freq: Every day | ORAL | 3 refills | Status: DC

## 2018-12-07 NOTE — Patient Instructions (Signed)
.   Please review the attached list of medications and notify my office if there are any errors.   . Please bring all of your medications to every appointment so we can make sure that our medication list is the same as yours.   

## 2018-12-07 NOTE — Progress Notes (Signed)
[name] Male    DOB: 1946/02/25   73 y.o.   MRN: [id number] Visit Date: 12/07/2018  Today's Provider: Lelon Huh, MD   Chief Complaint  Patient presents with  . Hypothyroidism  . Hypertension  . Hyperlipidemia   Subjective:     HPI  Follow up for Hypothyroid:  The patient was last seen for this 4 weeks ago. Changes made at last visit include reducing Levothyroxine to 127mcg daily.  He reports good compliance with treatment. He feels that condition is stable. He is not having side effects.   ------------------------------------------------------------------------------------  Hypertension, follow-up:  BP Readings from Last 3 Encounters:  12/07/18 (!) 158/82  11/08/18 (!) 186/92  11/05/18 (!) 172/92    He was last seen for hypertension 4 weeks ago.  BP at that visit was 186/92. Management since that visit includes started Amlodipine 2.5mg  daily. He reports good compliance with treatment. He is not having side effects.  He is not exercising. He is not adherent to low salt diet.   Outside blood pressures are mostly in the 130s/70s He is experiencing none.  Patient denies chest pain, chest pressure/discomfort, claudication, dyspnea, exertional chest pressure/discomfort, fatigue, irregular heart beat, lower extremity edema, near-syncope, orthopnea, palpitations, paroxysmal nocturnal dyspnea, syncope and tachypnea.   Cardiovascular risk factors include advanced age (older than 64 for men, 85 for women), dyslipidemia, hypertension and male gender.  Use of agents associated with hypertension: thyroid hormones.     Weight trend: fluctuating a bit Wt Readings from Last 3 Encounters:  12/07/18 206 lb (93.4 kg)  11/08/18 204 lb (92.5 kg)  11/05/18 206 lb 9.6 oz (93.7 kg)    Current diet: well balanced  ------------------------------------------------------------------------  Lipid/Cholesterol, Follow-up:   Last seen for this 4 weeks ago.    Management changes since that visit include stopping Lovastatin. . Last Lipid Panel:    Component Value Date/Time   CHOL 146 11/08/2018 1103   TRIG 189 (H) 11/08/2018 1103   HDL 33 (L) 11/08/2018 1103   CHOLHDL 4.4 11/08/2018 1103   CHOLHDL 3.6 07/30/2017 0758   LDLCALC 75 11/08/2018 1103   LDLCALC 79 07/30/2017 0758    Risk factors for vascular disease include hypertension  He reports good compliance with treatment. He is not having side effects.  Current symptoms include none and have been stable. Weight trend: fluctuating a bit Prior visit with dietician: no Current diet: well balanced Current exercise: none  Wt Readings from Last 3 Encounters:  12/07/18 206 lb (93.4 kg)  11/08/18 204 lb (92.5 kg)  11/05/18 206 lb 9.6 oz (93.7 kg)    -------------------------------------------------------------------  No Known Allergies   Current Outpatient Medications:  .  ALPRAZolam (XANAX) 0.5 MG tablet, TAKE 1/2 TO 1 TABLET BY MOUTH 3 TIMES DAILY AS NEEDED, Disp: 60 tablet, Rfl: 5 .  amLODipine (NORVASC) 2.5 MG tablet, Take 1 tablet (2.5 mg total) by mouth daily., Disp: 30 tablet, Rfl: 1 .  diclofenac sodium (VOLTAREN) 1 % GEL, Apply 4 g topically 4 (four) times daily as needed., Disp: 100 g, Rfl: 1 .  ferrous sulfate 325 (65 FE) MG tablet, Take 325 mg by mouth daily with breakfast., Disp: , Rfl:  .  fluticasone (FLONASE) 50 MCG/ACT nasal spray, Place 2 sprays into both nostrils daily. (Patient taking differently: Place 2 sprays into both nostrils daily. As needed), Disp: 16 g, Rfl: 11 .  hydrocortisone (PROCTO-MED HC) 2.5 % rectal cream, APPLY 2 OR 3 TIMES A  DAY AS NEEDED, Disp: 30 g, Rfl: 5 .  hydrocortisone 2.5 % cream, apply to affected area two to three times a day as needed, Disp: 30 g, Rfl: 3 .  ibuprofen (ADVIL,MOTRIN) 200 MG tablet, Take 200 mg by mouth every 6 (six) hours as needed., Disp: , Rfl:  .  levothyroxine (SYNTHROID, LEVOTHROID) 137 MCG tablet, Take 1 tablet  (137 mcg total) by mouth daily before breakfast., Disp: 90 tablet, Rfl: 1 .  Menthol-Methyl Salicylate (MUSCLE RUB) 10-15 % CREA, Apply 1 application topically as needed for muscle pain., Disp: , Rfl:  .  MULTIPLE VITAMIN PO, Take 1 tablet by mouth every other day. , Disp: , Rfl:  .  Multiple Vitamins-Iron (MULTIVITAMIN/IRON PO), Take 1 tablet by mouth every other day., Disp: , Rfl:  .  naproxen (NAPROSYN) 500 MG tablet, TAKE ONE TABLET TWICE DAILY WITH A MEAL, Disp: 30 tablet, Rfl: 5 .  omeprazole (PRILOSEC) 20 MG capsule, Take 1 capsule (20 mg total) by mouth 2 (two) times daily before a meal., Disp: 60 capsule, Rfl: 12 .  Probiotic Product (PROBIOTIC PO), Take by mouth daily. , Disp: , Rfl:  .  tamsulosin (FLOMAX) 0.4 MG CAPS capsule, Take 1 capsule (0.4 mg total) by mouth daily., Disp: 90 capsule, Rfl: 3 .  lovastatin (MEVACOR) 20 MG tablet, TAKE ONE TABLET BY MOUTH EVERY DAY (Patient not taking: Reported on 12/07/2018), Disp: 90 tablet, Rfl: 4  Review of Systems  Constitutional: Negative for appetite change, chills and fever.  Respiratory: Negative for chest tightness, shortness of breath and wheezing.   Cardiovascular: Negative for chest pain and palpitations.  Gastrointestinal: Negative for abdominal pain, nausea and vomiting.    Social History   Tobacco Use  . Smoking status: Never Smoker  . Smokeless tobacco: Never Used  Substance Use Topics  . Alcohol use: No      Objective:   BP (!) 158/82 (BP Location: Left Arm, Patient Position: Sitting, Cuff Size: Large)   Pulse 84   Temp 98.5 F (36.9 C) (Oral)   Resp 18   Wt 206 lb (93.4 kg)   SpO2 95% Comment: room air  BMI 34.28 kg/m  Vitals:   12/07/18 1103  BP: (!) 158/82  Pulse: 84  Resp: 18  Temp: 98.5 F (36.9 C)  TempSrc: Oral  SpO2: 95%  Weight: 206 lb (93.4 kg)     Physical Exam  General appearance: alert, well developed, well nourished, cooperative and in no distress Head: Normocephalic, without obvious  abnormality, atraumatic Respiratory: Respirations even and unlabored, normal respiratory rate Extremities: No gross deformities .     Assessment & Plan    1. Essential (primary) hypertension Improved, but not at goal since starting amlodipine. Will double dose to - amLODipine (NORVASC) 5 MG tablet; Take 1 tablet (5 mg total) by mouth daily.  Dispense: 30 tablet; Refill: 3   Follow up in late April for BP and thyroid function check.     Lelon Huh, MD  [facility]

## 2019-01-03 ENCOUNTER — Other Ambulatory Visit: Payer: Self-pay | Admitting: Family Medicine

## 2019-01-03 DIAGNOSIS — F419 Anxiety disorder, unspecified: Secondary | ICD-10-CM

## 2019-01-19 ENCOUNTER — Other Ambulatory Visit: Payer: Self-pay | Admitting: Family Medicine

## 2019-03-04 ENCOUNTER — Ambulatory Visit: Payer: Self-pay | Admitting: Family Medicine

## 2019-03-22 ENCOUNTER — Telehealth: Payer: Self-pay

## 2019-03-22 DIAGNOSIS — E039 Hypothyroidism, unspecified: Secondary | ICD-10-CM

## 2019-03-22 DIAGNOSIS — I10 Essential (primary) hypertension: Secondary | ICD-10-CM

## 2019-03-22 NOTE — Telephone Encounter (Signed)
OK to print lab order, but we do need to check his BP since amlodipine was increased in February. Can change to nurse only visit if he likes.

## 2019-03-22 NOTE — Telephone Encounter (Signed)
Patient called wanting to know if he could just get labs done and cancel his appt on 04/08/2019. He reports that he wants to get in and out of the office as quick as he can to reduce exposure to covid. I reassured the patient that we are taking precautions (offering masks to well patients, social distancing, cleaning the office etc). However, he still wanted to check with you first. Please advise. Thanks!

## 2019-03-23 NOTE — Telephone Encounter (Signed)
Patient advised and verbally voiced understanding. Lab slip placed up front.

## 2019-03-24 DIAGNOSIS — E039 Hypothyroidism, unspecified: Secondary | ICD-10-CM | POA: Diagnosis not present

## 2019-03-24 DIAGNOSIS — I10 Essential (primary) hypertension: Secondary | ICD-10-CM | POA: Diagnosis not present

## 2019-03-25 LAB — TSH: TSH: 0.442 u[IU]/mL — ABNORMAL LOW (ref 0.450–4.500)

## 2019-03-29 ENCOUNTER — Other Ambulatory Visit: Payer: Self-pay | Admitting: Family Medicine

## 2019-03-29 DIAGNOSIS — I10 Essential (primary) hypertension: Secondary | ICD-10-CM

## 2019-03-30 ENCOUNTER — Telehealth: Payer: Self-pay

## 2019-03-30 MED ORDER — LEVOTHYROXINE SODIUM 137 MCG PO TABS: 137.0000 ug | ORAL_TABLET | Freq: Every day | ORAL | 4 refills | Status: DC

## 2019-03-30 NOTE — Telephone Encounter (Signed)
-----   Message from Birdie Sons, MD sent at 03/30/2019  8:22 AM EDT ----- TSH is good, continue current dose of levothyroxine

## 2019-03-30 NOTE — Telephone Encounter (Signed)
Advised patient of results. Patient is requesting refills of levothyroxine to be sent into the pharmacy. Ok to send? Please advise. [facility]. Thanks!

## 2019-04-08 ENCOUNTER — Other Ambulatory Visit: Payer: Self-pay

## 2019-04-08 ENCOUNTER — Ambulatory Visit: Payer: Medicare Other | Admitting: Family Medicine

## 2019-04-08 ENCOUNTER — Telehealth: Payer: Self-pay

## 2019-04-08 NOTE — Telephone Encounter (Signed)
LMTCB 04/08/2019  Thanks,   -Bevin Mayall  

## 2019-04-08 NOTE — Telephone Encounter (Signed)
Pt advised.   Thanks,   -Payton Prinsen  

## 2019-04-08 NOTE — Telephone Encounter (Signed)
Patient came in today for nurse blood pressure check. His reading was : 148/90 left arm. I was unable to recheck blood pressure in his right arm since he has had a past surgery in that arm. Patient was anxious about being in the office during Coronavirus pandemic.  Patient was last seen in the office for Hypertension on 12/07/2018. Changes made during that visit includes doubling the dose of Amlodipine to 5mg  daily. He brought in a blood pressure diary and reports that his home blood pressure readings have averaged around 137/73. Patient says he has white coat, and his blood pressure is always higher in our office. Please advise.

## 2019-04-08 NOTE — Progress Notes (Signed)
Patient came in today for nurse blood pressure check. His reading was : 148/90 left arm. I was unable to recheck blood pressure in his right arm since he has had a past surgery in that arm.  Patient was anxious about being in the office during Coronavirus pandemic  Patient was last seen in the office for Hypertension on 12/07/2018. Changes made during that visit includes doubling the dose of Amlodipine to 5mg  daily. He brought in a blood pressure diary and reports that his home blood pressure readings have averaged around 137/73. Patient says he has white coat, and his blood pressure is always higher in our office. This message was sent to Dr. Caryn Section in a separate telephone encounter for review.

## 2019-04-08 NOTE — Telephone Encounter (Signed)
Looks like its doing much better on higher dose of amlodipine. Just continue on the 5mg  tablets, check BP one or twice a week at home and let me know if it gets consistently above 140/90.

## 2019-04-18 ENCOUNTER — Other Ambulatory Visit: Payer: Self-pay | Admitting: Family Medicine

## 2019-04-29 ENCOUNTER — Other Ambulatory Visit: Payer: Self-pay

## 2019-04-29 ENCOUNTER — Encounter: Payer: Self-pay | Admitting: Family Medicine

## 2019-04-29 ENCOUNTER — Ambulatory Visit (INDEPENDENT_AMBULATORY_CARE_PROVIDER_SITE_OTHER): Payer: Medicare Other | Admitting: Family Medicine

## 2019-04-29 VITALS — BP 136/72 | HR 78 | Temp 98.6°F | Resp 16 | Ht 66.0 in | Wt 211.0 lb

## 2019-04-29 DIAGNOSIS — M26622 Arthralgia of left temporomandibular joint: Secondary | ICD-10-CM

## 2019-04-29 DIAGNOSIS — H9202 Otalgia, left ear: Secondary | ICD-10-CM | POA: Diagnosis not present

## 2019-04-29 MED ORDER — AMOXICILLIN 500 MG PO CAPS: 1000.0000 mg | ORAL_CAPSULE | Freq: Two times a day (BID) | ORAL | 0 refills | Status: DC

## 2019-04-29 NOTE — Progress Notes (Signed)
[name] Male    DOB: 1946/09/09   73 y.o.   MRN: [id number] Visit Date: 04/29/2019  Today's Provider: Lelon Huh, MD   Chief Complaint  Patient presents with  . Ear Pain   Subjective:     Otalgia  There is pain in the left ear. This is a new problem. The current episode started 1 to 4 weeks ago (about 2 weeks). The problem occurs constantly. The problem has been gradually worsening. There has been no fever. The pain is moderate. Associated symptoms include neck pain. Pertinent negatives include no coughing, ear discharge, rhinorrhea or sore throat. He has tried antibiotics (he had left over For about 3 days) for the symptoms. The treatment provided no relief.   Patient reports that the pain radiates from the left side of his temples all the way down to the left side of his neck. Pain is worse when chewing. He thought it could be a sinus issue, but OTC meds have not helped.  He did take 3 days of left over amoxicillin and seem to improve while taking it.    No Known Allergies   Current Outpatient Medications:  .  ALPRAZolam (XANAX) 0.5 MG tablet, 1/2 TO 1 TABLET BY MOUTH 3 TIMES DAILY AS NEEDED, Disp: 60 tablet, Rfl: 5 .  amLODipine (NORVASC) 5 MG tablet, TAKE 1 TABLET BY MOUTH DAILY, Disp: 30 tablet, Rfl: 12 .  diclofenac sodium (VOLTAREN) 1 % GEL, Apply 4 g topically 4 (four) times daily as needed., Disp: 100 g, Rfl: 1 .  ferrous sulfate 325 (65 FE) MG tablet, Take 325 mg by mouth daily with breakfast., Disp: , Rfl:  .  fluticasone (FLONASE) 50 MCG/ACT nasal spray, Place 2 sprays into both nostrils daily. (Patient taking differently: Place 2 sprays into both nostrils daily. As needed), Disp: 16 g, Rfl: 11 .  hydrocortisone (ANUSOL-HC) 2.5 % rectal cream, APPLY TO AFFECTED AREAS 2 OR 3 TIMES DAILY AS NEEDED, Disp: 30 g, Rfl: 5 .  ibuprofen (ADVIL,MOTRIN) 200 MG tablet, Take 200 mg by mouth every 6 (six) hours as needed., Disp: , Rfl:  .  levothyroxine  (SYNTHROID) 137 MCG tablet, Take 1 tablet (137 mcg total) by mouth daily before breakfast., Disp: 90 tablet, Rfl: 4 .  Menthol-Methyl Salicylate (MUSCLE RUB) 10-15 % CREA, Apply 1 application topically as needed for muscle pain., Disp: , Rfl:  .  MULTIPLE VITAMIN PO, Take 1 tablet by mouth every other day. , Disp: , Rfl:  .  Multiple Vitamins-Iron (MULTIVITAMIN/IRON PO), Take 1 tablet by mouth every other day., Disp: , Rfl:  .  naproxen (NAPROSYN) 500 MG tablet, TAKE ONE TABLET TWICE DAILY WITH A MEAL, Disp: 30 tablet, Rfl: 5 .  omeprazole (PRILOSEC) 20 MG capsule, Take 1 capsule (20 mg total) by mouth 2 (two) times daily before a meal., Disp: 60 capsule, Rfl: 12 .  Probiotic Product (PROBIOTIC PO), Take by mouth daily. , Disp: , Rfl:  .  tamsulosin (FLOMAX) 0.4 MG CAPS capsule, Take 1 capsule (0.4 mg total) by mouth daily., Disp: 90 capsule, Rfl: 3 .  hydrocortisone (PROCTO-MED HC) 2.5 % rectal cream, APPLY 2 OR 3 TIMES A DAY AS NEEDED, Disp: 30 g, Rfl: 5  Review of Systems  HENT: Positive for ear pain. Negative for ear discharge, rhinorrhea and sore throat.   Respiratory: Negative for cough.   Musculoskeletal: Positive for neck pain.    Social History   Tobacco Use  .  Smoking status: Never Smoker  . Smokeless tobacco: Never Used  Substance Use Topics  . Alcohol use: No      Objective:   BP 136/72 (BP Location: Left Arm, Patient Position: Sitting, Cuff Size: Large)   Pulse 78   Temp 98.6 F (37 C)   Resp 16   Ht 5\' 6"  (1.676 m)   Wt 211 lb (95.7 kg)   SpO2 95%   BMI 34.06 kg/m  Vitals:   04/29/19 1447  BP: 136/72  Pulse: 78  Resp: 16  Temp: 98.6 F (37 C)  SpO2: 95%  Weight: 211 lb (95.7 kg)  Height: 5\' 6"  (1.676 m)     Physical Exam    General Appearance:    Alert, cooperative, no distress HENT:   Ear canals clear, TMs normal. Tender postauricular and anterior cervical lymph nodes. Slight TMJ tenderness Eyes:    PERRL, conjunctiva/corneas clear, EOM's intact       Lungs:     Clear to auscultation bilaterally, respirations unlabored Heart:    Regular rate and rhythm Neurologic:   Awake, alert, oriented x 3. No apparent focal neurological           defect.        Assessment & Plan    1. Ear pain, left Her reports it did improve when he took amox for a few days. He may have mild OM which was improving, will complete course of antibiotic for possible OM - amoxicillin (AMOXIL) 500 MG capsule; Take 2 capsules (1,000 mg total) by mouth 2 (two) times daily for 7 days.  Dispense: 28 capsule; Refill: 0  2. Arthralgia of left temporomandibular joint Recommend ice and prn NSAID. Consider early follow up with dentist is not improving.      Lelon Huh, MD  [facility]

## 2019-04-29 NOTE — Patient Instructions (Signed)
.   Please review the attached list of medications and notify my office if there are any errors.   . Please bring all of your medications to every appointment so we can make sure that our medication list is the same as yours.   

## 2019-05-10 ENCOUNTER — Other Ambulatory Visit: Payer: Self-pay | Admitting: Family Medicine

## 2019-05-10 DIAGNOSIS — H9202 Otalgia, left ear: Secondary | ICD-10-CM

## 2019-05-10 MED ORDER — AMOXICILLIN 500 MG PO CAPS: 500.0000 mg | ORAL_CAPSULE | Freq: Two times a day (BID) | ORAL | 0 refills | Status: AC

## 2019-05-20 ENCOUNTER — Telehealth: Payer: Self-pay

## 2019-05-20 DIAGNOSIS — R6884 Jaw pain: Secondary | ICD-10-CM

## 2019-05-20 DIAGNOSIS — H9209 Otalgia, unspecified ear: Secondary | ICD-10-CM

## 2019-05-20 NOTE — Telephone Encounter (Signed)
Patient called saying that he was seen in the office about 1 month ago with jaw/ear pain. He has had 2 rounds of abx, and reports that his symptoms are still there. He has been off of the abx for about 3 days now. He is willing to be seen or send in another medication to help into the pharmacy. Please advise. Thanks!

## 2019-05-20 NOTE — Telephone Encounter (Signed)
If neither antibiotic helped then he needs to see ENT for further evaluation. Please advise enter order.

## 2019-05-20 NOTE — Telephone Encounter (Signed)
Pt advised.  Please refer to ENT.    Thanks,   -Mickel Baas

## 2019-05-23 ENCOUNTER — Other Ambulatory Visit: Payer: Self-pay | Admitting: Family Medicine

## 2019-05-23 DIAGNOSIS — N410 Acute prostatitis: Secondary | ICD-10-CM

## 2019-05-25 ENCOUNTER — Telehealth: Payer: Self-pay | Admitting: Family Medicine

## 2019-05-25 DIAGNOSIS — S0300XA Dislocation of jaw, unspecified side, initial encounter: Secondary | ICD-10-CM

## 2019-05-25 NOTE — Telephone Encounter (Signed)
Per [facility] pt will need to be referred to an oral surgeon. Their message states that they do not treat TMJ

## 2019-07-15 DIAGNOSIS — Z23 Encounter for immunization: Secondary | ICD-10-CM | POA: Diagnosis not present

## 2019-08-02 ENCOUNTER — Other Ambulatory Visit: Payer: Self-pay | Admitting: Family Medicine

## 2019-08-02 DIAGNOSIS — H6981 Other specified disorders of Eustachian tube, right ear: Secondary | ICD-10-CM

## 2019-09-02 DIAGNOSIS — L570 Actinic keratosis: Secondary | ICD-10-CM | POA: Diagnosis not present

## 2019-09-02 DIAGNOSIS — D2272 Melanocytic nevi of left lower limb, including hip: Secondary | ICD-10-CM | POA: Diagnosis not present

## 2019-09-02 DIAGNOSIS — Z85828 Personal history of other malignant neoplasm of skin: Secondary | ICD-10-CM | POA: Diagnosis not present

## 2019-09-02 DIAGNOSIS — D2262 Melanocytic nevi of left upper limb, including shoulder: Secondary | ICD-10-CM | POA: Diagnosis not present

## 2019-09-02 DIAGNOSIS — Z85820 Personal history of malignant melanoma of skin: Secondary | ICD-10-CM | POA: Diagnosis not present

## 2019-09-02 DIAGNOSIS — D2261 Melanocytic nevi of right upper limb, including shoulder: Secondary | ICD-10-CM | POA: Diagnosis not present

## 2019-11-01 NOTE — Progress Notes (Signed)
Subjective:   Christian Mora is a 73 y.o. male who presents for Medicare Annual/Subsequent preventive examination.    This visit is being conducted through telemedicine due to the COVID-19 pandemic. This patient has given me verbal consent via doximity to conduct this visit, patient states they are participating from their home address. Some vital signs may be absent or patient reported.    Patient identification: identified by name, DOB, and current address  Review of Systems:  N/A  Cardiac Risk Factors include: advanced age (>67men, >21 women);hypertension;male gender     Objective:    Vitals: There were no vitals taken for this visit.  There is no height or weight on file to calculate BMI. Unable to obtain vitals due to visit being conducted via telephonically.   Advanced Directives 11/07/2019 11/05/2018 03/03/2018 10/16/2015 07/30/2015  Does Patient Have a Medical Advance Directive? Yes Yes Yes Yes Yes  Type of Advance Directive Living will;Healthcare Power of [place];Living will [place];Living will Living will [place];Living will  Copy of [place] in Chart? Yes - validated most recent copy scanned in chart (See row information) Yes - validated most recent copy scanned in chart (See row information) No - copy requested - -    Tobacco Social History   Tobacco Use  Smoking Status Never Smoker  Smokeless Tobacco Never Used     Counseling given: Not Answered   Clinical Intake:  Pre-visit preparation completed: Yes  Pain : 0-10 Pain Score: 1  Pain Type: Acute pain Pain Location: Bladder(prostate) Pain Descriptors / Indicators: Pressure Pain Onset: In the past 7 days Pain Frequency: Intermittent     Nutritional Risks: None Diabetes: No  How often do you need to have someone help you when you read instructions, pamphlets, or other written materials from your doctor or pharmacy?: 1  - Never  Interpreter Needed?: No  Information entered by :: [facility], LPN  Past Medical History:  Diagnosis Date  . Allergy   . Anemia   . Anxiety   . GERD (gastroesophageal reflux disease)   . Hyperlipidemia   . Hypertension   . Hypothyroidism   . Thyroid disease    Past Surgical History:  Procedure Laterality Date  . COLONOSCOPY WITH PROPOFOL N/A 03/03/2018   Procedure: COLONOSCOPY WITH PROPOFOL;  Surgeon: Jonathon Bellows, MD;  Location: [facility] ENDOSCOPY;  Service: Gastroenterology;  Laterality: N/A;  . ESOPHAGOGASTRODUODENOSCOPY (EGD) WITH PROPOFOL N/A 03/03/2018   Procedure: ESOPHAGOGASTRODUODENOSCOPY (EGD) WITH PROPOFOL;  Surgeon: Jonathon Bellows, MD;  Location: [facility] ENDOSCOPY;  Service: Gastroenterology;  Laterality: N/A;  . GIVENS CAPSULE STUDY N/A 03/12/2018   Procedure: GIVENS CAPSULE STUDY;  Surgeon: Jonathon Bellows, MD;  Location: [facility] ENDOSCOPY;  Service: Gastroenterology;  Laterality: N/A;  . lymph node removed  2004  . TONSILLECTOMY AND ADENOIDECTOMY  1954   Family History  Problem Relation Age of Onset  . Hypertension Mother   . Cancer Father        prostate  . Diabetes Son    Social History   Socioeconomic History  . Marital status: Married    Spouse name: Not on file  . Number of children: 2  . Years of education: Not on file  . Highest education level: Bachelor's degree (e.g., BA, AB, BS)  Occupational History  . Occupation: retired  Tobacco Use  . Smoking status: Never Smoker  . Smokeless tobacco: Never Used  Substance and Sexual Activity  . Alcohol use: No  .  Drug use: No  . Sexual activity: Yes  Other Topics Concern  . Not on file  Social History Narrative  . Not on file   Social Determinants of Health   Financial Resource Strain: Low Risk   . Difficulty of Paying Living Expenses: Not hard at all  Food Insecurity: No Food Insecurity  . Worried About Charity fundraiser in the Last Year: Never true  . Ran Out of Food in the Last Year: Never true    Transportation Needs: No Transportation Needs  . Lack of Transportation (Medical): No  . Lack of Transportation (Non-Medical): No  Physical Activity: Inactive  . Days of Exercise per Week: 0 days  . Minutes of Exercise per Session: 0 min  Stress: No Stress Concern Present  . Feeling of Stress : Only a little  Social Connections: Slightly Isolated  . Frequency of Communication with Friends and Family: Twice a week  . Frequency of Social Gatherings with Friends and Family: Twice a week  . Attends Religious Services: More than 4 times per year  . Active Member of Clubs or Organizations: No  . Attends Archivist Meetings: Never  . Marital Status: Married    Outpatient Encounter Medications as of 11/07/2019  Medication Sig  . ALPRAZolam (XANAX) 0.5 MG tablet 1/2 TO 1 TABLET BY MOUTH 3 TIMES DAILY AS NEEDED  . amLODipine (NORVASC) 5 MG tablet TAKE 1 TABLET BY MOUTH DAILY  . augmented betamethasone dipropionate (DIPROLENE-AF) 0.05 % cream APPLY TO AFFECTED AREAS TWICE DAILY AS NEEDED  . diclofenac sodium (VOLTAREN) 1 % GEL Apply 4 g topically 4 (four) times daily as needed.  . fluticasone (FLONASE) 50 MCG/ACT nasal spray SPRAY TWICE INTO EACH NOSTRIL ONCE DAILY  . hydrocortisone (ANUSOL-HC) 2.5 % rectal cream APPLY TO AFFECTED AREAS 2 OR 3 TIMES DAILY AS NEEDED  . ibuprofen (ADVIL,MOTRIN) 200 MG tablet Take 200 mg by mouth every 6 (six) hours as needed.  Marland Kitchen levothyroxine (SYNTHROID) 137 MCG tablet TAKE 1 TABLET BY MOUTH DAILY BEFORE BREAKFAST  . MULTIPLE VITAMIN PO Take 1 tablet by mouth every other day.   . Multiple Vitamins-Iron (MULTIVITAMIN/IRON PO) Take 1 tablet by mouth every other day.  Marland Kitchen omeprazole (PRILOSEC) 20 MG capsule Take 1 capsule (20 mg total) by mouth 2 (two) times daily before a meal.  . tamsulosin (FLOMAX) 0.4 MG CAPS capsule TAKE 1 CAPSULE BY MOUTH EVERY DAY  . ferrous sulfate 325 (65 FE) MG tablet Take 325 mg by mouth daily with breakfast.  . hydrocortisone  (PROCTO-MED HC) 2.5 % rectal cream APPLY 2 OR 3 TIMES A DAY AS NEEDED (Patient not taking: Reported on 11/07/2019)  . Menthol-Methyl Salicylate (MUSCLE RUB) 10-15 % CREA Apply 1 application topically as needed for muscle pain.  . naproxen (NAPROSYN) 500 MG tablet TAKE ONE TABLET TWICE DAILY WITH A MEAL (Patient not taking: Reported on 11/07/2019)  . Probiotic Product (PROBIOTIC PO) Take by mouth daily.    No facility-administered encounter medications on file as of 11/07/2019.    Activities of Daily Living In your present state of health, do you have any difficulty performing the following activities: 11/07/2019  Hearing? N  Comment Wears bilateral hearing aids.  Vision? N  Difficulty concentrating or making decisions? N  Walking or climbing stairs? N  Dressing or bathing? N  Doing errands, shopping? N  Preparing Food and eating ? N  Using the Toilet? N  In the past six months, have you accidently leaked urine? Darreld Mclean  Comment Due to a possible UTI. Telephonic encounter created and sent to PCP.  Do you have problems with loss of bowel control? N  Managing your Medications? N  Managing your Finances? N  Housekeeping or managing your Housekeeping? N  Some recent data might be hidden    Patient Care Team: Birdie Sons, MD as PCP - General (Family Medicine) Dasher, Rayvon Char, MD (Dermatology) Birder Robson, MD as Referring Physician (Ophthalmology) Kipp Laurence, MD as Referring Physician (Audiology)   Assessment:   This is a routine wellness examination for [place].  Exercise Activities and Dietary recommendations Current Exercise Habits: The patient does not participate in regular exercise at present, Exercise limited by: None identified  Goals    . Exercise 3x per week (30 min per time)     Recommend to exercise for 3 days a week for at least 30 minutes at a time.         Fall Risk: Fall Risk  11/07/2019 11/05/2018 11/05/2017 07/30/2015  Falls in the past year? 0 0 No No  Number  falls in past yr: 0 0 - -  Injury with Fall? 0 0 - -    FALL RISK PREVENTION PERTAINING TO THE HOME:  Any stairs in or around the home? Yes  If so, are there any without handrails? No   Home free of loose throw rugs in walkways, pet beds, electrical cords, etc? Yes  Adequate lighting in your home to reduce risk of falls? Yes   ASSISTIVE DEVICES UTILIZED TO PREVENT FALLS:  Life alert? No  Use of a cane, walker or w/c? No  Grab bars in the bathroom? No  Shower chair or bench in shower? No Elevated toilet seat or a handicapped toilet? No   TIMED UP AND GO:  Was the test performed? No .    Depression Screen PHQ 2/9 Scores 11/07/2019 11/07/2019 11/05/2018 11/05/2017  PHQ - 2 Score 0 0 0 0    Cognitive Function: Declined today.      6CIT Screen 11/05/2018  What Year? 0 points  What month? 0 points  What time? 0 points  Count back from 20 0 points  Months in reverse 0 points  Repeat phrase 0 points  Total Score 0    Immunization History  Administered Date(s) Administered  . Influenza Split 10/06/2007, 08/10/2009, 08/03/2010, 08/13/2011, 08/13/2012  . Influenza, High Dose Seasonal PF 09/14/2014, 07/30/2015, 08/12/2016, 07/23/2018, 07/15/2019  . Influenza,inj,Quad PF,6+ Mos 07/20/2013  . Influenza-Unspecified 08/10/2017  . Pneumococcal Conjugate-13 09/14/2014  . Pneumococcal Polysaccharide-23 08/13/2012  . Tdap 11/10/2017  . Zoster 08/29/2013  . Zoster Recombinat (Shingrix) 01/27/2018, 04/22/2018    Qualifies for Shingles Vaccine? Completed series  Tdap: Up to date  Flu Vaccine: Up to date  Pneumococcal Vaccine: Completed series  Screening Tests Health Maintenance  Topic Date Due  . COLONOSCOPY  03/03/2021  . TETANUS/TDAP  11/11/2027  . INFLUENZA VACCINE  Completed  . Hepatitis C Screening  Completed  . PNA vac Low Risk Adult  Completed   Cancer Screenings:  Colorectal Screening: Completed 03/03/18. Repeat every 3 years.   Lung Cancer Screening: (Low Dose CT  Chest recommended if Age 94-80 years, 30 pack-year currently smoking OR have quit w/in 15years.) does not qualify.   Additional Screening:  Hepatitis C Screening: Up to date  Vision Screening: Recommended annual ophthalmology exams for early detection of glaucoma and other disorders of the eye.  Dental Screening: Recommended annual dental exams for proper oral hygiene  Community  Resource Referral:  CRR required this visit?  No        Plan:  I have personally reviewed and addressed the Medicare Annual Wellness questionnaire and have noted the following in the patient's chart:  A. Medical and social history B. Use of alcohol, tobacco or illicit drugs  C. Current medications and supplements D. Functional ability and status E.  Nutritional status F.  Physical activity G. Advance directives H. List of other physicians I.  Hospitalizations, surgeries, and ER visits in previous 12 months J.  [place] such as hearing and vision if needed, cognitive and depression L. Referrals and appointments   In addition, I have reviewed and discussed with patient certain preventive protocols, quality metrics, and best practice recommendations. A written personalized care plan for preventive services as well as general preventive health recommendations were provided to patient.   Glendora Score, LPN  [id number] Nurse Health Advisor   Nurse Notes: None.

## 2019-11-07 ENCOUNTER — Telehealth: Payer: Self-pay

## 2019-11-07 ENCOUNTER — Ambulatory Visit (INDEPENDENT_AMBULATORY_CARE_PROVIDER_SITE_OTHER): Payer: Medicare Other

## 2019-11-07 ENCOUNTER — Other Ambulatory Visit: Payer: Self-pay

## 2019-11-07 DIAGNOSIS — Z0000 Encounter for general adult medical examination without abnormal findings: Secondary | ICD-10-CM

## 2019-11-07 DIAGNOSIS — N410 Acute prostatitis: Secondary | ICD-10-CM

## 2019-11-07 MED ORDER — TOLTERODINE TARTRATE ER 2 MG PO CP24: 2.0000 mg | ORAL_CAPSULE | Freq: Every day | ORAL | 0 refills | Status: AC

## 2019-11-07 MED ORDER — CIPROFLOXACIN HCL 500 MG PO TABS: 500.0000 mg | ORAL_TABLET | Freq: Two times a day (BID) | ORAL | 0 refills | Status: AC

## 2019-11-07 NOTE — Telephone Encounter (Signed)
Patient advised.

## 2019-11-07 NOTE — Telephone Encounter (Signed)
Prescriptions sent to [facility] pharmacy

## 2019-11-07 NOTE — Telephone Encounter (Signed)
Spoke with pt today to complete a telephonic AWV and pt states that as of Friday (11/04/19) he started having bladder pressure and discomfort with urination. Pt states he has an enlarged prostate and has issues with that but this seems to more problematic. Pt is leaking urine occasionally after urination. Declines change in urine odor, color or blood seen in urine. Pt states this occurred before and was given Cipro and Tolterodine to use x 2 weeks. Pt declines scheduling an in office apt at this time due to Covid 19. Pt would like to know if Dr Caryn Section would prescribe these medications again. Please advise, thank you.

## 2019-11-07 NOTE — Patient Instructions (Addendum)
[name] , Thank you for taking time to come for your Medicare Wellness Visit. I appreciate your ongoing commitment to your health goals. Please review the following plan we discussed and let me know if I can assist you in the future.   Screening recommendations/referrals: Colonoscopy: Up to date, due 03/2021 Recommended yearly ophthalmology/optometry visit for glaucoma screening and checkup Recommended yearly dental visit for hygiene and checkup  Vaccinations: Influenza vaccine: Up to date Pneumococcal vaccine: Completed series Tdap vaccine: Up to date, due 11/2027 Shingles vaccine: Completed series    Advanced directives: Currently due  Conditions/risks identified: Recommend to exercise 3 days a week for at least 30 minutes at a time.   Next appointment: 03/21/20 @ 11:00 AM with Dr Caryn Section. Declined scheduling an AWV for 2022 at this time.   Preventive Care 40 Years and Older, Male Preventive care refers to lifestyle choices and visits with your health care provider that can promote health and wellness. What does preventive care include?  A yearly physical exam. This is also called an annual well check.  Dental exams once or twice a year.  Routine eye exams. Ask your health care provider how often you should have your eyes checked.  Personal lifestyle choices, including:  Daily care of your teeth and gums.  Regular physical activity.  Eating a healthy diet.  Avoiding tobacco and drug use.  Limiting alcohol use.  Practicing safe sex.  Taking low doses of aspirin every day.  Taking vitamin and mineral supplements as recommended by your health care provider. What happens during an annual well check? The services and screenings done by your health care provider during your annual well check will depend on your age, overall health, lifestyle risk factors, and family history of disease. Counseling  Your health care provider may ask you questions about your:  Alcohol  use.  Tobacco use.  Drug use.  Emotional well-being.  Home and relationship well-being.  Sexual activity.  Eating habits.  History of falls.  Memory and ability to understand (cognition).  Work and work Statistician. Screening  You may have the following tests or measurements:  Height, weight, and BMI.  Blood pressure.  Lipid and cholesterol levels. These may be checked every 5 years, or more frequently if you are over 40 years old.  Skin check.  Lung cancer screening. You may have this screening every year starting at age 31 if you have a 30-pack-year history of smoking and currently smoke or have quit within the past 15 years.  Fecal occult blood test (FOBT) of the stool. You may have this test every year starting at age 45.  Flexible sigmoidoscopy or colonoscopy. You may have a sigmoidoscopy every 5 years or a colonoscopy every 10 years starting at age 48.  Prostate cancer screening. Recommendations will vary depending on your family history and other risks.  Hepatitis C blood test.  Hepatitis B blood test.  Sexually transmitted disease (STD) testing.  Diabetes screening. This is done by checking your blood sugar (glucose) after you have not eaten for a while (fasting). You may have this done every 1-3 years.  Abdominal aortic aneurysm (AAA) screening. You may need this if you are a current or former smoker.  Osteoporosis. You may be screened starting at age 16 if you are at high risk. Talk with your health care provider about your test results, treatment options, and if necessary, the need for more tests. Vaccines  Your health care provider may recommend certain vaccines, such as:  Influenza vaccine. This is recommended every year.  Tetanus, diphtheria, and acellular pertussis (Tdap, Td) vaccine. You may need a Td booster every 10 years.  Zoster vaccine. You may need this after age 19.  Pneumococcal 13-valent conjugate (PCV13) vaccine. One dose is  recommended after age 32.  Pneumococcal polysaccharide (PPSV23) vaccine. One dose is recommended after age 51. Talk to your health care provider about which screenings and vaccines you need and how often you need them. This information is not intended to replace advice given to you by your health care provider. Make sure you discuss any questions you have with your health care provider. Document Released: 11/16/2015 Document Revised: 07/09/2016 Document Reviewed: 08/21/2015 Elsevier Interactive Patient Education  2017 [place] Prevention in the Home Falls can cause injuries. They can happen to people of all ages. There are many things you can do to make your home safe and to help prevent falls. What can I do on the outside of my home?  Regularly fix the edges of walkways and driveways and fix any cracks.  Remove anything that might make you trip as you walk through a door, such as a raised step or threshold.  Trim any bushes or trees on the path to your home.  Use bright outdoor lighting.  Clear any walking paths of anything that might make someone trip, such as rocks or tools.  Regularly check to see if handrails are loose or broken. Make sure that both sides of any steps have handrails.  Any raised decks and porches should have guardrails on the edges.  Have any leaves, snow, or ice cleared regularly.  Use sand or salt on walking paths during winter.  Clean up any spills in your garage right away. This includes oil or grease spills. What can I do in the bathroom?  Use night lights.  Install grab bars by the toilet and in the tub and shower. Do not use towel bars as grab bars.  Use non-skid mats or decals in the tub or shower.  If you need to sit down in the shower, use a plastic, non-slip stool.  Keep the floor dry. Clean up any water that spills on the floor as soon as it happens.  Remove soap buildup in the tub or shower regularly.  Attach bath mats  securely with double-sided non-slip rug tape.  Do not have throw rugs and other things on the floor that can make you trip. What can I do in the bedroom?  Use night lights.  Make sure that you have a light by your bed that is easy to reach.  Do not use any sheets or blankets that are too big for your bed. They should not hang down onto the floor.  Have a firm chair that has side arms. You can use this for support while you get dressed.  Do not have throw rugs and other things on the floor that can make you trip. What can I do in the kitchen?  Clean up any spills right away.  Avoid walking on wet floors.  Keep items that you use a lot in easy-to-reach places.  If you need to reach something above you, use a strong step stool that has a grab bar.  Keep electrical cords out of the way.  Do not use floor polish or wax that makes floors slippery. If you must use wax, use non-skid floor wax.  Do not have throw rugs and other things on the floor that  can make you trip. What can I do with my stairs?  Do not leave any items on the stairs.  Make sure that there are handrails on both sides of the stairs and use them. Fix handrails that are broken or loose. Make sure that handrails are as long as the stairways.  Check any carpeting to make sure that it is firmly attached to the stairs. Fix any carpet that is loose or worn.  Avoid having throw rugs at the top or bottom of the stairs. If you do have throw rugs, attach them to the floor with carpet tape.  Make sure that you have a light switch at the top of the stairs and the bottom of the stairs. If you do not have them, ask someone to add them for you. What else can I do to help prevent falls?  Wear shoes that:  Do not have high heels.  Have rubber bottoms.  Are comfortable and fit you well.  Are closed at the toe. Do not wear sandals.  If you use a stepladder:  Make sure that it is fully opened. Do not climb a closed  stepladder.  Make sure that both sides of the stepladder are locked into place.  Ask someone to hold it for you, if possible.  Clearly mark and make sure that you can see:  Any grab bars or handrails.  First and last steps.  Where the edge of each step is.  Use tools that help you move around (mobility aids) if they are needed. These include:  Canes.  Walkers.  Scooters.  Crutches.  Turn on the lights when you go into a dark area. Replace any light bulbs as soon as they burn out.  Set up your furniture so you have a clear path. Avoid moving your furniture around.  If any of your floors are uneven, fix them.  If there are any pets around you, be aware of where they are.  Review your medicines with your doctor. Some medicines can make you feel dizzy. This can increase your chance of falling. Ask your doctor what other things that you can do to help prevent falls. This information is not intended to replace advice given to you by your health care provider. Make sure you discuss any questions you have with your health care provider. Document Released: 08/16/2009 Document Revised: 03/27/2016 Document Reviewed: 11/24/2014 Elsevier Interactive Patient Education  2017 [employer].

## 2019-11-11 ENCOUNTER — Ambulatory Visit: Payer: Federal, State, Local not specified - PPO

## 2019-11-25 ENCOUNTER — Other Ambulatory Visit: Payer: Self-pay | Admitting: Family Medicine

## 2019-11-25 DIAGNOSIS — K219 Gastro-esophageal reflux disease without esophagitis: Secondary | ICD-10-CM

## 2019-12-08 ENCOUNTER — Ambulatory Visit: Payer: Medicare Other | Attending: Internal Medicine

## 2019-12-08 DIAGNOSIS — Z20822 Contact with and (suspected) exposure to covid-19: Secondary | ICD-10-CM | POA: Diagnosis not present

## 2019-12-09 LAB — NOVEL CORONAVIRUS, NAA: SARS-CoV-2, NAA: NOT DETECTED

## 2019-12-16 ENCOUNTER — Other Ambulatory Visit: Payer: Self-pay

## 2019-12-16 ENCOUNTER — Ambulatory Visit: Payer: Medicare Other | Attending: Internal Medicine

## 2019-12-16 DIAGNOSIS — Z23 Encounter for immunization: Secondary | ICD-10-CM | POA: Insufficient documentation

## 2019-12-16 NOTE — Progress Notes (Signed)
   Covid-19 Vaccination Clinic  Name:  Christian Mora    MRN: [id number] DOB: [birth year]  12/16/2019  [name] was observed post Covid-19 immunization for 15 minutes without incidence. He was provided with Vaccine Information Sheet and instruction to access the V-Safe system.   [name] was instructed to call 911 with any severe reactions post vaccine: Marland Kitchen Difficulty breathing  . Swelling of your face and throat  . A fast heartbeat  . A bad rash all over your body  . Dizziness and weakness    Immunizations Administered    Name Date Dose VIS Date Route   Moderna COVID-19 Vaccine 12/16/2019  1:01 PM 0.5 mL 10/04/2019 Intramuscular   Manufacturer: Moderna   Lot: [id number]   [place]:[id number]

## 2020-01-16 ENCOUNTER — Ambulatory Visit: Payer: Medicare Other | Attending: Internal Medicine

## 2020-01-16 DIAGNOSIS — Z23 Encounter for immunization: Secondary | ICD-10-CM

## 2020-01-16 NOTE — Progress Notes (Signed)
   Covid-19 Vaccination Clinic  Name:  Christian Mora    MRN: [id number] DOB: 06-04-1946  01/16/2020  Christian Mora was observed post Covid-19 immunization for 15 minutes without incident. He was provided with Vaccine Information Sheet and instruction to access the V-Safe system.   Christian Mora was instructed to call 911 with any severe reactions post vaccine: Marland Kitchen Difficulty breathing  . Swelling of face and throat  . A fast heartbeat  . A bad rash all over body  . Dizziness and weakness   Immunizations Administered    Name Date Dose VIS Date Route   Moderna COVID-19 Vaccine 01/16/2020 12:46 PM 0.5 mL 10/04/2019 Intramuscular   Manufacturer: Moderna   Lot: [id number]   [place]:[id number]

## 2020-01-25 ENCOUNTER — Telehealth: Payer: Self-pay

## 2020-01-25 NOTE — Telephone Encounter (Signed)
Patient's son was recently diagnosed with Celiac disease. Patient would like a blood test to check for Celiac disease. Please advise.

## 2020-01-25 NOTE — Telephone Encounter (Addendum)
Celiac disease causes abdominal cramping or frequent or bloody diarrhea and the only treatment is a gluten free diet. Also, it is usually apparent on endoscopy and he had a normal endoscopy in 2019.  Does he have any of those symptoms? If not then there is nothing you would different regardless of the test result.

## 2020-01-25 NOTE — Telephone Encounter (Signed)
Copied from [place] [id number]. Topic: General - Inquiry >> Jan 25, 2020 11:26 AM Alease Frame wrote: Reason for CRM:  Patient is requesting blood work to be done . Please advise for sch

## 2020-01-26 NOTE — Telephone Encounter (Signed)
Patient's wife Gasper Sells advised.

## 2020-02-01 DIAGNOSIS — H43813 Vitreous degeneration, bilateral: Secondary | ICD-10-CM | POA: Diagnosis not present

## 2020-03-20 NOTE — Progress Notes (Signed)
Trena Platt Cummings,acting as a scribe for Lelon Huh, MD.,have documented all relevant documentation on the behalf of Lelon Huh, MD,as directed by  Lelon Huh, MD while in the presence of Lelon Huh, MD.  Established patient visit   [name]   DOB: Mar 26, 1946   74 y.o. Male  MRN: [id number] Visit Date: 03/21/2020  Today's healthcare provider: Lelon Huh, MD   Chief Complaint  Patient presents with  . Hypertension  . Hypothyroidism  . Hyperlipidemia   Subjective    HPI Hypertension, follow-up  BP Readings from Last 3 Encounters:  03/21/20 (!) 163/96  04/29/19 136/72  04/08/19 (!) 148/90   Wt Readings from Last 3 Encounters:  03/21/20 202 lb 9.6 oz (91.9 kg)  04/29/19 211 lb (95.7 kg)  12/07/18 206 lb (93.4 kg)     He was last seen for hypertension 11/08/2018  BP at that visit was 186/92. Management since that visit includes started Amlodipine 2.5mg  then increased to 5 mg.  He reports excellent compliance with treatment. He is not having side effects.  He is following a Regular diet. He is exercising. He does not smoke.  Use of agents associated with hypertension: thyroid hormones.   Outside blood pressures are 137/77. Symptoms: No chest pain No chest pressure  No palpitations No syncope  No dyspnea No orthopnea  No paroxysmal nocturnal dyspnea No lower extremity edema   Pertinent labs: Lab Results  Component Value Date   CHOL 146 11/08/2018   HDL 33 (L) 11/08/2018   LDLCALC 75 11/08/2018   TRIG 189 (H) 11/08/2018   CHOLHDL 4.4 11/08/2018   Lab Results  Component Value Date   NA 138 11/08/2018   K 3.9 11/08/2018   CREATININE 1.03 11/08/2018   GFRNONAA 72 11/08/2018   GFRAA 84 11/08/2018   GLUCOSE 99 11/08/2018     The ASCVD Risk score (Goff DC Jr., et al., 2013) failed to calculate for the following reasons:   Unable to determine if patient is Non-Hispanic African American    --------------------------------------------------------------------------------------------------- Hypothyroid, follow-up  Lab Results  Component Value Date   TSH 0.442 (L) 03/24/2019   TSH 0.289 (L) 11/08/2018   TSH 0.334 (L) 06/15/2018   Wt Readings from Last 3 Encounters:  03/21/20 202 lb 9.6 oz (91.9 kg)  04/29/19 211 lb (95.7 kg)  12/07/18 206 lb (93.4 kg)    He was last seen for hypothyroid 11/08/2018  Management since that visit includes reduce Levothyroxine to 137 mcg. He reports excellent compliance with treatment. He is not having side effects.  Symptoms: No change in energy level No constipation No diarrhea No heat / cold intolerance No nervousness No palpitations No weight changes  ----------------------------------------------------------------------------------------- Lipid/Cholesterol, Follow-up  Last lipid panel Other pertinent labs  Lab Results  Component Value Date   CHOL 146 11/08/2018   HDL 33 (L) 11/08/2018   LDLCALC 75 11/08/2018   TRIG 189 (H) 11/08/2018   CHOLHDL 4.4 11/08/2018   Lab Results  Component Value Date   ALT 17 11/08/2018   AST 19 11/08/2018   PLT 192 11/08/2018   TSH 0.442 (L) 03/24/2019     He was last seen for this 11/08/2018  Management since that visit includes no change.  He reports excellent compliance with treatment. He is not having side effects.   Symptoms: No chest pain No chest pressure/discomfort  No dyspnea No lower extremity edema  No numbness or tingling of extremity No orthopnea  No palpitations No paroxysmal nocturnal  dyspnea  No speech difficulty No syncope   Current diet: in general, a "healthy" diet   Current exercise: walking and yard work  The ASCVD Risk score Mikey Bussing DC Brooke Bonito., et al., 2013) failed to calculate for the following reasons:   Unable to determine if patient is Non-Hispanic African  American  ---------------------------------------------------------------------------------------------------     Social History   Tobacco Use  . Smoking status: Never Smoker  . Smokeless tobacco: Never Used  Substance Use Topics  . Alcohol use: No  . Drug use: No       Medications: Outpatient Medications Prior to Visit  Medication Sig  . ALPRAZolam (XANAX) 0.5 MG tablet 1/2 TO 1 TABLET BY MOUTH 3 TIMES DAILY AS NEEDED  . amLODipine (NORVASC) 5 MG tablet TAKE 1 TABLET BY MOUTH DAILY  . augmented betamethasone dipropionate (DIPROLENE-AF) 0.05 % cream APPLY TO AFFECTED AREAS TWICE DAILY AS NEEDED  . diclofenac sodium (VOLTAREN) 1 % GEL Apply 4 g topically 4 (four) times daily as needed.  . ferrous sulfate 325 (65 FE) MG tablet Take 325 mg by mouth daily with breakfast.  . fluticasone (FLONASE) 50 MCG/ACT nasal spray SPRAY TWICE INTO EACH NOSTRIL ONCE DAILY  . hydrocortisone (ANUSOL-HC) 2.5 % rectal cream APPLY TO AFFECTED AREAS 2 OR 3 TIMES DAILY AS NEEDED  . ibuprofen (ADVIL,MOTRIN) 200 MG tablet Take 200 mg by mouth every 6 (six) hours as needed.  Marland Kitchen levothyroxine (SYNTHROID) 137 MCG tablet TAKE 1 TABLET BY MOUTH DAILY BEFORE BREAKFAST  . Menthol-Methyl Salicylate (MUSCLE RUB) 10-15 % CREA Apply 1 application topically as needed for muscle pain.  Marland Kitchen MULTIPLE VITAMIN PO Take 1 tablet by mouth every other day.   . Multiple Vitamins-Iron (MULTIVITAMIN/IRON PO) Take 1 tablet by mouth every other day.  Marland Kitchen omeprazole (PRILOSEC) 20 MG capsule TAKE 1 CAPSULE BY MOUTH TWICE DAILY BEFORE A MEAL  . Probiotic Product (PROBIOTIC PO) Take by mouth daily.   . tamsulosin (FLOMAX) 0.4 MG CAPS capsule TAKE 1 CAPSULE BY MOUTH EVERY DAY  . hydrocortisone (PROCTO-MED HC) 2.5 % rectal cream APPLY 2 OR 3 TIMES A DAY AS NEEDED (Patient not taking: Reported on 11/07/2019)  . [DISCONTINUED] naproxen (NAPROSYN) 500 MG tablet TAKE ONE TABLET TWICE DAILY WITH A MEAL (Patient not taking: Reported on 11/07/2019)    No facility-administered medications prior to visit.    Review of Systems     Objective    BP (!) 163/96 (BP Location: Left Arm, Patient Position: Sitting, Cuff Size: Large)   Pulse 84   Temp (!) 97.1 F (36.2 C) (Temporal)   Ht 5\' 6"  (1.676 m)   Wt 202 lb 9.6 oz (91.9 kg)   BMI 32.70 kg/m      General: Appearance:    Overweight male in no acute distress  Eyes:    PERRL, conjunctiva/corneas clear, EOM's intact       Lungs:     Clear to auscultation bilaterally, respirations unlabored  Heart:    Normal heart rate. Normal rhythm. No murmurs, rubs, or gallops.   MS:   All extremities are intact.   Neurologic:   Awake, alert, oriented x 3. No apparent focal neurological           defect.        No results found for any visits on 03/21/20.  Assessment & Plan     1. Essential (primary) hypertension SBP not to goal. Discussed options of adding second medication versus increases amlodipine, which is he tolerating well. Will  first try increased dose of amlodipine.  - CBC with Differential - Comprehensive Metabolic Panel (CMET) - amLODipine (NORVASC) 10 MG tablet; Take 1 tablet (10 mg total) by mouth daily.  Dispense: 90 tablet; Refill: 1  2. Adult hypothyroidism  - TSH  3. Hyperlipidemia, unspecified hyperlipidemia type Diet controlled.  - Lipid Profile  4. Prostate cancer screening  - PSA Total (Reflex To Free) ([facility] only)  5. Loose stools He does have celiac disease in family and would like to be tested due to occasional problems with gassiness and loose stools.  - IgA - Tissue transglutaminase, IgA  6. Anxiety refill - ALPRAZolam (XANAX) 0.5 MG tablet; 1/2 TO 1 TABLET BY MOUTH 3 TIMES DAILY AS NEEDED  Dispense: 60 tablet; Refill: 5  7. Eustachian tube dysfunction, right refill - fluticasone (FLONASE) 50 MCG/ACT nasal spray; SPRAY TWICE INTO EACH NOSTRIL ONCE DAILY  Dispense: 16 g; Refill: 3   No follow-ups on file.      The entirety of the information  documented in the History of Present Illness, Review of Systems and Physical Exam were personally obtained by me. Portions of this information were initially documented by the CMA and reviewed by me for thoroughness and accuracy.      Lelon Huh, MD  [facility] [phone] (phone) [phone] (fax)  [place]

## 2020-03-21 ENCOUNTER — Ambulatory Visit (INDEPENDENT_AMBULATORY_CARE_PROVIDER_SITE_OTHER): Payer: Medicare Other | Admitting: Family Medicine

## 2020-03-21 ENCOUNTER — Other Ambulatory Visit: Payer: Self-pay

## 2020-03-21 ENCOUNTER — Encounter: Payer: Self-pay | Admitting: Family Medicine

## 2020-03-21 ENCOUNTER — Other Ambulatory Visit: Payer: Self-pay | Admitting: Family Medicine

## 2020-03-21 VITALS — BP 163/96 | HR 84 | Temp 97.1°F | Ht 66.0 in | Wt 202.6 lb

## 2020-03-21 DIAGNOSIS — I10 Essential (primary) hypertension: Secondary | ICD-10-CM

## 2020-03-21 DIAGNOSIS — R195 Other fecal abnormalities: Secondary | ICD-10-CM | POA: Diagnosis not present

## 2020-03-21 DIAGNOSIS — F419 Anxiety disorder, unspecified: Secondary | ICD-10-CM | POA: Diagnosis not present

## 2020-03-21 DIAGNOSIS — E785 Hyperlipidemia, unspecified: Secondary | ICD-10-CM | POA: Diagnosis not present

## 2020-03-21 DIAGNOSIS — E039 Hypothyroidism, unspecified: Secondary | ICD-10-CM

## 2020-03-21 DIAGNOSIS — Z125 Encounter for screening for malignant neoplasm of prostate: Secondary | ICD-10-CM

## 2020-03-21 DIAGNOSIS — H6981 Other specified disorders of Eustachian tube, right ear: Secondary | ICD-10-CM | POA: Diagnosis not present

## 2020-03-21 MED ORDER — ALPRAZOLAM 0.5 MG PO TABS: ORAL_TABLET | ORAL | 5 refills | Status: DC

## 2020-03-21 MED ORDER — FLUTICASONE PROPIONATE 50 MCG/ACT NA SUSP: NASAL | 3 refills | Status: DC

## 2020-03-21 MED ORDER — AMLODIPINE BESYLATE 10 MG PO TABS: 10.0000 mg | ORAL_TABLET | Freq: Every day | ORAL | 1 refills | Status: DC

## 2020-03-21 NOTE — Progress Notes (Signed)
I,Roshena L Chambers,acting as a scribe for Lelon Huh, MD.,have documented all relevant documentation on the behalf of Lelon Huh, MD,as directed by  Lelon Huh, MD while in the presence of Lelon Huh, MD.  Established patient visit   [name]   DOB: 05/04/1946   74 y.o. Male  MRN: [id number] Visit Date: 06/25/2020  Today's healthcare provider: Lelon Huh, MD   Chief Complaint  Patient presents with  . Hypertension   Subjective    HPI Hypertension, follow-up  BP Readings from Last 3 Encounters:  06/25/20 128/70  03/21/20 (!) 163/96  04/29/19 136/72   Wt Readings from Last 3 Encounters:  06/25/20 204 lb (92.5 kg)  03/21/20 202 lb 9.6 oz (91.9 kg)  04/29/19 211 lb (95.7 kg)     He was last seen for hypertension on 06/25/20. . BP at that visit was 163/96. Management since that visit includes increasing amlodipine from 5mg  to 10mg  daily due to uncontrolled blood pressure.   He reports good compliance with treatment. He is not having side effects.  He is following a Regular diet. He is not exercising. He does not smoke.  Use of agents associated with hypertension: thyroid hormones.   Outside blood pressures are checked occasionally. He uses a wrist monitor and results average between 110-130/ 65-80. Symptoms: No chest pain No chest pressure  No palpitations No syncope  No dyspnea No orthopnea  No paroxysmal nocturnal dyspnea No lower extremity edema   Pertinent labs: Lab Results  Component Value Date   CHOL 194 03/21/2020   HDL 41 03/21/2020   LDLCALC 127 (H) 03/21/2020   TRIG 142 03/21/2020   CHOLHDL 4.7 03/21/2020   Lab Results  Component Value Date   NA 138 03/21/2020   K 4.1 03/21/2020   CREATININE 1.07 03/21/2020   GFRNONAA 68 03/21/2020   GFRAA 79 03/21/2020   GLUCOSE 105 (H) 03/21/2020     The 10-year ASCVD risk score Mikey Bussing DC Jr., et al., 2013) is: 28.2%    ---------------------------------------------------------------------------------------------------     Medications: Outpatient Medications Prior to Visit  Medication Sig  . ALPRAZolam (XANAX) 0.5 MG tablet 1/2 TO 1 TABLET BY MOUTH 3 TIMES DAILY AS NEEDED  . amLODipine (NORVASC) 10 MG tablet Take 1 tablet (10 mg total) by mouth daily.  Marland Kitchen augmented betamethasone dipropionate (DIPROLENE-AF) 0.05 % cream APPLY TO AFFECTED AREAS TWICE DAILY AS NEEDED  . diclofenac sodium (VOLTAREN) 1 % GEL Apply 4 g topically 4 (four) times daily as needed.  . fluticasone (FLONASE) 50 MCG/ACT nasal spray SPRAY TWICE INTO EACH NOSTRIL ONCE DAILY  . hydrocortisone (ANUSOL-HC) 2.5 % rectal cream APPLY TO AFFECTED AREAS 2 OR 3 TIMES DAILY AS NEEDED  . ibuprofen (ADVIL,MOTRIN) 200 MG tablet Take 200 mg by mouth every 6 (six) hours as needed.  Marland Kitchen levothyroxine (SYNTHROID) 137 MCG tablet TAKE 1 TABLET BY MOUTH DAILY BEFORE BREAKFAST  . MULTIPLE VITAMIN PO Take 1 tablet by mouth every other day.   . Multiple Vitamins-Iron (MULTIVITAMIN/IRON PO) Take 1 tablet by mouth every other day.  Marland Kitchen omeprazole (PRILOSEC) 20 MG capsule TAKE 1 CAPSULE BY MOUTH TWICE DAILY BEFORE A MEAL  . tamsulosin (FLOMAX) 0.4 MG CAPS capsule TAKE 1 CAPSULE BY MOUTH EVERY DAY  . [DISCONTINUED] ferrous sulfate 325 (65 FE) MG tablet Take 325 mg by mouth daily with breakfast. (Patient not taking: Reported on 06/25/2020)  . [DISCONTINUED] hydrocortisone (PROCTO-MED HC) 2.5 % rectal cream APPLY 2 OR 3 TIMES A DAY  AS NEEDED (Patient not taking: Reported on 11/07/2019)  . [DISCONTINUED] Menthol-Methyl Salicylate (MUSCLE RUB) 10-15 % CREA Apply 1 application topically as needed for muscle pain. (Patient not taking: Reported on 06/25/2020)  . [DISCONTINUED] Probiotic Product (PROBIOTIC PO) Take by mouth daily.  (Patient not taking: Reported on 06/25/2020)   No facility-administered medications prior to visit.    Review of Systems  Constitutional: Negative  for appetite change, chills and fever.  Respiratory: Negative for chest tightness, shortness of breath and wheezing.   Cardiovascular: Negative for chest pain and palpitations.  Gastrointestinal: Negative for abdominal pain, nausea and vomiting.     Objective    BP 128/70 (BP Location: Left Arm, Patient Position: Sitting, Cuff Size: Large)   Pulse 80   Temp 98.3 F (36.8 C) (Oral)   Resp 16   Wt 204 lb (92.5 kg)   SpO2 97% Comment: room air  BMI 32.93 kg/m   Physical Exam  General appearance: Overweight male, cooperative and in no acute distress Head: Normocephalic, without obvious abnormality, atraumatic Respiratory: Respirations even and unlabored, normal respiratory rate Extremities: All extremities are intact.  Skin: Skin color, texture, turgor normal. No rashes seen  Psych: Appropriate mood and affect. Neurologic: Mental status: Alert, oriented to person, place, and time, thought content appropriate.     Assessment & Plan     1. Essential (primary) hypertension Much better with increased dose of amlodipine. Continue current medications.    2. Benign prostatic hyperplasia with urinary hesitancy Needs refill- tamsulosin (FLOMAX) 0.4 MG CAPS capsule; Take 1 capsule (0.4 mg total) by mouth daily.  Dispense: 90 capsule; Refill: 4  3. Hemorrhoids refill- hydrocortisone (ANUSOL-HC) 2.5 % rectal cream; APPLY TO AFFECTED AREAS 2 OR 3 TIMES DAILY AS NEEDED  Dispense: 30 g; Refill: 5   Follow up in October as scheduled for [facility].       The entirety of the information documented in the History of Present Illness, Review of Systems and Physical Exam were personally obtained by me. Portions of this information were initially documented by the CMA and reviewed by me for thoroughness and accuracy.      Lelon Huh, MD  [facility] [phone] (phone) [phone] (fax)  [place]

## 2020-03-22 ENCOUNTER — Other Ambulatory Visit: Payer: Self-pay | Admitting: Family Medicine

## 2020-03-22 MED ORDER — LEVOTHYROXINE SODIUM 137 MCG PO TABS: ORAL_TABLET | ORAL | 4 refills | Status: DC

## 2020-03-23 ENCOUNTER — Telehealth: Payer: Self-pay

## 2020-03-23 LAB — COMPREHENSIVE METABOLIC PANEL
ALT: 17 IU/L (ref 0–44)
AST: 23 IU/L (ref 0–40)
Albumin/Globulin Ratio: 1.2 (ref 1.2–2.2)
Albumin: 4.3 g/dL (ref 3.7–4.7)
Alkaline Phosphatase: 75 IU/L (ref 48–121)
BUN/Creatinine Ratio: 13 (ref 10–24)
BUN: 14 mg/dL (ref 8–27)
Bilirubin Total: 0.7 mg/dL (ref 0.0–1.2)
CO2: 22 mmol/L (ref 20–29)
Calcium: 9 mg/dL (ref 8.6–10.2)
Chloride: 103 mmol/L (ref 96–106)
Creatinine, Ser: 1.07 mg/dL (ref 0.76–1.27)
GFR calc Af Amer: 79 mL/min/{1.73_m2} (ref >59)
GFR calc non Af Amer: 68 mL/min/{1.73_m2} (ref >59)
Globulin, Total: 3.5 g/dL (ref 1.5–4.5)
Glucose: 105 mg/dL — ABNORMAL HIGH (ref 65–99)
Potassium: 4.1 mmol/L (ref 3.5–5.2)
Sodium: 138 mmol/L (ref 134–144)
Total Protein: 7.8 g/dL (ref 6.0–8.5)

## 2020-03-23 LAB — LIPID PANEL
Chol/HDL Ratio: 4.7 ratio (ref 0.0–5.0)
Cholesterol, Total: 194 mg/dL (ref 100–199)
HDL: 41 mg/dL (ref >39)
LDL Chol Calc (NIH): 127 mg/dL — ABNORMAL HIGH (ref 0–99)
Triglycerides: 142 mg/dL (ref 0–149)
VLDL Cholesterol Cal: 26 mg/dL (ref 5–40)

## 2020-03-23 LAB — CBC WITH DIFFERENTIAL/PLATELET
Basophils Absolute: 0.1 10*3/uL (ref 0.0–0.2)
Basos: 1 %
EOS (ABSOLUTE): 0.2 10*3/uL (ref 0.0–0.4)
Eos: 4 %
Hematocrit: 42.7 % (ref 37.5–51.0)
Hemoglobin: 14.2 g/dL (ref 13.0–17.7)
Immature Grans (Abs): 0 10*3/uL (ref 0.0–0.1)
Immature Granulocytes: 1 %
Lymphocytes Absolute: 1.6 10*3/uL (ref 0.7–3.1)
Lymphs: 33 %
MCH: 29 pg (ref 26.6–33.0)
MCHC: 33.3 g/dL (ref 31.5–35.7)
MCV: 87 fL (ref 79–97)
Monocytes Absolute: 0.4 10*3/uL (ref 0.1–0.9)
Monocytes: 8 %
Neutrophils Absolute: 2.7 10*3/uL (ref 1.4–7.0)
Neutrophils: 53 %
Platelets: 195 10*3/uL (ref 150–450)
RBC: 4.9 x10E6/uL (ref 4.14–5.80)
RDW: 15 % (ref 11.6–15.4)
WBC: 5 10*3/uL (ref 3.4–10.8)

## 2020-03-23 LAB — TSH: TSH: 1.47 u[IU]/mL (ref 0.450–4.500)

## 2020-03-23 LAB — TISSUE TRANSGLUTAMINASE, IGA: Transglutaminase IgA: <2 U/mL (ref 0–3)

## 2020-03-23 LAB — IGA: IgA/Immunoglobulin A, Serum: <5 mg/dL — ABNORMAL LOW (ref 61–437)

## 2020-03-23 LAB — PSA TOTAL (REFLEX TO FREE): Prostate Specific Ag, Serum: 1 ng/mL (ref 0.0–4.0)

## 2020-03-23 NOTE — Telephone Encounter (Signed)
Copied from [place] [id number]. Topic: General - Other >> Mar 23, 2020  8:11 AM Alanda Slim E wrote: Reason for CRM: Pt would like to come by and pick up a copy of his recent lab results / please advise when ready

## 2020-03-23 NOTE — Telephone Encounter (Signed)
Lab results printed at the front desk for pickup. Patient advised.  

## 2020-06-25 ENCOUNTER — Other Ambulatory Visit: Payer: Self-pay

## 2020-06-25 ENCOUNTER — Ambulatory Visit (INDEPENDENT_AMBULATORY_CARE_PROVIDER_SITE_OTHER): Payer: Medicare Other | Admitting: Family Medicine

## 2020-06-25 ENCOUNTER — Encounter: Payer: Self-pay | Admitting: Family Medicine

## 2020-06-25 VITALS — BP 128/70 | HR 80 | Temp 98.3°F | Resp 16 | Wt 204.0 lb

## 2020-06-25 DIAGNOSIS — N401 Enlarged prostate with lower urinary tract symptoms: Secondary | ICD-10-CM

## 2020-06-25 DIAGNOSIS — N410 Acute prostatitis: Secondary | ICD-10-CM

## 2020-06-25 DIAGNOSIS — I10 Essential (primary) hypertension: Secondary | ICD-10-CM | POA: Diagnosis not present

## 2020-06-25 DIAGNOSIS — K649 Unspecified hemorrhoids: Secondary | ICD-10-CM | POA: Diagnosis not present

## 2020-06-25 DIAGNOSIS — R3911 Hesitancy of micturition: Secondary | ICD-10-CM

## 2020-06-25 MED ORDER — TAMSULOSIN HCL 0.4 MG PO CAPS: 0.4000 mg | ORAL_CAPSULE | Freq: Every day | ORAL | 4 refills | Status: DC

## 2020-06-25 MED ORDER — HYDROCORTISONE (PERIANAL) 2.5 % EX CREA: TOPICAL_CREAM | CUTANEOUS | 5 refills | Status: DC

## 2020-07-12 DIAGNOSIS — D2262 Melanocytic nevi of left upper limb, including shoulder: Secondary | ICD-10-CM | POA: Diagnosis not present

## 2020-07-12 DIAGNOSIS — Z85820 Personal history of malignant melanoma of skin: Secondary | ICD-10-CM | POA: Diagnosis not present

## 2020-07-12 DIAGNOSIS — D2272 Melanocytic nevi of left lower limb, including hip: Secondary | ICD-10-CM | POA: Diagnosis not present

## 2020-07-12 DIAGNOSIS — D0471 Carcinoma in situ of skin of right lower limb, including hip: Secondary | ICD-10-CM | POA: Diagnosis not present

## 2020-07-12 DIAGNOSIS — D485 Neoplasm of uncertain behavior of skin: Secondary | ICD-10-CM | POA: Diagnosis not present

## 2020-07-12 DIAGNOSIS — L570 Actinic keratosis: Secondary | ICD-10-CM | POA: Diagnosis not present

## 2020-07-12 DIAGNOSIS — D2271 Melanocytic nevi of right lower limb, including hip: Secondary | ICD-10-CM | POA: Diagnosis not present

## 2020-07-12 DIAGNOSIS — X32XXXA Exposure to sunlight, initial encounter: Secondary | ICD-10-CM | POA: Diagnosis not present

## 2020-07-12 DIAGNOSIS — Z85828 Personal history of other malignant neoplasm of skin: Secondary | ICD-10-CM | POA: Diagnosis not present

## 2020-07-12 DIAGNOSIS — D225 Melanocytic nevi of trunk: Secondary | ICD-10-CM | POA: Diagnosis not present

## 2020-07-30 DIAGNOSIS — Z23 Encounter for immunization: Secondary | ICD-10-CM | POA: Diagnosis not present

## 2020-08-08 DIAGNOSIS — L570 Actinic keratosis: Secondary | ICD-10-CM | POA: Diagnosis not present

## 2020-08-08 DIAGNOSIS — D0471 Carcinoma in situ of skin of right lower limb, including hip: Secondary | ICD-10-CM | POA: Diagnosis not present

## 2020-08-21 ENCOUNTER — Other Ambulatory Visit: Payer: Self-pay | Admitting: Family Medicine

## 2020-08-21 DIAGNOSIS — I10 Essential (primary) hypertension: Secondary | ICD-10-CM

## 2020-08-24 ENCOUNTER — Encounter: Payer: Medicare Other | Admitting: Family Medicine

## 2020-08-27 DIAGNOSIS — Z23 Encounter for immunization: Secondary | ICD-10-CM | POA: Diagnosis not present

## 2020-10-16 ENCOUNTER — Emergency Department: Payer: Medicare Other

## 2020-10-16 ENCOUNTER — Other Ambulatory Visit: Payer: Self-pay

## 2020-10-16 ENCOUNTER — Telehealth (INDEPENDENT_AMBULATORY_CARE_PROVIDER_SITE_OTHER): Payer: Medicare Other | Admitting: Family Medicine

## 2020-10-16 ENCOUNTER — Encounter: Payer: Self-pay | Admitting: Family Medicine

## 2020-10-16 ENCOUNTER — Inpatient Hospital Stay
Admission: EM | Admit: 2020-10-16 | Discharge: 2020-10-21 | DRG: 389 | Disposition: A | Discharge status: Home / Self Care | Payer: Medicare Other | Attending: Surgery | Admitting: Surgery

## 2020-10-16 ENCOUNTER — Encounter: Payer: Self-pay | Admitting: Emergency Medicine

## 2020-10-16 VITALS — Temp 98.8°F

## 2020-10-16 DIAGNOSIS — K5792 Diverticulitis of intestine, part unspecified, without perforation or abscess without bleeding: Secondary | ICD-10-CM | POA: Diagnosis not present

## 2020-10-16 DIAGNOSIS — K913 Postprocedural intestinal obstruction, unspecified as to partial versus complete: Secondary | ICD-10-CM | POA: Diagnosis not present

## 2020-10-16 DIAGNOSIS — Z6832 Body mass index (BMI) 32.0-32.9, adult: Secondary | ICD-10-CM | POA: Diagnosis not present

## 2020-10-16 DIAGNOSIS — R1013 Epigastric pain: Secondary | ICD-10-CM

## 2020-10-16 DIAGNOSIS — Z8249 Family history of ischemic heart disease and other diseases of the circulatory system: Secondary | ICD-10-CM

## 2020-10-16 DIAGNOSIS — E039 Hypothyroidism, unspecified: Secondary | ICD-10-CM | POA: Diagnosis present

## 2020-10-16 DIAGNOSIS — R109 Unspecified abdominal pain: Secondary | ICD-10-CM | POA: Diagnosis not present

## 2020-10-16 DIAGNOSIS — K6389 Other specified diseases of intestine: Secondary | ICD-10-CM | POA: Diagnosis not present

## 2020-10-16 DIAGNOSIS — Z20822 Contact with and (suspected) exposure to covid-19: Secondary | ICD-10-CM | POA: Diagnosis present

## 2020-10-16 DIAGNOSIS — K429 Umbilical hernia without obstruction or gangrene: Secondary | ICD-10-CM | POA: Diagnosis not present

## 2020-10-16 DIAGNOSIS — Z85820 Personal history of malignant melanoma of skin: Secondary | ICD-10-CM

## 2020-10-16 DIAGNOSIS — Z833 Family history of diabetes mellitus: Secondary | ICD-10-CM

## 2020-10-16 DIAGNOSIS — R102 Pelvic and perineal pain: Secondary | ICD-10-CM | POA: Diagnosis not present

## 2020-10-16 DIAGNOSIS — E876 Hypokalemia: Secondary | ICD-10-CM | POA: Diagnosis present

## 2020-10-16 DIAGNOSIS — Z8042 Family history of malignant neoplasm of prostate: Secondary | ICD-10-CM | POA: Diagnosis not present

## 2020-10-16 DIAGNOSIS — E785 Hyperlipidemia, unspecified: Secondary | ICD-10-CM | POA: Diagnosis present

## 2020-10-16 DIAGNOSIS — K567 Ileus, unspecified: Secondary | ICD-10-CM

## 2020-10-16 DIAGNOSIS — Z79890 Hormone replacement therapy (postmenopausal): Secondary | ICD-10-CM | POA: Diagnosis not present

## 2020-10-16 DIAGNOSIS — K56699 Other intestinal obstruction unspecified as to partial versus complete obstruction: Secondary | ICD-10-CM | POA: Diagnosis not present

## 2020-10-16 DIAGNOSIS — E669 Obesity, unspecified: Secondary | ICD-10-CM | POA: Diagnosis present

## 2020-10-16 DIAGNOSIS — K5732 Diverticulitis of large intestine without perforation or abscess without bleeding: Secondary | ICD-10-CM | POA: Diagnosis present

## 2020-10-16 DIAGNOSIS — K56609 Unspecified intestinal obstruction, unspecified as to partial versus complete obstruction: Secondary | ICD-10-CM | POA: Diagnosis present

## 2020-10-16 DIAGNOSIS — R933 Abnormal findings on diagnostic imaging of other parts of digestive tract: Secondary | ICD-10-CM | POA: Diagnosis not present

## 2020-10-16 DIAGNOSIS — I10 Essential (primary) hypertension: Secondary | ICD-10-CM | POA: Diagnosis not present

## 2020-10-16 DIAGNOSIS — K449 Diaphragmatic hernia without obstruction or gangrene: Secondary | ICD-10-CM | POA: Diagnosis not present

## 2020-10-16 DIAGNOSIS — K219 Gastro-esophageal reflux disease without esophagitis: Secondary | ICD-10-CM | POA: Diagnosis present

## 2020-10-16 DIAGNOSIS — Z978 Presence of other specified devices: Secondary | ICD-10-CM | POA: Diagnosis not present

## 2020-10-16 DIAGNOSIS — Z4682 Encounter for fitting and adjustment of non-vascular catheter: Secondary | ICD-10-CM | POA: Diagnosis not present

## 2020-10-16 DIAGNOSIS — K5939 Other megacolon: Secondary | ICD-10-CM | POA: Diagnosis not present

## 2020-10-16 DIAGNOSIS — R1900 Intra-abdominal and pelvic swelling, mass and lump, unspecified site: Secondary | ICD-10-CM | POA: Diagnosis not present

## 2020-10-16 DIAGNOSIS — Z79899 Other long term (current) drug therapy: Secondary | ICD-10-CM | POA: Diagnosis not present

## 2020-10-16 HISTORY — DX: Unspecified intestinal obstruction, unspecified as to partial versus complete obstruction: K56.609

## 2020-10-16 LAB — COMPREHENSIVE METABOLIC PANEL
ALT: 16 U/L (ref 0–44)
AST: 23 U/L (ref 15–41)
Albumin: 4.1 g/dL (ref 3.5–5.0)
Alkaline Phosphatase: 68 U/L (ref 38–126)
Anion gap: 14 (ref 5–15)
BUN: 20 mg/dL (ref 8–23)
CO2: 22 mmol/L (ref 22–32)
Calcium: 9.3 mg/dL (ref 8.9–10.3)
Chloride: 103 mmol/L (ref 98–111)
Creatinine, Ser: 1.31 mg/dL — ABNORMAL HIGH (ref 0.61–1.24)
GFR, Estimated: 57 mL/min — ABNORMAL LOW (ref >60)
Glucose, Bld: 144 mg/dL — ABNORMAL HIGH (ref 70–99)
Potassium: 3.2 mmol/L — ABNORMAL LOW (ref 3.5–5.1)
Sodium: 139 mmol/L (ref 135–145)
Total Bilirubin: 1.1 mg/dL (ref 0.3–1.2)
Total Protein: 9.2 g/dL — ABNORMAL HIGH (ref 6.5–8.1)

## 2020-10-16 LAB — CBC
HCT: 40.5 % (ref 39.0–52.0)
Hemoglobin: 13.8 g/dL (ref 13.0–17.0)
MCH: 29 pg (ref 26.0–34.0)
MCHC: 34.1 g/dL (ref 30.0–36.0)
MCV: 85.1 fL (ref 80.0–100.0)
Platelets: 349 10*3/uL (ref 150–400)
RBC: 4.76 MIL/uL (ref 4.22–5.81)
RDW: 13.5 % (ref 11.5–15.5)
WBC: 9.9 10*3/uL (ref 4.0–10.5)
nRBC: 0 % (ref 0.0–0.2)

## 2020-10-16 LAB — RESP PANEL BY RT-PCR (FLU A&B, COVID) ARPGX2
Influenza A by PCR: NEGATIVE
Influenza B by PCR: NEGATIVE
SARS Coronavirus 2 by RT PCR: NEGATIVE

## 2020-10-16 LAB — LIPASE, BLOOD: Lipase: 26 U/L (ref 11–51)

## 2020-10-16 LAB — TROPONIN I (HIGH SENSITIVITY): Troponin I (High Sensitivity): 8 ng/L (ref <18)

## 2020-10-16 MED ORDER — ACETAMINOPHEN 500 MG PO TABS
1000.0000 mg | ORAL_TABLET | Freq: Four times a day (QID) | ORAL | Status: DC
  Administered 2020-10-18 – 2020-10-19 (×7): 1000 mg via ORAL
  Filled 2020-10-16 (×8): qty 2

## 2020-10-16 MED ORDER — SODIUM CHLORIDE 0.9 % IV SOLN
3.0000 g | Freq: Four times a day (QID) | INTRAVENOUS | Status: DC
  Administered 2020-10-17 (×2): 3 g via INTRAVENOUS
  Filled 2020-10-16 (×2): qty 8

## 2020-10-16 MED ORDER — MORPHINE SULFATE (PF) 4 MG/ML IV SOLN
4.0000 mg | Freq: Once | INTRAVENOUS | Status: AC
  Administered 2020-10-16: 4 mg via INTRAVENOUS
  Filled 2020-10-16: qty 1

## 2020-10-16 MED ORDER — ENOXAPARIN SODIUM 60 MG/0.6ML ~~LOC~~ SOLN
0.5000 mg/kg | SUBCUTANEOUS | Status: DC
  Administered 2020-10-17 – 2020-10-20 (×5): 47.5 mg via SUBCUTANEOUS
  Filled 2020-10-16 (×6): qty 0.6

## 2020-10-16 MED ORDER — SODIUM CHLORIDE 0.9 % IV SOLN: INTRAVENOUS | Status: DC

## 2020-10-16 MED ORDER — METOPROLOL TARTRATE 5 MG/5ML IV SOLN: 5.0000 mg | Freq: Four times a day (QID) | INTRAVENOUS | Status: DC | PRN

## 2020-10-16 MED ORDER — ONDANSETRON HCL 4 MG/2ML IJ SOLN: 4.0000 mg | Freq: Four times a day (QID) | INTRAMUSCULAR | Status: DC | PRN

## 2020-10-16 MED ORDER — ONDANSETRON HCL 4 MG/2ML IJ SOLN
4.0000 mg | Freq: Once | INTRAMUSCULAR | Status: AC
  Administered 2020-10-16: 4 mg via INTRAVENOUS
  Filled 2020-10-16: qty 2

## 2020-10-16 MED ORDER — SODIUM CHLORIDE 0.9 % IV BOLUS
1000.0000 mL | Freq: Once | INTRAVENOUS | Status: AC
  Administered 2020-10-17: 1000 mL via INTRAVENOUS

## 2020-10-16 MED ORDER — KETOROLAC TROMETHAMINE 15 MG/ML IJ SOLN
15.0000 mg | Freq: Four times a day (QID) | INTRAMUSCULAR | Status: DC | PRN
  Filled 2020-10-16: qty 1

## 2020-10-16 MED ORDER — IOHEXOL 300 MG/ML  SOLN
100.0000 mL | Freq: Once | INTRAMUSCULAR | Status: AC | PRN
  Administered 2020-10-16: 100 mL via INTRAVENOUS

## 2020-10-16 MED ORDER — SODIUM CHLORIDE 0.9 % IV BOLUS
1000.0000 mL | Freq: Once | INTRAVENOUS | Status: AC
  Administered 2020-10-16: 1000 mL via INTRAVENOUS

## 2020-10-16 MED ORDER — PROCHLORPERAZINE MALEATE 10 MG PO TABS
10.0000 mg | ORAL_TABLET | Freq: Four times a day (QID) | ORAL | Status: DC | PRN
  Filled 2020-10-16: qty 1

## 2020-10-16 MED ORDER — ENOXAPARIN SODIUM 40 MG/0.4ML ~~LOC~~ SOLN: 40.0000 mg | SUBCUTANEOUS | Status: DC

## 2020-10-16 MED ORDER — ONDANSETRON 4 MG PO TBDP: 4.0000 mg | ORAL_TABLET | Freq: Four times a day (QID) | ORAL | Status: DC | PRN

## 2020-10-16 MED ORDER — MORPHINE SULFATE (PF) 2 MG/ML IV SOLN: 2.0000 mg | INTRAVENOUS | Status: DC | PRN

## 2020-10-16 MED ORDER — PANTOPRAZOLE SODIUM 40 MG IV SOLR
40.0000 mg | Freq: Two times a day (BID) | INTRAVENOUS | Status: DC
  Administered 2020-10-17 – 2020-10-21 (×10): 40 mg via INTRAVENOUS
  Filled 2020-10-16 (×10): qty 40

## 2020-10-16 MED ORDER — PROCHLORPERAZINE EDISYLATE 10 MG/2ML IJ SOLN: 5.0000 mg | Freq: Four times a day (QID) | INTRAMUSCULAR | Status: DC | PRN

## 2020-10-16 NOTE — H&P (Signed)
Patient ID: Christian Mora, male   DOB: 1946-04-15, 74 y.o.   MRN: [id number]  HPI TRAY Christian Mora is a 74 y.o. male present to emergency room with abdominal pain related to an umbilical hernia.  He also reports nausea,   Pain was severe around the umbilicus and sharp in nature worsening with Valsalva.  No fevers no chills.  Nurses that the pain started about 3 days ago.  He also reports watery stools the past.  No bowel movements or gas today.  The scan was personally reviewed there is evidence of dilated loops of small bowel.  There is questionable colitis likely attributed to diverticulum of the right side.  There is no evidence of any collections.  Evidence of umbilical hernia and there is questionable transition area within the defect.  No evidence of pneumatosis no evidence of ischemic bowel.  He does have evidence of a large hiatal hernia. In the ER Dr. Archie Balboa try to reduce it and place an NG tube and then the hernia reduced spontaneously.  Laboratory values include potassium of 3.2 creatinine of 1.3 this is up from a baseline creatinine of 1.  CBC is normal.  The rest of the labs are completely normal including normal LFTs. He is able to perform more than 4 METS of activity without any shortness of breath or chest pain.  Denies any strokes or any heart attacks.  He also denies any major abdominal interventions.  He did have a history of melanoma in the right arm requiring axillary node dissection.  Last colonoscopy was 2 years ago.  He also have a capsule endoscopy showing evidence of bleeding diverticulosis within the small bowel.   HPI  Past Medical History:  Diagnosis Date  . Allergy   . Anemia   . Anxiety   . GERD (gastroesophageal reflux disease)   . Hyperlipidemia   . Hypertension   . Hypothyroidism   . Melanoma of skin ([place]) 03/13/2003  . Thyroid disease     Past Surgical History:  Procedure Laterality Date  . COLONOSCOPY WITH PROPOFOL N/A 03/03/2018   Procedure: COLONOSCOPY WITH  PROPOFOL;  Surgeon: Jonathon Bellows, MD;  Location: [facility] ENDOSCOPY;  Service: Gastroenterology;  Laterality: N/A;  . ESOPHAGOGASTRODUODENOSCOPY (EGD) WITH PROPOFOL N/A 03/03/2018   Procedure: ESOPHAGOGASTRODUODENOSCOPY (EGD) WITH PROPOFOL;  Surgeon: Jonathon Bellows, MD;  Location: [facility] ENDOSCOPY;  Service: Gastroenterology;  Laterality: N/A;  . GIVENS CAPSULE STUDY N/A 03/12/2018   Procedure: GIVENS CAPSULE STUDY;  Surgeon: Jonathon Bellows, MD;  Location: [facility] ENDOSCOPY;  Service: Gastroenterology;  Laterality: N/A;  . lymph node removed  2004  . TONSILLECTOMY AND ADENOIDECTOMY  1954    Family History  Problem Relation Age of Onset  . Hypertension Mother   . Cancer Father        prostate  . Diabetes Son     Social History Social History   Tobacco Use  . Smoking status: Never Smoker  . Smokeless tobacco: Never Used  Vaping Use  . Vaping Use: Never used  Substance Use Topics  . Alcohol use: No  . Drug use: No    No Known Allergies  Current Facility-Administered Medications  Medication Dose Route Frequency Provider Last Rate Last Admin  . 0.9 %  sodium chloride infusion   Intravenous Continuous Gigi Onstad F, MD      . Derrill Memo ON 10/17/2020] acetaminophen (TYLENOL) tablet 1,000 mg  1,000 mg Oral Q6H Venba Zenner F, MD      . Ampicillin-Sulbactam (UNASYN) 3 g in  sodium chloride 0.9 % 100 mL IVPB  3 g Intravenous Q6H Angline Schweigert F, MD      . enoxaparin (LOVENOX) injection 40 mg  40 mg Subcutaneous Q24H Izeah Vossler F, MD      . ketorolac (TORADOL) 15 MG/ML injection 15 mg  15 mg Intravenous Q6H PRN Kavaughn Faucett F, MD      . metoprolol tartrate (LOPRESSOR) injection 5 mg  5 mg Intravenous Q6H PRN Dreonna Hussein F, MD      . morphine 2 MG/ML injection 2 mg  2 mg Intravenous Q2H PRN Jishnu Jenniges F, MD      . ondansetron (ZOFRAN-ODT) disintegrating tablet 4 mg  4 mg Oral Q6H PRN Khyler Urda F, MD       Or  . ondansetron (ZOFRAN) injection 4 mg  4 mg Intravenous Q6H PRN Kahil Agner F, MD      .  pantoprazole (PROTONIX) injection 40 mg  40 mg Intravenous Q12H Sylva Overley F, MD      . prochlorperazine (COMPAZINE) tablet 10 mg  10 mg Oral Q6H PRN Karsen Fellows F, MD       Or  . prochlorperazine (COMPAZINE) injection 5-10 mg  5-10 mg Intravenous Q6H PRN Zachry Hopfensperger F, MD      . sodium chloride 0.9 % bolus 1,000 mL  1,000 mL Intravenous Once Chelisa Hennen, Marjory Lies, MD       Current Outpatient Medications  Medication Sig Dispense Refill  . ALPRAZolam (XANAX) 0.5 MG tablet 1/2 TO 1 TABLET BY MOUTH 3 TIMES DAILY AS NEEDED 60 tablet 5  . amLODipine (NORVASC) 10 MG tablet TAKE 1 TABLET BY MOUTH DAILY 90 tablet 4  . augmented betamethasone dipropionate (DIPROLENE-AF) 0.05 % cream APPLY TO AFFECTED AREAS TWICE DAILY AS NEEDED    . diclofenac sodium (VOLTAREN) 1 % GEL Apply 4 g topically 4 (four) times daily as needed. 100 g 1  . fluticasone (FLONASE) 50 MCG/ACT nasal spray SPRAY TWICE INTO EACH NOSTRIL ONCE DAILY 16 g 3  . hydrocortisone (ANUSOL-HC) 2.5 % rectal cream APPLY TO AFFECTED AREAS 2 OR 3 TIMES DAILY AS NEEDED 30 g 5  . ibuprofen (ADVIL,MOTRIN) 200 MG tablet Take 200 mg by mouth every 6 (six) hours as needed.    Marland Kitchen levothyroxine (SYNTHROID) 137 MCG tablet TAKE 1 TABLET BY MOUTH DAILY BEFORE BREAKFAST 90 tablet 4  . MULTIPLE VITAMIN PO Take 1 tablet by mouth every other day.     . Multiple Vitamins-Iron (MULTIVITAMIN/IRON PO) Take 1 tablet by mouth every other day.    Marland Kitchen omeprazole (PRILOSEC) 20 MG capsule TAKE 1 CAPSULE BY MOUTH TWICE DAILY BEFORE A MEAL 60 capsule 12  . tamsulosin (FLOMAX) 0.4 MG CAPS capsule Take 1 capsule (0.4 mg total) by mouth daily. 90 capsule 4     Review of Systems Full ROS  was asked and was negative except for the information on the HPI  Physical Exam Blood pressure 135/77, pulse 84, temperature 98.1 F (36.7 C), temperature source Oral, resp. rate 16, height 5\' 6"  (1.676 m), weight 92.5 kg, SpO2 94 %. CONSTITUTIONAL: NAD, BMI 33 EYES: Pupils are equal, round,   Sclera are non-icteric. EARS, NOSE, MOUTH AND THROAT:wearing a mask. Hearing is intact to voice. LYMPH NODES:  Lymph nodes in the neck are normal. RESPIRATORY:  Lungs are clear. There is normal respiratory effort, with equal breath sounds bilaterally, and without pathologic use of accessory muscles. CARDIOVASCULAR: Heart is regular without murmurs, gallops, or rubs. GI: The abdomen  is  soft, distended w decrease BS, reducible UH, there are no palpable masses. No peritonitis. GU: Rectal deferred.   MUSCULOSKELETAL: Normal muscle strength and tone. No cyanosis or edema.   SKIN: Turgor is good and there are no pathologic skin lesions or ulcers. NEUROLOGIC: Motor and sensation is grossly normal. Cranial nerves are grossly intact. PSYCH:  Oriented to person, place and time. Affect is normal.  Data Reviewed  I have personally reviewed the patient's imaging, laboratory findings and medical records.    Assessment/Plan 74 year old male with resolved umbilical hernia incarceration now with postoperative bowel obstruction and ileus.  Currently I am  able to reduce the hernia.  There is no peritonitis.  He does have significant dilation of the small bowel and I will treat that as an ileus.  Will admit resuscitated with IV fluids perform serial abdominal exams and serial abdominal x-rays.  Given his potential colitis from a right-sided diverticula we will go ahead and start empiric antibiotic therapy.  This also might be contributing to the degree of ileus.  No need for emergent surgical intervention at this time.  Some point time we will need to fix this hernia electively and under optimized conditions.  Caroleen Hamman, MD FACS General Surgeon 10/16/2020, 11:05 PM

## 2020-10-16 NOTE — Progress Notes (Signed)
MyChart Video Visit    Virtual Visit via Video Note   This visit type was conducted due to national recommendations for restrictions regarding the COVID-19 Pandemic (e.g. social distancing) in an effort to limit this patient's exposure and mitigate transmission in our community. This patient is at least at moderate risk for complications without adequate follow up. This format is felt to be most appropriate for this patient at this time. Physical exam was limited by quality of the video and audio technology used for the visit.   Patient location: Home Provider location: Office  I discussed the limitations of evaluation and management by telemedicine and the availability of in person appointments. The patient expressed understanding and agreed to proceed.  [name]   DOB: December 04, 1945   74 y.o. Male  MRN: [id number] Visit Date: 10/16/2020  Today's healthcare provider: Vernie Murders, PA-C   No chief complaint on file.  Subjective    HPI  Patient is a 74 year old male who presents via video visit for abdominal pain, gas, nausea and diarrhea.  He states he has history of hiatel hernia and it is in this area that he is having the pain.  He states that 2 nights (Sun) ago he had severe abdominal pain and gas and then in the early hours of the next morning he began having the diarrhea.  He had 5 loose stools in 1 hour.  He has not had any blood.  He has had nausea and dry heaves as he has not been able to eat or drink anything due to the nausea.  He did a home Covid test yesterday which was negative.  He states he had a low grade fever but after asking what the temperature was it was found to be 98.8.  Past Medical History:  Diagnosis Date  . Allergy   . Anemia   . Anxiety   . GERD (gastroesophageal reflux disease)   . Hyperlipidemia   . Hypertension   . Hypothyroidism   . Melanoma of skin ([place]) 03/13/2003  . Thyroid disease    Past Surgical History:  Procedure  Laterality Date  . COLONOSCOPY WITH PROPOFOL N/A 03/03/2018   Procedure: COLONOSCOPY WITH PROPOFOL;  Surgeon: Jonathon Bellows, MD;  Location: [facility] ENDOSCOPY;  Service: Gastroenterology;  Laterality: N/A;  . ESOPHAGOGASTRODUODENOSCOPY (EGD) WITH PROPOFOL N/A 03/03/2018   Procedure: ESOPHAGOGASTRODUODENOSCOPY (EGD) WITH PROPOFOL;  Surgeon: Jonathon Bellows, MD;  Location: [facility] ENDOSCOPY;  Service: Gastroenterology;  Laterality: N/A;  . GIVENS CAPSULE STUDY N/A 03/12/2018   Procedure: GIVENS CAPSULE STUDY;  Surgeon: Jonathon Bellows, MD;  Location: [facility] ENDOSCOPY;  Service: Gastroenterology;  Laterality: N/A;  . lymph node removed  2004  . TONSILLECTOMY AND ADENOIDECTOMY  1954   Social History   Tobacco Use  . Smoking status: Never Smoker  . Smokeless tobacco: Never Used  Vaping Use  . Vaping Use: Never used  Substance Use Topics  . Alcohol use: No  . Drug use: No   Family History  Problem Relation Age of Onset  . Hypertension Mother   . Cancer Father        prostate  . Diabetes Son    No Known Allergies    Medications: Outpatient Medications Prior to Visit  Medication Sig  . ALPRAZolam (XANAX) 0.5 MG tablet 1/2 TO 1 TABLET BY MOUTH 3 TIMES DAILY AS NEEDED  . amLODipine (NORVASC) 10 MG tablet TAKE 1 TABLET BY MOUTH DAILY  . augmented betamethasone dipropionate (DIPROLENE-AF) 0.05 % cream  APPLY TO AFFECTED AREAS TWICE DAILY AS NEEDED  . diclofenac sodium (VOLTAREN) 1 % GEL Apply 4 g topically 4 (four) times daily as needed.  . fluticasone (FLONASE) 50 MCG/ACT nasal spray SPRAY TWICE INTO EACH NOSTRIL ONCE DAILY  . hydrocortisone (ANUSOL-HC) 2.5 % rectal cream APPLY TO AFFECTED AREAS 2 OR 3 TIMES DAILY AS NEEDED  . ibuprofen (ADVIL,MOTRIN) 200 MG tablet Take 200 mg by mouth every 6 (six) hours as needed.  Marland Kitchen levothyroxine (SYNTHROID) 137 MCG tablet TAKE 1 TABLET BY MOUTH DAILY BEFORE BREAKFAST  . MULTIPLE VITAMIN PO Take 1 tablet by mouth every other day.   . Multiple Vitamins-Iron  (MULTIVITAMIN/IRON PO) Take 1 tablet by mouth every other day.  Marland Kitchen omeprazole (PRILOSEC) 20 MG capsule TAKE 1 CAPSULE BY MOUTH TWICE DAILY BEFORE A MEAL  . tamsulosin (FLOMAX) 0.4 MG CAPS capsule Take 1 capsule (0.4 mg total) by mouth daily.   No facility-administered medications prior to visit.    Review of Systems  Gastrointestinal: Positive for abdominal distention, abdominal pain, diarrhea, nausea and vomiting (dry heaves).      Objective    Temp 98.8 F (37.1 C)    Physical Exam: WDWN male in acute abdominal pain distress.  Head: Normocephalic, atraumatic. Neck: Supple, NROM Respiratory: No apparent distress Psych: Normal mood and affect     Assessment & Plan     1. Abdominal pain, acute, epigastric Having acute epigastric pain since Sunday with nausea, gas and some diarrhea. Has not eaten due to severe nausea and pain today. Denies hematemesis or melena. Advised to go to the ER for acute abdomen work up. May have GB stone.   No follow-ups on file.     I discussed the assessment and treatment plan with the patient. The patient was provided an opportunity to ask questions and all were answered. The patient agreed with the plan and demonstrated an understanding of the instructions.   The patient was advised to call back or seek an in-person evaluation if the symptoms worsen or if the condition fails to improve as anticipated.  I provided 10 minutes of non-face-to-face time during this encounter.  I, Cuca Benassi, PA-C, have reviewed all documentation for this visit. The documentation on 10/16/20 for the exam, diagnosis, procedures, and orders are all accurate and complete.   Vernie Murders, PA-C [employer] [phone] (phone) [phone] (fax)  [place]

## 2020-10-16 NOTE — Progress Notes (Signed)
PHARMACIST - PHYSICIAN COMMUNICATION  CONCERNING:  Enoxaparin (Lovenox) for DVT Prophylaxis    RECOMMENDATION: Patient was prescribed enoxaprin 40mg  q24 hours for VTE prophylaxis.   Filed Weights   10/16/20 1427  Weight: 92.5 kg (203 lb 14.8 oz)    Body mass index is 32.91 kg/m.  Estimated Creatinine Clearance: 52.7 mL/min (A) (by C-G formula based on SCr of 1.31 mg/dL (H)).   Based on [place] patient is candidate for enoxaparin 0.5mg /kg TBW SQ every 24 hours based on BMI being >30.  DESCRIPTION: Pharmacy has adjusted enoxaparin dose per [facility] policy.  Patient is now receiving enoxaparin 0.5 mg/kg every 24 hours   Renda Rolls, PharmD, [facility] 10/16/2020 11:17 PM

## 2020-10-16 NOTE — ED Triage Notes (Signed)
C/O upper abdominal pain since Sunday.  Reports loose stools on Monday x 5.  Also states feels gassey.  Belching frequently.  Decreased appetite.  AAOx3. Skin warm and dry. NAD

## 2020-10-16 NOTE — ED Provider Notes (Signed)
[facility] Emergency Department Provider Note   ____________________________________________   I have reviewed the triage vital signs and the nursing notes.   HISTORY  Chief Complaint Abdominal Pain and Nausea   History limited by: Not Limited   HPI Christian Mora is a 74 y.o. male who presents to the emergency department today because of concerns for abdominal pain.  The pain started 2 days ago.  He states that there is pain throughout his abdomen but is worse in the epigastric region.  He has also noticed abdominal distention.  When the symptoms first started he had a couple episodes of diarrhea although has not had any bowel movement or passed gas today.  The patient has had some nausea with this.  He denies similar symptoms in the past.  Denies any fevers.   Records reviewed. Per medical record review patient has a history of HLD, HTN  Past Medical History:  Diagnosis Date  . Allergy   . Anemia   . Anxiety   . GERD (gastroesophageal reflux disease)   . Hyperlipidemia   . Hypertension   . Hypothyroidism   . Melanoma of skin ([place]) 03/13/2003  . Thyroid disease     Patient Active Problem List   Diagnosis Date Noted  . History of adenomatous polyp of colon 03/09/2018  . Iron deficiency anemia 01/26/2018  . Urinary hesitancy 02/25/2017  . Fatty infiltration of liver 07/30/2015  . 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 Essential (primary) hypertension 02/18/2010  . Allergic rhinitis 02/22/2009  . BPH (benign prostatic hyperplasia) 10/06/2007  . HLD (hyperlipidemia) 02/23/2007  . Anxiety 12/15/2006  . Acid reflux 11/04/2003  . Adult hypothyroidism 07/03/1989    Past Surgical History:  Procedure Laterality Date  . COLONOSCOPY WITH PROPOFOL N/A 03/03/2018   Procedure: COLONOSCOPY WITH PROPOFOL;  Surgeon: Jonathon Bellows, MD;  Location: [facility] ENDOSCOPY;  Service: Gastroenterology;  Laterality: N/A;  . ESOPHAGOGASTRODUODENOSCOPY (EGD) WITH PROPOFOL N/A 03/03/2018    Procedure: ESOPHAGOGASTRODUODENOSCOPY (EGD) WITH PROPOFOL;  Surgeon: Jonathon Bellows, MD;  Location: [facility] ENDOSCOPY;  Service: Gastroenterology;  Laterality: N/A;  . GIVENS CAPSULE STUDY N/A 03/12/2018   Procedure: GIVENS CAPSULE STUDY;  Surgeon: Jonathon Bellows, MD;  Location: [facility] ENDOSCOPY;  Service: Gastroenterology;  Laterality: N/A;  . lymph node removed  2004  . [place]    Prior to Admission medications   Medication Sig Start Date End Date Taking? Authorizing Provider  ALPRAZolam Duanne Moron) 0.5 MG tablet 1/2 TO 1 TABLET BY MOUTH 3 TIMES DAILY AS NEEDED 03/21/20   Birdie Sons, MD  amLODipine (NORVASC) 10 MG tablet TAKE 1 TABLET BY MOUTH DAILY 08/21/20   Birdie Sons, MD  augmented betamethasone dipropionate (DIPROLENE-AF) 0.05 % cream APPLY TO AFFECTED AREAS TWICE DAILY AS NEEDED 09/02/19   [provider]  diclofenac sodium (VOLTAREN) 1 % GEL Apply 4 g topically 4 (four) times daily as needed. 11/08/18   Birdie Sons, MD  fluticasone [employer]) 50 MCG/ACT nasal spray SPRAY TWICE INTO EACH NOSTRIL ONCE DAILY 03/21/20   Birdie Sons, MD  hydrocortisone (ANUSOL-HC) 2.5 % rectal cream APPLY TO AFFECTED AREAS 2 OR 3 TIMES DAILY AS NEEDED 06/25/20   Birdie Sons, MD  ibuprofen (ADVIL,MOTRIN) 200 MG tablet Take 200 mg by mouth every 6 (six) hours as needed.    [provider]  levothyroxine (SYNTHROID) 137 MCG tablet TAKE 1 TABLET BY MOUTH DAILY BEFORE BREAKFAST 03/22/20   Birdie Sons, MD  MULTIPLE VITAMIN PO Take  1 tablet by mouth every other day.     [provider]  Multiple Vitamins-Iron (MULTIVITAMIN/IRON PO) Take 1 tablet by mouth every other day.    [provider]  omeprazole (PRILOSEC) 20 MG capsule TAKE 1 CAPSULE BY MOUTH TWICE DAILY BEFORE A MEAL 11/26/19   Birdie Sons, MD  tamsulosin (FLOMAX) 0.4 MG CAPS capsule Take 1 capsule (0.4 mg total) by mouth daily. 06/25/20   Birdie Sons, MD     Allergies Patient has no known allergies.  Family History  Problem Relation Age of Onset  . Hypertension Mother   . Cancer Father        prostate  . Diabetes Son     Social History Social History   Tobacco Use  . Smoking status: Never Smoker  . Smokeless tobacco: Never Used  Vaping Use  . Vaping Use: Never used  Substance Use Topics  . Alcohol use: No  . Drug use: No    Review of Systems Constitutional: No fever/chills Eyes: No visual changes. ENT: No sore throat. Cardiovascular: Denies chest pain. Respiratory: Denies shortness of breath. Gastrointestinal: Positive for abdominal pain. Positive for distention. Genitourinary: Negative for dysuria. Musculoskeletal: Negative for back pain. Skin: Negative for rash. Neurological: Negative for headaches, focal weakness or numbness.  ____________________________________________   PHYSICAL EXAM:  VITAL SIGNS: ED Triage Vitals  Enc Vitals Group     BP 10/16/20 1427 (!) 142/85     Pulse Rate 10/16/20 1427 95     Resp 10/16/20 1427 16     Temp 10/16/20 1427 98.1 F (36.7 C)     Temp Source 10/16/20 1427 Oral     SpO2 10/16/20 1427 96 %     Weight 10/16/20 1427 203 lb 14.8 oz (92.5 kg)     Height 10/16/20 1427 5\' 6"  (1.676 m)     Head Circumference --      Peak Flow --      Pain Score 10/16/20 1426 7   Constitutional: Alert and oriented.  Eyes: Conjunctivae are normal.  ENT      Head: Normocephalic and atraumatic.      Nose: No congestion/rhinnorhea.      Mouth/Throat: Mucous membranes are moist.      Neck: No stridor. Hematological/Lymphatic/Immunilogical: No cervical lymphadenopathy. Cardiovascular: Normal rate, regular rhythm.  No murmurs, rubs, or gallops.  Respiratory: Normal respiratory effort without tachypnea nor retractions. Breath sounds are clear and equal bilaterally. No wheezes/rales/rhonchi. Gastrointestinal: Distended. Somewhat diffusely tender. Tympanitic.  Genitourinary:  Deferred Musculoskeletal: Normal range of motion in all extremities. No lower extremity edema. Neurologic:  Normal speech and language. No gross focal neurologic deficits are appreciated.  Skin:  Skin is warm, dry and intact. No rash noted. Psychiatric: Mood and affect are normal. Speech and behavior are normal. Patient exhibits appropriate insight and judgment.  ____________________________________________    LABS (pertinent positives/negatives)  Trop hs 8 Lipase 26 CBC wbc 9.9, hgb 13.8, plt 349 CMP na 139, k 3.2, glu 155, cr 1.31  ____________________________________________   EKG  I, Nance Pear, attending physician, personally viewed and interpreted this EKG  EKG Time: 1812 Rate: 94 Rhythm: sinus rhythm with pac Axis: normal Intervals: qtc 437 QRS: narrow, q waves II, III, avf ST changes: no st elevation Impression: abnormal ekg   ____________________________________________    RADIOLOGY  CT abd/pel SBO with transition point related to umbilical hernia. Inflammatory process in right lower quadrant concerning for possible appendicitis vs diverticulitis  ____________________________________________   PROCEDURES  Procedures  ____________________________________________   INITIAL IMPRESSION / ASSESSMENT AND PLAN / ED COURSE  Pertinent labs & imaging results that were available during my care of the patient were reviewed by me and considered in my medical decision making (see chart for details).   Patient presented to the emergency department today because of concerns for abdominal pain.  On exam patient had significantly distended and tympanic abdomen.  Had CT scan which was concerning for small bowel obstruction.  Did try to reduce hernia at bedside but was unable to.  NG tube was placed and patient did feel relief.  Dr. Dahlia Byes with surgery came to evaluate the patient.  Will plan on admission.  ____________________________________________   FINAL  CLINICAL IMPRESSION(S) / ED DIAGNOSES  Final diagnoses:  SBO (small bowel obstruction) ([place])  Abdominal pain, unspecified abdominal location     Note: This dictation was prepared with Dragon dictation. Any transcriptional errors that result from this process are unintentional     Nance Pear, MD 10/16/20 [phone]

## 2020-10-17 ENCOUNTER — Other Ambulatory Visit: Payer: Self-pay

## 2020-10-17 ENCOUNTER — Inpatient Hospital Stay: Payer: Medicare Other

## 2020-10-17 DIAGNOSIS — R933 Abnormal findings on diagnostic imaging of other parts of digestive tract: Secondary | ICD-10-CM

## 2020-10-17 DIAGNOSIS — K56609 Unspecified intestinal obstruction, unspecified as to partial versus complete obstruction: Secondary | ICD-10-CM

## 2020-10-17 LAB — URINALYSIS, COMPLETE (UACMP) WITH MICROSCOPIC
Bacteria, UA: NONE SEEN
Bilirubin Urine: NEGATIVE
Glucose, UA: NEGATIVE mg/dL
Hgb urine dipstick: NEGATIVE
Ketones, ur: 5 mg/dL — AB
Leukocytes,Ua: NEGATIVE
Nitrite: NEGATIVE
Protein, ur: NEGATIVE mg/dL
Specific Gravity, Urine: 1.034 — ABNORMAL HIGH (ref 1.005–1.030)
Squamous Epithelial / HPF: NONE SEEN (ref 0–5)
pH: 5 (ref 5.0–8.0)

## 2020-10-17 LAB — CBC
HCT: 35.9 % — ABNORMAL LOW (ref 39.0–52.0)
Hemoglobin: 12.4 g/dL — ABNORMAL LOW (ref 13.0–17.0)
MCH: 29.2 pg (ref 26.0–34.0)
MCHC: 34.5 g/dL (ref 30.0–36.0)
MCV: 84.7 fL (ref 80.0–100.0)
Platelets: 281 10*3/uL (ref 150–400)
RBC: 4.24 MIL/uL (ref 4.22–5.81)
RDW: 13.7 % (ref 11.5–15.5)
WBC: 6.6 10*3/uL (ref 4.0–10.5)
nRBC: 0 % (ref 0.0–0.2)

## 2020-10-17 LAB — COMPREHENSIVE METABOLIC PANEL
ALT: 13 U/L (ref 0–44)
AST: 24 U/L (ref 15–41)
Albumin: 3.4 g/dL — ABNORMAL LOW (ref 3.5–5.0)
Alkaline Phosphatase: 55 U/L (ref 38–126)
Anion gap: 12 (ref 5–15)
BUN: 20 mg/dL (ref 8–23)
CO2: 22 mmol/L (ref 22–32)
Calcium: 8.2 mg/dL — ABNORMAL LOW (ref 8.9–10.3)
Chloride: 107 mmol/L (ref 98–111)
Creatinine, Ser: 1.11 mg/dL (ref 0.61–1.24)
GFR, Estimated: >60 mL/min (ref >60)
Glucose, Bld: 115 mg/dL — ABNORMAL HIGH (ref 70–99)
Potassium: 3.1 mmol/L — ABNORMAL LOW (ref 3.5–5.1)
Sodium: 141 mmol/L (ref 135–145)
Total Bilirubin: 1 mg/dL (ref 0.3–1.2)
Total Protein: 7.6 g/dL (ref 6.5–8.1)

## 2020-10-17 LAB — PHOSPHORUS: Phosphorus: 4.3 mg/dL (ref 2.5–4.6)

## 2020-10-17 LAB — MAGNESIUM: Magnesium: 1.8 mg/dL (ref 1.7–2.4)

## 2020-10-17 MED ORDER — LORAZEPAM 2 MG/ML IJ SOLN
2.0000 mg | Freq: Four times a day (QID) | INTRAMUSCULAR | Status: DC | PRN
  Administered 2020-10-17 – 2020-10-19 (×3): 2 mg via INTRAVENOUS
  Filled 2020-10-17 (×3): qty 1

## 2020-10-17 MED ORDER — POTASSIUM CHLORIDE 10 MEQ/100ML IV SOLN
10.0000 meq | INTRAVENOUS | Status: AC
  Administered 2020-10-17 (×4): 10 meq via INTRAVENOUS
  Filled 2020-10-17 (×4): qty 100

## 2020-10-17 MED ORDER — PIPERACILLIN-TAZOBACTAM 3.375 G IVPB
3.3750 g | Freq: Three times a day (TID) | INTRAVENOUS | Status: DC
  Administered 2020-10-17 – 2020-10-21 (×11): 3.375 g via INTRAVENOUS
  Filled 2020-10-17 (×13): qty 50

## 2020-10-17 NOTE — Consult Note (Signed)
Pharmacy Antibiotic Note  Christian Mora is a 74 y.o. male admitted on 10/16/2020 with intra-abdominal infection (possible right-sided colitis vs diverticulitis).    Pharmacy has been consulted for Zosyn dosing.  Plan: Zosyn 3.375g IV q8h (4 hour infusion).  Height: 5\' 6"  (167.6 cm) Weight: 92.5 kg (203 lb 14.8 oz) IBW/kg (Calculated) : 63.8  Temp (24hrs), Avg:98.5 F (36.9 C), Min:98.1 F (36.7 C), Max:98.8 F (37.1 C)  Recent Labs  Lab 10/16/20 1431 10/17/20 0506  WBC 9.9 6.6  CREATININE 1.31* 1.11    Estimated Creatinine Clearance: 62.2 mL/min (by C-G formula based on SCr of 1.11 mg/dL).    No Known Allergies  Antimicrobials this admission: Unasyn  12/15 >> 12/15 Zosyn 12/15 >>   Dose adjustments this admission: none  Microbiology results: COVID/FLU NEG  Thank you for allowing pharmacy to be a part of this patient's care.  [place] 10/17/2020 8:39 AM

## 2020-10-17 NOTE — ED Notes (Signed)
Pt assisted to stand on the side of the bed to use urinal

## 2020-10-17 NOTE — Progress Notes (Addendum)
[facility] SURGICAL PROGRESS NOTE (cpt [phone])  Hospital Day(s): 1.   Interval History: Patient seen and examined, no acute events or new complaints overnight. Patient's biggest complaints are related to his NGT and sore throat. He still feels some abdominal discomfort but this is improved some. No fever, chills, nausea, emesis. He remains without leukocytosis with WBC 6.6K this morning. Mild hypokalemia to 3.1 o/w no significant electrolyte derangements. sCr remains normal at 1.11. KUB remains unchanged. NGT output recorded at 1000 ccs however he has been through 1 and 1/2 canisters since placement. He did report some flatus this morning.   Review of Systems:  Constitutional: denies fever, chills  HEENT: denies cough or congestion, + sore throat Respiratory: denies any shortness of breath  Cardiovascular: denies chest pain or palpitations  Gastrointestinal: + abdominal pain (improved), denied N/V, or diarrhea/and bowel function as per interval history Genitourinary: denies burning with urination or urinary frequency  Vital signs in last 24 hours: [min-max] current  Temp:  [98.1 F (36.7 C)-98.8 F (37.1 C)] 98.1 F (36.7 C) (12/14 1427) Pulse Rate:  [32-95] 79 (12/15 0600) Resp:  [14-21] 20 (12/15 0600) BP: (105-166)/(61-93) 105/83 (12/15 0600) SpO2:  [90 %-99 %] 94 % (12/15 0600) Weight:  [92.5 kg] 92.5 kg (12/14 1427)     Height: 5\' 6"  (167.6 cm) Weight: 92.5 kg BMI (Calculated): 32.93   Intake/Output last 2 shifts:  12/14 0701 - 12/15 0700 In: 2000 [IV Piggyback:2000] Out: 1975 [Urine:575; Emesis/NG output:1000]   Physical Exam:  Constitutional: alert, cooperative and no distress  HENT: normocephalic without obvious abnormality, NGT in place Eyes: PERRL, EOM's grossly intact and symmetric  Respiratory: breathing non-labored at rest  Cardiovascular: regular rate, irregularly irregular  Gastrointestinal: Obese, protuberant, RLQ soreness, appears distended, no  rebound/guarding, certainly without peritonitis. Reducible umbilical hernia Musculoskeletal: UE and LE FROM, +1 pitting edema bilaterally   Labs:  CBC Latest Ref Rng & Units 10/17/2020 10/16/2020 03/21/2020  WBC 4.0 - 10.5 K/uL 6.6 9.9 5.0  Hemoglobin 13.0 - 17.0 g/dL 12.4(L) 13.8 14.2  Hematocrit 39.0 - 52.0 % 35.9(L) 40.5 42.7  Platelets 150 - 400 K/uL 281 349 195   CMP Latest Ref Rng & Units 10/17/2020 10/16/2020 03/21/2020  Glucose 70 - 99 mg/dL 115(H) 144(H) 105(H)  BUN 8 - 23 mg/dL 20 20 14   Creatinine 0.61 - 1.24 mg/dL 1.11 1.31(H) 1.07  Sodium 135 - 145 mmol/L 141 139 138  Potassium 3.5 - 5.1 mmol/L 3.1(L) 3.2(L) 4.1  Chloride 98 - 111 mmol/L 107 103 103  CO2 22 - 32 mmol/L 22 22 22   Calcium 8.9 - 10.3 mg/dL 8.2(L) 9.3 9.0  Total Protein 6.5 - 8.1 g/dL 7.6 9.2(H) 7.8  Total Bilirubin 0.3 - 1.2 mg/dL 1.0 1.1 0.7  Alkaline Phos 38 - 126 U/L 55 68 75  AST 15 - 41 U/L 24 23 23   ALT 0 - 44 U/L 13 16 17      Imaging studies:   KUB (10/17/2020) personally reviewed with continued bowel distension, and radiologist report reviewed:  IMPRESSION: 1. Similar gaseous distention of small bowel compatible with Obstruction.   Assessment/Plan: (ICD-10's: K5.609) 74 y.o. male with reducible umbilical hernia and associated SBO vs ileus and concomitant stranding around the cecum concerning for possible right-sided colitis vs diverticulitis.    - Recommend continuing NGT decompression; LIS; monitor and record output   - NPO (okay for ice chips for comfort) + IVF resuscitation   - Continue IV Abx; switch to Zosyn   -  Monitor abdominal examination; on-going bowel function  - Pain control prn; antiemetics prn   - Replete K+; monitor   - Mobilization as tolerated  - No emergent surgical intervention  All of the above findings and recommendations were discussed with the patient, and the medical team, and all of patient's questions were answered to his expressed satisfaction.  -- Edison Simon, PA-C [facility] 10/17/2020, 7:28 AM [phone] M-F: 7am - 4pm  Pt seen and examined. I agree w Mr. Olean Ree [facility]. Hernia reducible. Partial SBO ileus w right sided diverticulitis. May need to repeat CT scan Friday am depending on clinical condition. No need for emergent intervention. Keep NGT

## 2020-10-17 NOTE — ED Notes (Signed)
Pt requesting prn ativan for anxiety

## 2020-10-17 NOTE — ED Notes (Signed)
Pt assisted to stand bedside the bed to use urinal. Pt was able to urinate. Pt given a cup of ice chips per request. Family member at bedside. Call light within reach. Pt has no further needs at this time.

## 2020-10-18 ENCOUNTER — Inpatient Hospital Stay: Payer: Medicare Other

## 2020-10-18 LAB — BASIC METABOLIC PANEL
Anion gap: 12 (ref 5–15)
Anion gap: 11 (ref 5–15)
BUN: 17 mg/dL (ref 8–23)
BUN: 15 mg/dL (ref 8–23)
CO2: 23 mmol/L (ref 22–32)
CO2: 20 mmol/L — ABNORMAL LOW (ref 22–32)
Calcium: 7.9 mg/dL — ABNORMAL LOW (ref 8.9–10.3)
Calcium: 7.9 mg/dL — ABNORMAL LOW (ref 8.9–10.3)
Chloride: 110 mmol/L (ref 98–111)
Chloride: 113 mmol/L — ABNORMAL HIGH (ref 98–111)
Creatinine, Ser: 1.1 mg/dL (ref 0.61–1.24)
Creatinine, Ser: 1.03 mg/dL (ref 0.61–1.24)
GFR, Estimated: >60 mL/min (ref >60)
GFR, Estimated: >60 mL/min (ref >60)
Glucose, Bld: 93 mg/dL (ref 70–99)
Glucose, Bld: 84 mg/dL (ref 70–99)
Potassium: 2.9 mmol/L — ABNORMAL LOW (ref 3.5–5.1)
Potassium: 3.4 mmol/L — ABNORMAL LOW (ref 3.5–5.1)
Sodium: 145 mmol/L (ref 135–145)
Sodium: 144 mmol/L (ref 135–145)

## 2020-10-18 LAB — CBC
HCT: 35.3 % — ABNORMAL LOW (ref 39.0–52.0)
Hemoglobin: 12 g/dL — ABNORMAL LOW (ref 13.0–17.0)
MCH: 29.5 pg (ref 26.0–34.0)
MCHC: 34 g/dL (ref 30.0–36.0)
MCV: 86.7 fL (ref 80.0–100.0)
Platelets: 249 10*3/uL (ref 150–400)
RBC: 4.07 MIL/uL — ABNORMAL LOW (ref 4.22–5.81)
RDW: 13.9 % (ref 11.5–15.5)
WBC: 4.9 10*3/uL (ref 4.0–10.5)
nRBC: 0 % (ref 0.0–0.2)

## 2020-10-18 LAB — PHOSPHORUS: Phosphorus: 4.2 mg/dL (ref 2.5–4.6)

## 2020-10-18 LAB — MAGNESIUM: Magnesium: 2 mg/dL (ref 1.7–2.4)

## 2020-10-18 MED ORDER — POTASSIUM CHLORIDE 10 MEQ/100ML IV SOLN
10.0000 meq | INTRAVENOUS | Status: AC
  Administered 2020-10-18 (×6): 10 meq via INTRAVENOUS
  Filled 2020-10-18: qty 100

## 2020-10-18 MED ORDER — SODIUM CHLORIDE 0.9 % IV SOLN
INTRAVENOUS | Status: DC | PRN
  Administered 2020-10-18: 250 mL via INTRAVENOUS

## 2020-10-18 NOTE — Progress Notes (Signed)
[facility] SURGICAL PROGRESS NOTE (cpt [phone])  Hospital Day(s): 2.   Interval History: Patient seen and examined, no acute events or new complaints overnight. Patient reports he overall is feeling much better this morning and his RLQ abdominal soreness is improved significantly. He denies fever, chills, nausea, emesis. He remains without leukocytosis, WBC 4.9K this morning. sCr normal at 1.10. UO unmeasured. He continues to have hypokalemia despite repletion, now 2.9. No other electrolyte derangements. NGT output recorded at 850 ccs in last 24 hours, and he does endorse significant ice chip consumption. He continues to pass flatus and had BM x3.   Review of Systems:  Constitutional: denies fever, chills  HEENT: denies cough or congestion  Respiratory: denies any shortness of breath  Cardiovascular: denies chest pain or palpitations  Gastrointestinal: + abdominal pain (improved), denied N/V, or diarrhea/and bowel function as per interval history Genitourinary: denies burning with urination or urinary frequency   Vital signs in last 24 hours: [min-max] current  Temp:  [98 F (36.7 C)-98.6 F (37 C)] 98 F (36.7 C) (12/16 0430) Pulse Rate:  [32-128] 77 (12/16 0430) Resp:  [15-26] 19 (12/16 0430) BP: (116-161)/(65-89) 128/71 (12/16 0430) SpO2:  [90 %-95 %] 93 % (12/16 0430) Weight:  [89.4 kg] 89.4 kg (12/15 2020)     Height: 5\' 6"  (167.6 cm) Weight: 89.4 kg BMI (Calculated): 31.83   Intake/Output last 2 shifts:  12/15 0701 - 12/16 0700 In: 4114.8 [I.V.:3561.1; IV Piggyback:553.7] Out: 2825 [Urine:100; Emesis/NG output:850]   Physical Exam:  Constitutional: alert, cooperative and no distress  HENT: normocephalic without obvious abnormality, NGT in place; output appears very diluted this morning consistent with ice chip consumption Eyes: PERRL, EOM's grossly intact and symmetric  Respiratory: breathing non-labored at rest  Cardiovascular: regular rate, irregularly  irregular Gastrointestinal: Obese, protuberant, RLQ soreness markedly improved, appears distended, no rebound/guarding, certainly without peritonitis. Reducible umbilical hernia Musculoskeletal: UE and LE FROM, +1 pitting edema bilaterally   Labs:  CBC Latest Ref Rng & Units 10/18/2020 10/17/2020 10/16/2020  WBC 4.0 - 10.5 K/uL 4.9 6.6 9.9  Hemoglobin 13.0 - 17.0 g/dL 12.0(L) 12.4(L) 13.8  Hematocrit 39.0 - 52.0 % 35.3(L) 35.9(L) 40.5  Platelets 150 - 400 K/uL 249 281 349   CMP Latest Ref Rng & Units 10/18/2020 10/17/2020 10/16/2020  Glucose 70 - 99 mg/dL 93 115(H) 144(H)  BUN 8 - 23 mg/dL 17 20 20   Creatinine 0.61 - 1.24 mg/dL 1.10 1.11 1.31(H)  Sodium 135 - 145 mmol/L 145 141 139  Potassium 3.5 - 5.1 mmol/L 2.9(L) 3.1(L) 3.2(L)  Chloride 98 - 111 mmol/L 110 107 103  CO2 22 - 32 mmol/L 23 22 22   Calcium 8.9 - 10.3 mg/dL 7.9(L) 8.2(L) 9.3  Total Protein 6.5 - 8.1 g/dL - 7.6 9.2(H)  Total Bilirubin 0.3 - 1.2 mg/dL - 1.0 1.1  Alkaline Phos 38 - 126 U/L - 55 68  AST 15 - 41 U/L - 24 23  ALT 0 - 44 U/L - 13 16    Imaging studies: No new pertinent imaging studies   Assessment/Plan: (ICD-10's: K44.609) 74 y.o. male with reducible umbilical hernia and associated SBO vs ileus and concomitant stranding around the cecum concerning for possible right-sided colitis vs diverticulitis.    - I will get a KUB this morning for additional objective data  - Follow KUB, I believe we should be able to do NGT clamping trial given his clinical improvement and return of bowel function   - NPO (okay for ice  chips for comfort) for now + IVF resuscitation                 - Continue IV Abx (Zosyn)              - Monitor abdominal examination; on-going bowel function             - Pain control prn; antiemetics prn              - Replete K+; monitor             - Mobilization as tolerated             - No emergent surgical intervention  All of the above findings and recommendations were discussed with  the patient, and the medical team, and all of patient's questions were answered to his expressed satisfaction.  -- Edison Simon, PA-C [facility] 10/18/2020, 7:19 AM [phone] M-F: 7am - 4pm

## 2020-10-18 NOTE — Plan of Care (Signed)

## 2020-10-18 NOTE — Progress Notes (Signed)
NG tube clamped for 4 hrs. NG residual was 53ml. Dr, Dahlia Byes has been notified. Notified as well of 6 liquid stools documented. 6 runs of potassium given.

## 2020-10-19 ENCOUNTER — Encounter: Payer: Self-pay | Admitting: Surgery

## 2020-10-19 ENCOUNTER — Inpatient Hospital Stay: Payer: Medicare Other

## 2020-10-19 LAB — BASIC METABOLIC PANEL
Anion gap: 12 (ref 5–15)
BUN: 13 mg/dL (ref 8–23)
CO2: 17 mmol/L — ABNORMAL LOW (ref 22–32)
Calcium: 7.8 mg/dL — ABNORMAL LOW (ref 8.9–10.3)
Chloride: 111 mmol/L (ref 98–111)
Creatinine, Ser: 1.03 mg/dL (ref 0.61–1.24)
GFR, Estimated: >60 mL/min (ref >60)
Glucose, Bld: 69 mg/dL — ABNORMAL LOW (ref 70–99)
Potassium: 3.3 mmol/L — ABNORMAL LOW (ref 3.5–5.1)
Sodium: 140 mmol/L (ref 135–145)

## 2020-10-19 LAB — CBC
HCT: 33 % — ABNORMAL LOW (ref 39.0–52.0)
Hemoglobin: 11 g/dL — ABNORMAL LOW (ref 13.0–17.0)
MCH: 29.3 pg (ref 26.0–34.0)
MCHC: 33.3 g/dL (ref 30.0–36.0)
MCV: 87.8 fL (ref 80.0–100.0)
Platelets: 208 10*3/uL (ref 150–400)
RBC: 3.76 MIL/uL — ABNORMAL LOW (ref 4.22–5.81)
RDW: 13.9 % (ref 11.5–15.5)
WBC: 4.8 10*3/uL (ref 4.0–10.5)
nRBC: 0 % (ref 0.0–0.2)

## 2020-10-19 MED ORDER — POTASSIUM CHLORIDE CRYS ER 20 MEQ PO TBCR
20.0000 meq | EXTENDED_RELEASE_TABLET | Freq: Two times a day (BID) | ORAL | Status: DC
  Administered 2020-10-19 – 2020-10-21 (×5): 20 meq via ORAL
  Filled 2020-10-19 (×5): qty 1

## 2020-10-19 MED ORDER — IOHEXOL 300 MG/ML  SOLN
100.0000 mL | Freq: Once | INTRAMUSCULAR | Status: AC | PRN
  Administered 2020-10-19: 100 mL via INTRAVENOUS

## 2020-10-19 MED ORDER — MENTHOL 3 MG MT LOZG
1.0000 | LOZENGE | OROMUCOSAL | Status: DC | PRN
  Filled 2020-10-19 (×2): qty 9

## 2020-10-19 MED ORDER — BOOST / RESOURCE BREEZE PO LIQD CUSTOM
1.0000 | Freq: Three times a day (TID) | ORAL | Status: DC
  Administered 2020-10-19 – 2020-10-21 (×8): 1 via ORAL

## 2020-10-19 MED ORDER — IOHEXOL 9 MG/ML PO SOLN
500.0000 mL | ORAL | Status: AC
  Administered 2020-10-19 (×2): 500 mL via ORAL

## 2020-10-19 NOTE — Plan of Care (Signed)
Continuing with plan of care. 

## 2020-10-19 NOTE — Care Management Important Message (Signed)
Important Message  Patient Details  Name: Christian Mora MRN: [id number] Date of Birth: 02/17/1946   Medicare Important Message Given:  Yes     Dannette Barbara 10/19/2020, 12:05 PM

## 2020-10-19 NOTE — Progress Notes (Signed)
[facility] SURGICAL PROGRESS NOTE (cpt [phone])  Hospital Day(s): 3.   Interval History: Patient seen and examined, no acute events or new complaints overnight. Patient reports he is feeling much better this morning, denies abdominal pain, nausea, emesis, fever, chills. His CBC remains reassuring and without leukocytosis. sCr remains normal at 1.03. He has improvement in hypokalemia to 3.3.  NGT was clamped yesterday and residuals measured only at 75 ccs. He has done well and had multiple BMs  Review of Systems:  Constitutional: denies fever, chills  HEENT: denies cough or congestion  Respiratory: denies any shortness of breath  Cardiovascular: denies chest pain or palpitations  Gastrointestinal: denies abdominal pain, N/V, or diarrhea/and bowel function as per interval history Genitourinary: denies burning with urination or urinary frequency   Vital signs in last 24 hours: [min-max] current  Temp:  [97.5 F (36.4 C)-98.8 F (37.1 C)] 98 F (36.7 C) (12/17 0625) Pulse Rate:  [72-87] 74 (12/17 0625) Resp:  [15-20] 20 (12/17 0625) BP: (136-148)/(71-84) 141/81 (12/17 0625) SpO2:  [94 %-96 %] 96 % (12/17 0625)     Height: 5\' 6"  (167.6 cm) Weight: 89.4 kg BMI (Calculated): 31.83   Intake/Output last 2 shifts:  12/16 0701 - 12/17 0700 In: 2540.4 [I.V.:1880.7; IV Piggyback:659.6] Out: 175 [Urine:100; Emesis/NG output:75]   Physical Exam:  Constitutional: alert, cooperative and no distress  HENT: normocephalic without obvious abnormality, NGT in place; clamped Eyes: PERRL, EOM's grossly intact and symmetric  Respiratory: breathing non-labored at rest  Cardiovascular: regular rate, irregularly irregular Gastrointestinal:Obese, protuberant as baseline, abdominal pain resolved, appears distended, no rebound/guarding, certainly without peritonitis. Reducible umbilical hernia Musculoskeletal: UE and LE FROM,+1 pitting edema bilaterally   Labs:  CBC Latest Ref Rng &  Units 10/19/2020 10/18/2020 10/17/2020  WBC 4.0 - 10.5 K/uL 4.8 4.9 6.6  Hemoglobin 13.0 - 17.0 g/dL 11.0(L) 12.0(L) 12.4(L)  Hematocrit 39.0 - 52.0 % 33.0(L) 35.3(L) 35.9(L)  Platelets 150 - 400 K/uL 208 249 281   CMP Latest Ref Rng & Units 10/18/2020 10/18/2020 10/17/2020  Glucose 70 - 99 mg/dL 84 93 115(H)  BUN 8 - 23 mg/dL 15 17 20   Creatinine 0.61 - 1.24 mg/dL 1.03 1.10 1.11  Sodium 135 - 145 mmol/L 144 145 141  Potassium 3.5 - 5.1 mmol/L 3.4(L) 2.9(L) 3.1(L)  Chloride 98 - 111 mmol/L 113(H) 110 107  CO2 22 - 32 mmol/L 20(L) 23 22  Calcium 8.9 - 10.3 mg/dL 7.9(L) 7.9(L) 8.2(L)  Total Protein 6.5 - 8.1 g/dL - - 7.6  Total Bilirubin 0.3 - 1.2 mg/dL - - 1.0  Alkaline Phos 38 - 126 U/L - - 55  AST 15 - 41 U/L - - 24  ALT 0 - 44 U/L - - 13     Imaging studies:   CT Abdomen/Pelvis (10/19/2020) personally reviewed still with central loops of dilated small bowel, non-umbilical hernia, and stranding around the cecum which overall appears relatively unchanged compared to prior, and radiologist report pending   Assessment/Plan: (ICD-10's: K56.609) 74 y.o.malewith somewhat unclear picture bu clinically improved admitted with likely pSBo vs ileus secondary to either adhesive disease vs reducible umbilical hernia vs right sided colitis vs diverticulitis.    - I removed NGT at bedside  - Initiated CLD  - Wean IVF  - Continue IV Abx (Zosyn) - Monitor abdominal examination; on-going bowel function - Pain control prn; antiemetics prn - Replete K+; transition to PO; monitor - Mobilization as tolerated - No emergent surgical intervention  All of the above  findings and recommendations were discussed with the patient, and the medical team, and all of patient's questions were answered to his expressed satisfaction.  -- Edison Simon, PA-C [facility] 10/19/2020, 7:05 AM [phone] M-F: 7am - 4pm

## 2020-10-20 LAB — BASIC METABOLIC PANEL
Anion gap: 10 (ref 5–15)
BUN: 7 mg/dL — ABNORMAL LOW (ref 8–23)
CO2: 21 mmol/L — ABNORMAL LOW (ref 22–32)
Calcium: 7.7 mg/dL — ABNORMAL LOW (ref 8.9–10.3)
Chloride: 106 mmol/L (ref 98–111)
Creatinine, Ser: 1.12 mg/dL (ref 0.61–1.24)
GFR, Estimated: >60 mL/min (ref >60)
Glucose, Bld: 94 mg/dL (ref 70–99)
Potassium: 3.3 mmol/L — ABNORMAL LOW (ref 3.5–5.1)
Sodium: 137 mmol/L (ref 135–145)

## 2020-10-20 LAB — CBC
HCT: 32.8 % — ABNORMAL LOW (ref 39.0–52.0)
Hemoglobin: 10.9 g/dL — ABNORMAL LOW (ref 13.0–17.0)
MCH: 28.7 pg (ref 26.0–34.0)
MCHC: 33.2 g/dL (ref 30.0–36.0)
MCV: 86.3 fL (ref 80.0–100.0)
Platelets: 188 10*3/uL (ref 150–400)
RBC: 3.8 MIL/uL — ABNORMAL LOW (ref 4.22–5.81)
RDW: 13.8 % (ref 11.5–15.5)
WBC: 6.3 10*3/uL (ref 4.0–10.5)
nRBC: 0 % (ref 0.0–0.2)

## 2020-10-20 MED ORDER — SIMETHICONE 80 MG PO CHEW
80.0000 mg | CHEWABLE_TABLET | Freq: Four times a day (QID) | ORAL | Status: DC | PRN
  Administered 2020-10-20: 80 mg via ORAL
  Filled 2020-10-20: qty 1

## 2020-10-20 NOTE — Plan of Care (Signed)
Continuing with plan of care. 

## 2020-10-20 NOTE — Progress Notes (Signed)
CC: diverticulitis right sided  Subjective: Doing much better.  Abdominal pain improving.  Several bowel movements.  No nausea no vomiting tolerated liquid diet White count is normal.  Objective: Vital signs in last 24 hours: Temp:  [98.4 F (36.9 C)-98.5 F (36.9 C)] 98.5 F (36.9 C) (12/18 0519) Pulse Rate:  [62-68] 62 (12/18 0519) Resp:  [20] 20 (12/18 0519) BP: (138-151)/(75-99) 138/75 (12/18 0519) SpO2:  [95 %-97 %] 95 % (12/18 0519) Last BM Date: 10/19/20  Intake/Output from previous day: 12/17 0701 - 12/18 0700 In: 3774.7 [P.O.:840; I.V.:2784.6; IV Piggyback:150.1] Out: 700 [Urine:700] Intake/Output this shift: No intake/output data recorded.  Physical exam:  NAD, Abd: soft, nt, reducible UH, no peritonitis  Lab Results: CBC  Recent Labs    10/19/20 0433 10/20/20 0608  WBC 4.8 6.3  HGB 11.0* 10.9*  HCT 33.0* 32.8*  PLT 208 188   BMET Recent Labs    10/19/20 0433 10/20/20 0608  NA 140 137  K 3.3* 3.3*  CL 111 106  CO2 17* 21*  GLUCOSE 69* 94  BUN 13 7*  CREATININE 1.03 1.12  CALCIUM 7.8* 7.7*   PT/INR No results for input(s): LABPROT, INR in the last 72 hours. ABG No results for input(s): PHART, HCO3 in the last 72 hours.  Invalid input(s): PCO2, PO2  Studies/Results: CT ABDOMEN PELVIS W CONTRAST  Result Date: 10/19/2020 CLINICAL DATA:  Abdomen and pelvis pain. Gas and swelling. History of small-bowel obstruction. EXAM: CT ABDOMEN AND PELVIS WITH CONTRAST TECHNIQUE: Multidetector CT imaging of the abdomen and pelvis was performed using the standard protocol following bolus administration of intravenous contrast. CONTRAST:  132mL OMNIPAQUE IOHEXOL 300 MG/ML  SOLN COMPARISON:  10/16/2020 FINDINGS: Lower chest: Subsegmental atelectasis noted in the lung bases. Large hiatal hernia with approximately 75% of the stomach contained in the chest. Hepatobiliary: 2.2 cm well-defined low-density lesion in the anterior right liver is compatible with a cyst.  Gallbladder is distended. No intrahepatic or extrahepatic biliary dilation. Pancreas: No focal mass lesion. No dilatation of the main duct. No intraparenchymal cyst. No peripancreatic edema. Spleen: No splenomegaly. No focal mass lesion. Adrenals/Urinary Tract: No adrenal nodule or mass. 3.8 cm exophytic cyst noted upper pole right kidney. 4.3 cm exophytic cyst noted upper pole left kidney. No hydronephrosis or suspicious enhancing mass lesion in either kidney. No evidence for hydroureter. Bladder is decompressed. Stomach/Bowel: As above, there is a large hiatal hernia with approximately 75% of the stomach contained in the chest. NG tube tip is in the distal stomach. Duodenum is normally positioned as is the ligament of Treitz. Small bowel loops in the mid abdomen are fluid-filled and dilated up to 4.1 cm diameter. A discrete transition zone is not visible on today's exam although multiple dilated small bowel loops are identified in the region of the umbilical hernia and a supraumbilical hernia. The supraumbilical hernia contains small bowel attenuation and apparent fluid, as before. The inflammatory changes seen previously around the tip of the cecum persist in a similar fashion. No organized or rim enhancing abscess at this time. There is a small structure showing peripheral rim enhancement on axial 51/2 that has a short tubular configuration. Terminal ileum unremarkable colon is nondilated. Vascular/Lymphatic: There is abdominal aortic atherosclerosis without aneurysm. There is no gastrohepatic or hepatoduodenal ligament lymphadenopathy. No retroperitoneal or mesenteric lymphadenopathy. No pelvic sidewall lymphadenopathy. Reproductive: The prostate gland and seminal vesicles are unremarkable. Other: No substantial intraperitoneal free fluid. Musculoskeletal: No worrisome lytic or sclerotic osseous abnormality. Small left lumbar  hernia again noted, containing only fat. IMPRESSION: 1. No substantial interval  change. Diffuse fluid-filled small bowel distention up to 4.1 cm with soft tissue and fluid density identified in the supraumbilical hernia. Incarcerated supraumbilical hernia leading to small bowel obstruction cannot be excluded. 2. Similar appearance of the inflammatory changes around the tip of the cecum. No organized or rim enhancing abscess although there is a short, small tubular structure showing peripheral rim enhancement adjacent to the cecum. Appendicitis not excluded. 3. Large hiatal hernia with approximately 75% of the stomach contained in the chest. NG tube tip is in the distal stomach. 4. Hepatic and renal cysts. 5. Aortic Atherosclerosis (ICD10-I70.0). Electronically Signed   By: Misty Stanley M.D.   On: 10/19/2020 10:46   DG Abd 2 Views  Result Date: 10/19/2020 CLINICAL DATA:  Follow-up small-bowel dilatation EXAM: ABDOMEN - 2 VIEW COMPARISON:  CT from earlier in the same day. FINDINGS: Scattered large and small bowel gas is noted. Multiple dilated loops of small bowel are again identified similar to that seen on prior exam. Previously administered contrast now lies throughout the colon consistent with a partial small bowel obstruction. No free air is seen. No abnormal mass is noted. Contrast material is noted in the decompressed bladder IMPRESSION: Multiple dilated loops of small bowel similar to that seen on previous CT examination. Contrast material administered previously now lies throughout the colon consistent with a partial small bowel obstruction. Electronically Signed   By: Inez Catalina M.D.   On: 10/19/2020 16:36    Anti-infectives: Anti-infectives (From admission, onward)   Start     Dose/Rate Route Frequency Ordered Stop   10/17/20 0845  piperacillin-tazobactam (ZOSYN) IVPB 3.375 g        3.375 g 12.5 mL/hr over 240 Minutes Intravenous Every 8 hours 10/17/20 0838     10/16/20 2315  Ampicillin-Sulbactam (UNASYN) 3 g in sodium chloride 0.9 % 100 mL IVPB  Status:  Discontinued         3 g 200 mL/hr over 30 Minutes Intravenous Every 6 hours 10/16/20 2305 10/17/20 0834      Assessment/Plan: Right-sided diverticulitis responding to medical management.  Ileus improving.  Umbilical hernia is reducible, no need for surgical intervention.  He does have significant issues will take 1 at a time.  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  We will continue antibiotic therapy, advance diet as tolerated.  Discharge in the next 24 to 48 hours if his condition continues to improve   I had an extensive discussion with the patient regarding his disease process.  I spent over 40 minutes in this encounter with greater than 50% spent in coordination counseling of his care.   Caroleen Hamman, MD, [facility]  10/20/2020

## 2020-10-20 NOTE — Progress Notes (Signed)
Pt had several BM's again this shift.

## 2020-10-21 DIAGNOSIS — K5792 Diverticulitis of intestine, part unspecified, without perforation or abscess without bleeding: Secondary | ICD-10-CM

## 2020-10-21 DIAGNOSIS — Z978 Presence of other specified devices: Secondary | ICD-10-CM

## 2020-10-21 DIAGNOSIS — K56699 Other intestinal obstruction unspecified as to partial versus complete obstruction: Secondary | ICD-10-CM

## 2020-10-21 MED ORDER — METRONIDAZOLE 500 MG PO TABS: 500.0000 mg | ORAL_TABLET | Freq: Three times a day (TID) | ORAL | 0 refills | Status: DC

## 2020-10-21 MED ORDER — CIPROFLOXACIN HCL 500 MG PO TABS
500.0000 mg | ORAL_TABLET | Freq: Two times a day (BID) | ORAL | Status: DC
  Administered 2020-10-21: 500 mg via ORAL
  Filled 2020-10-21: qty 1

## 2020-10-21 MED ORDER — CIPROFLOXACIN HCL 500 MG PO TABS: 500.0000 mg | ORAL_TABLET | Freq: Two times a day (BID) | ORAL | 0 refills | Status: DC

## 2020-10-21 MED ORDER — METRONIDAZOLE 500 MG PO TABS
500.0000 mg | ORAL_TABLET | Freq: Three times a day (TID) | ORAL | Status: DC
  Administered 2020-10-21: 500 mg via ORAL
  Filled 2020-10-21 (×3): qty 1

## 2020-10-21 NOTE — Discharge Summary (Signed)
Patient ID: Christian Mora MRN: [id number] DOB/AGE: [birth year] 74 y.o.  Admit date: 10/16/2020 Discharge date: 10/21/2020   Discharge Diagnoses:  Active Problems:   SBO (small bowel obstruction) [facility])     Hospital Course: 74 year old male seen via the emergency room with abdominal pain nausea and vomiting consistent with small bowel obstruction.  There was a question about a potential strangulated umbilical hernia but when I evaluated him the hernia has always been reducible.  More importantly the CT scan had concerning findings of acute diverticulitis of the right colon.  He was admitted to the hospital NG tube was placed and appropriate IV antibiotics were started. Has significant NG tube output but subsequently continued to improve.  I we did perform an interval CT scan 3 days after initial CT scan showing a stabilization of the right-sided diverticulitis without any abscess without any free air or complication.  He continues to improve and were able to clamp the NG tube and he passed trial.  He continues to have bowel movements and flatus.  His diet was advanced and he did well. At The time of discharge the patient was ambulating,  pain was controlled.  His vital signs were stable and she was afebrile.   physical exam at discharge showed a pt  in no acute distress.  Awake and alert.  Abdomen: Soft incisions healing well without infection or peritonitis.  Extremities well-perfused and no edema.  Condition of the patient the time of discharge was stable Extensive discussion with the patient and the family regarding his issues.  He does have right-sided diverticulitis, umbilical hernia that is reducible and a large paraesophageal hernia. Like to first see how he reacts to biotic therapy and right-sided diverticulitis.  This was the culprit that caused an ileus. After the diverticulitis is treated we will have to reassess.  I spent more than 40 minutes in this encounter with majority of time  spent in coordination and counseling of his care HE will complete 2 week a/bs rx at home  Disposition: Discharge disposition: 01-Home or Self Care        Allergies as of 10/21/2020   No Known Allergies     Medication List    TAKE these medications   ALPRAZolam 0.5 MG tablet Commonly known as: XANAX 1/2 TO 1 TABLET BY MOUTH 3 TIMES DAILY AS NEEDED What changed:   how much to take  how to take this  when to take this  reasons to take this  additional instructions   amLODipine 10 MG tablet Commonly known as: NORVASC TAKE 1 TABLET BY MOUTH DAILY   augmented betamethasone dipropionate 0.05 % cream Commonly known as: DIPROLENE-AF Apply 1 application topically 2 (two) times daily as needed (rash).   ciprofloxacin 500 MG tablet Commonly known as: CIPRO Take 1 tablet (500 mg total) by mouth 2 (two) times daily.   diclofenac sodium 1 % Gel Commonly known as: VOLTAREN Apply 4 g topically 4 (four) times daily as needed. What changed: reasons to take this   fluticasone 50 MCG/ACT nasal spray Commonly known as: FLONASE SPRAY TWICE INTO EACH NOSTRIL ONCE DAILY What changed:   how much to take  how to take this  when to take this  additional instructions   hydrocortisone 2.5 % rectal cream Commonly known as: ANUSOL-HC APPLY TO AFFECTED AREAS 2 OR 3 TIMES DAILY AS NEEDED What changed:   how much to take  how to take this  when to take this  reasons to take  this  additional instructions   ibuprofen 200 MG tablet Commonly known as: ADVIL Take 200 mg by mouth every 6 (six) hours as needed for fever, headache or mild pain.   levothyroxine 137 MCG tablet Commonly known as: SYNTHROID TAKE 1 TABLET BY MOUTH DAILY BEFORE BREAKFAST What changed:   how much to take  how to take this  when to take this  additional instructions   metroNIDAZOLE 500 MG tablet Commonly known as: FLAGYL Take 1 tablet (500 mg total) by mouth every 8 (eight) hours.    MULTIVITAMIN/IRON PO Take 1 tablet by mouth every other day.   omeprazole 20 MG capsule Commonly known as: PRILOSEC TAKE 1 CAPSULE BY MOUTH TWICE DAILY BEFORE A MEAL What changed: See the new instructions.   tamsulosin 0.4 MG Caps capsule Commonly known as: FLOMAX Take 1 capsule (0.4 mg total) by mouth daily.       Follow-up Information    Hendrix Yurkovich, [place] F, MD Follow up in 3 week(s).   Specialty: General Surgery Contact information: [address] [place] [address] [phone]                Caroleen Hamman, MD FACS

## 2020-10-21 NOTE — Plan of Care (Signed)
Continuing with plan of care. 

## 2020-10-21 NOTE — Plan of Care (Signed)
Discharge teaching completed with patient who is in stable condition. 

## 2020-11-09 ENCOUNTER — Telehealth: Payer: Self-pay | Admitting: Family Medicine

## 2020-11-09 NOTE — Telephone Encounter (Signed)
Copied from [place] [phone]. Topic: Medicare AWV >> Nov 09, 2020 12:00 PM Cher Nakai R wrote: Reason for CRM:  Left message for patient to call back and schedule Medicare Annual Wellness Visit (AWV) in office.   If not able to come in office, please offer to do virtually.   Last AWV 11/07/2019  Please schedule at anytime with [facility] Health Advisor.  If any questions, please contact me at [phone]

## 2020-11-12 ENCOUNTER — Encounter: Payer: Self-pay | Admitting: Surgery

## 2020-11-12 ENCOUNTER — Ambulatory Visit (INDEPENDENT_AMBULATORY_CARE_PROVIDER_SITE_OTHER): Payer: Medicare Other | Admitting: Surgery

## 2020-11-12 ENCOUNTER — Telehealth: Payer: Self-pay

## 2020-11-12 ENCOUNTER — Other Ambulatory Visit: Payer: Self-pay

## 2020-11-12 VITALS — BP 152/80 | HR 97 | Temp 97.8°F | Ht 65.0 in | Wt 192.2 lb

## 2020-11-12 DIAGNOSIS — K5732 Diverticulitis of large intestine without perforation or abscess without bleeding: Secondary | ICD-10-CM

## 2020-11-12 NOTE — Patient Instructions (Addendum)
We will get a MRI in early March and call you with your appointment. A f/u appointment will be made with Dr. Dahlia Byes. If you have any concerns or questions, please free to call our office.    Hiatal Hernia  A hiatal hernia occurs when part of the stomach slides above the muscle that separates the abdomen from the chest (diaphragm). A person can be born with a hiatal hernia (congenital), or it may develop over time. In almost all cases of hiatal hernia, only the top part of the stomach pushes through the diaphragm. Many people have a hiatal hernia with no symptoms. The larger the hernia, the more likely it is that you will have symptoms. In some cases, a hiatal hernia allows stomach acid to flow back into the tube that carries food from your mouth to your stomach (esophagus). This may cause heartburn symptoms. Severe heartburn symptoms may mean that you have developed a condition called gastroesophageal reflux disease (GERD). What are the causes? This condition is caused by a weakness in the opening (hiatus) where the esophagus passes through the diaphragm to attach to the upper part of the stomach. A person may be born with a weakness in the hiatus, or a weakness can develop over time. What increases the risk? This condition is more likely to develop in:  Older people. Age is a major risk factor for a hiatal hernia, especially if you are over the age of 51.  Pregnant women.  People who are overweight.  People who have frequent constipation. What are the signs or symptoms? Symptoms of this condition usually develop in the form of GERD symptoms. Symptoms include:  Heartburn.  Belching.  Indigestion.  Trouble swallowing.  Coughing or wheezing.  Sore throat.  Hoarseness.  Chest pain.  Nausea and vomiting. How is this diagnosed? This condition may be diagnosed during testing for GERD. Tests that may be done include:  X-rays of your stomach or chest.  An upper gastrointestinal  (GI) series. This is an X-ray exam of your GI tract that is taken after you swallow a chalky liquid that shows up clearly on the X-ray.  Endoscopy. This is a procedure to look into your stomach using a thin, flexible tube that has a tiny camera and light on the end of it. How is this treated? This condition may be treated by:  Dietary and lifestyle changes to help reduce GERD symptoms.  Medicines. These may include: ? Over-the-counter antacids. ? Medicines that make your stomach empty more quickly. ? Medicines that block the production of stomach acid (H2 blockers). ? Stronger medicines to reduce stomach acid (proton pump inhibitors).  Surgery to repair the hernia, if other treatments are not helping. If you have no symptoms, you may not need treatment. Follow these instructions at home: Lifestyle and activity  Do not use any products that contain nicotine or tobacco, such as cigarettes and e-cigarettes. If you need help quitting, ask your health care provider.  Try to achieve and maintain a healthy body weight.  Avoid putting pressure on your abdomen. Anything that puts pressure on your abdomen increases the amount of acid that may be pushed up into your esophagus. ? Avoid bending over, especially after eating. ? Raise the head of your bed by putting blocks under the legs. This keeps your head and esophagus higher than your stomach. ? Do not wear tight clothing around your chest or stomach. ? Try not to strain when having a bowel movement, when urinating, or when  lifting heavy objects. Eating and drinking  Avoid foods that can worsen GERD symptoms. These may include: ? Fatty foods, like fried foods. ? Citrus fruits, like oranges or lemon. ? Other foods and drinks that contain acid, like orange juice or tomatoes. ? Spicy food. ? Chocolate.  Eat frequent small meals instead of three large meals a day. This helps prevent your stomach from getting too full. ? Eat slowly. ? Do not  lie down right after eating. ? Do not eat 1-2 hours before bed.  Do not drink beverages with caffeine. These include cola, coffee, cocoa, and tea.  Do not drink alcohol. General instructions  Take over-the-counter and prescription medicines only as told by your health care provider.  Keep all follow-up visits as told by your health care provider. This is important. Contact a health care provider if:  Your symptoms are not controlled with medicines or lifestyle changes.  You are having trouble swallowing.  You have coughing or wheezing that will not go away. Get help right away if:  Your pain is getting worse.  Your pain spreads to your arms, neck, jaw, teeth, or back.  You have shortness of breath.  You sweat for no reason.  You feel sick to your stomach (nauseous) or you vomit.  You vomit blood.  You have bright red blood in your stools.  You have black, tarry stools. This information is not intended to replace advice given to you by your health care provider. Make sure you discuss any questions you have with your health care provider. Document Revised: 10/02/2017 Document Reviewed: 05/25/2017 Elsevier Patient Education  [place].     Diverticulitis  Diverticulitis is when small pouches in your colon (large intestine) get infected or swollen. This causes pain in the belly (abdomen) and watery poop (diarrhea). These pouches are called diverticula. The pouches form in people who have a condition called diverticulosis. What are the causes? This condition may be caused by poop (stool) that gets trapped in the pouches in your colon. The poop lets germs (bacteria) grow in the pouches. This causes the infection. What increases the risk? You are more likely to get this condition if you have small pouches in your colon. The risk is higher if:  You are overweight or very overweight (obese).  You do not exercise enough.  You drink alcohol.  You smoke or use  products with tobacco in them.  You eat a diet that has a lot of red meat such as beef, pork, or lamb.  You eat a diet that does not have enough fiber in it.  You are older than 75 years of age. What are the signs or symptoms?  Pain in the belly. Pain is often on the left side, but it may be in other areas.  Fever and feeling cold.  Feeling like you may vomit.  Vomiting.  Having cramps.  Feeling full.  Changes to how often you poop.  Blood in your poop. How is this treated? Most cases are treated at home by:  Taking over-the-counter pain medicines.  Following a clear liquid diet.  Taking antibiotic medicines.  Resting. Very bad cases may need to be treated at a hospital. This may include:  Not eating or drinking.  Taking prescription pain medicine.  Getting antibiotic medicines through an IV tube.  Getting fluid and food through an IV tube.  Having surgery. When you are feeling better, your doctor may tell you to have a test to check your  colon (colonoscopy). Follow these instructions at home: Medicines  Take over-the-counter and prescription medicines only as told by your doctor. These include: ? Antibiotics. ? Pain medicines. ? Fiber pills. ? Probiotics. ? Stool softeners.  If you were prescribed an antibiotic medicine, take it as told by your doctor. Do not stop taking the antibiotic even if you start to feel better.  Ask your doctor if the medicine prescribed to you requires you to avoid driving or using machinery. Eating and drinking  Follow a diet as told by your doctor.  When you feel better, your doctor may tell you to change your diet. You may need to eat a lot of fiber. Fiber makes it easier to poop (have a bowel movement). Foods with fiber include: ? Berries. ? Beans. ? Lentils. ? Green vegetables.  Avoid eating red meat.   General instructions  Do not use any products that contain nicotine or tobacco, such as cigarettes,  e-cigarettes, and chewing tobacco. If you need help quitting, ask your doctor.  Exercise 3 or more times a week. Try to get 30 minutes each time. Exercise enough to sweat and make your heart beat faster.  Keep all follow-up visits as told by your doctor. This is important. Contact a doctor if:  Your pain does not get better.  You are not pooping like normal. Get help right away if:  Your pain gets worse.  Your symptoms do not get better.  Your symptoms get worse very fast.  You have a fever.  You vomit more than one time.  You have poop that is: ? Bloody. ? Black. ? Tarry. Summary  This condition happens when small pouches in your colon get infected or swollen.  Take medicines only as told by your doctor.  Follow a diet as told by your doctor.  Keep all follow-up visits as told by your doctor. This is important. This information is not intended to replace advice given to you by your health care provider. Make sure you discuss any questions you have with your health care provider. Document Revised: 08/01/2019 Document Reviewed: 08/01/2019 Elsevier Patient Education  2021 [employer].

## 2020-11-12 NOTE — Telephone Encounter (Signed)
Pt is scheduled for a MR Entero pelvis/abdomen on 01/01/21 @ 3pm. Arrive at 1pm at [facility]. NPO 4 hours prior.  Pt notified. Verbalizes understanding.

## 2020-11-13 NOTE — Progress Notes (Signed)
Subjective:   Christian Mora is a 75 y.o. male who presents for Medicare Annual/Subsequent preventive examination.  I connected with Jonne Ply today by telephone and verified that I am speaking with the correct person using two identifiers. Location patient: home Location provider: work Persons participating in the virtual visit: patient, provider.   I discussed the limitations, risks, security and privacy concerns of performing an evaluation and management service by telephone and the availability of in person appointments. I also discussed with the patient that there may be a patient responsible charge related to this service. The patient expressed understanding and verbally consented to this telephonic visit.    Interactive audio and video telecommunications were attempted between this provider and patient, however failed, due to patient having technical difficulties OR patient did not have access to video capability.  We continued and completed visit with audio only.   Review of Systems    N/A  Cardiac Risk Factors include: advanced age (>27men, >16 women);male gender;hypertension     Objective:    There were no vitals filed for this visit. There is no height or weight on file to calculate BMI.  Advanced Directives 11/14/2020 10/17/2020 10/16/2020 11/07/2019 11/05/2018 03/03/2018 10/16/2015  Does Patient Have a Medical Advance Directive? Yes Yes No Yes Yes Yes Yes  Type of Paramedic of [place];Living will - - Living will;Healthcare Power of [place];Living will [place];Living will Living will  Copy of [place] in Chart? Yes - validated most recent copy scanned in chart (See row information) - - Yes - validated most recent copy scanned in chart (See row information) Yes - validated most recent copy scanned in chart (See row information) No - copy requested -  Would patient like information  on creating a medical advance directive? - - No - Patient declined - - - -    Current Medications (verified) Outpatient Encounter Medications as of 11/14/2020  Medication Sig  . ALPRAZolam (XANAX) 0.5 MG tablet 1/2 TO 1 TABLET BY MOUTH 3 TIMES DAILY AS NEEDED (Patient taking differently: Take 0.25-0.5 mg by mouth 3 (three) times daily as needed for anxiety.)  . amLODipine (NORVASC) 10 MG tablet TAKE 1 TABLET BY MOUTH DAILY (Patient taking differently: Take 10 mg by mouth daily.)  . augmented betamethasone dipropionate (DIPROLENE-AF) 0.05 % cream Apply 1 application topically 2 (two) times daily as needed (rash).  . Bacillus Coagulans-Inulin (PROBIOTIC) 1-250 BILLION-MG CAPS Take 1 capsule by mouth daily at 6 (six) AM.  . diclofenac sodium (VOLTAREN) 1 % GEL Apply 4 g topically 4 (four) times daily as needed. (Patient taking differently: Apply 4 g topically 4 (four) times daily as needed (pain).)  . fluticasone (FLONASE) 50 MCG/ACT nasal spray SPRAY TWICE INTO EACH NOSTRIL ONCE DAILY (Patient taking differently: Place 2 sprays into both nostrils daily.)  . hydrocortisone (ANUSOL-HC) 2.5 % rectal cream APPLY TO AFFECTED AREAS 2 OR 3 TIMES DAILY AS NEEDED (Patient taking differently: Place 1 application rectally 3 (three) times daily as needed for hemorrhoids or anal itching.)  . ibuprofen (ADVIL,MOTRIN) 200 MG tablet Take 200 mg by mouth every 6 (six) hours as needed for fever, headache or mild pain.  Marland Kitchen levothyroxine (SYNTHROID) 137 MCG tablet TAKE 1 TABLET BY MOUTH DAILY BEFORE BREAKFAST (Patient taking differently: Take 137 mcg by mouth daily before breakfast.)  . Multiple Vitamins-Iron (MULTIVITAMIN/IRON PO) Take 1 tablet by mouth daily at 6 (six) AM.  . omeprazole (PRILOSEC)  20 MG capsule TAKE 1 CAPSULE BY MOUTH TWICE DAILY BEFORE A MEAL (Patient taking differently: Take 20 mg by mouth 2 (two) times daily before a meal.)  . tamsulosin (FLOMAX) 0.4 MG CAPS capsule Take 1 capsule (0.4 mg total) by  mouth daily.   No facility-administered encounter medications on file as of 11/14/2020.    Allergies (verified) Patient has no known allergies.   History: Past Medical History:  Diagnosis Date  . Allergy   . Anemia   . Anxiety   . Diverticulitis   . GERD (gastroesophageal reflux disease)   . Hyperlipidemia   . Hypertension   . Hypothyroidism   . Melanoma of skin ([place]) 03/13/2003  . Thyroid disease    Past Surgical History:  Procedure Laterality Date  . COLONOSCOPY WITH PROPOFOL N/A 03/03/2018   Procedure: COLONOSCOPY WITH PROPOFOL;  Surgeon: Jonathon Bellows, MD;  Location: [facility] ENDOSCOPY;  Service: Gastroenterology;  Laterality: N/A;  . ESOPHAGOGASTRODUODENOSCOPY (EGD) WITH PROPOFOL N/A 03/03/2018   Procedure: ESOPHAGOGASTRODUODENOSCOPY (EGD) WITH PROPOFOL;  Surgeon: Jonathon Bellows, MD;  Location: [facility] ENDOSCOPY;  Service: Gastroenterology;  Laterality: N/A;  . GIVENS CAPSULE STUDY N/A 03/12/2018   Procedure: GIVENS CAPSULE STUDY;  Surgeon: Jonathon Bellows, MD;  Location: [facility] ENDOSCOPY;  Service: Gastroenterology;  Laterality: N/A;  . lymph node removed  2004  . TONSILLECTOMY AND ADENOIDECTOMY  1954   Family History  Problem Relation Age of Onset  . Hypertension Mother   . Cancer Father        prostate  . Diabetes Son    Social History   Socioeconomic History  . Marital status: Married    Spouse name: Not on file  . Number of children: 2  . Years of education: Not on file  . Highest education level: Bachelor's degree (e.g., BA, AB, BS)  Occupational History  . Occupation: retired  Tobacco Use  . Smoking status: Never Smoker  . Smokeless tobacco: Never Used  Vaping Use  . Vaping Use: Never used  Substance and Sexual Activity  . Alcohol use: No  . Drug use: No  . Sexual activity: Yes  Other Topics Concern  . Not on file  Social History Narrative  . Not on file   Social Determinants of Health   Financial Resource Strain: Low Risk   . Difficulty of Paying Living  Expenses: Not hard at all  Food Insecurity: No Food Insecurity  . Worried About Charity fundraiser in the Last Year: Never true  . Ran Out of Food in the Last Year: Never true  Transportation Needs: No Transportation Needs  . Lack of Transportation (Medical): No  . Lack of Transportation (Non-Medical): No  Physical Activity: Sufficiently Active  . Days of Exercise per Week: 7 days  . Minutes of Exercise per Session: 30 min  Stress: No Stress Concern Present  . Feeling of Stress : Only a little  Social Connections: Moderately Integrated  . Frequency of Communication with Friends and Family: Three times a week  . Frequency of Social Gatherings with Friends and Family: Three times a week  . Attends Religious Services: More than 4 times per year  . Active Member of Clubs or Organizations: No  . Attends Archivist Meetings: Never  . Marital Status: Married    Tobacco Counseling Counseling given: Not Answered   Clinical Intake:  Pre-visit preparation completed: Yes  Pain : No/denies pain     Nutritional Risks: None Diabetes: No  How often do you need to have  someone help you when you read instructions, pamphlets, or other written materials from your doctor or pharmacy?: 1 - Never  Diabetic? No  Interpreter Needed?: No  Information entered by :: [facility], LPN   Activities of Daily Living In your present state of health, do you have any difficulty performing the following activities: 11/14/2020 10/17/2020  Hearing? N Y  Comment Wears bilateral hearing aids. hearing aids, doesn't have with him  Vision? N N  Difficulty concentrating or making decisions? N N  Walking or climbing stairs? N N  Dressing or bathing? N Y  Doing errands, shopping? N N  Preparing Food and eating ? N -  Using the Toilet? N -  In the past six months, have you accidently leaked urine? N -  Do you have problems with loss of bowel control? N -  Managing your Medications? N -  Managing  your Finances? N -  Housekeeping or managing your Housekeeping? N -  Some recent data might be hidden    Patient Care Team: Birdie Sons, MD as PCP - General (Family Medicine) Dasher, Rayvon Char, MD (Dermatology) Birder Robson, MD as Referring Physician (Ophthalmology) Kipp Laurence, MD as Referring Physician (Audiology)  Indicate any recent Medical Services you may have received from other than [facility] providers in the past year (date may be approximate).     Assessment:   This is a routine wellness examination for Rion.  Hearing/Vision screen No exam data present  Dietary issues and exercise activities discussed: Current Exercise Habits: Home exercise routine, Type of exercise: walking, Time (Minutes): 30, Frequency (Times/Week): 7, Weekly Exercise (Minutes/Week): 210, Intensity: Mild, Exercise limited by: None identified  Goals    . DIET - INCREASE WATER INTAKE     Recommend to drink at least 6-8 8oz glasses of water per day.      Depression Screen PHQ 2/9 Scores 11/14/2020 11/07/2019 11/07/2019 11/05/2018 11/05/2017 07/30/2015  PHQ - 2 Score 0 0 0 0 0 0    Fall Risk Fall Risk  11/14/2020 11/07/2019 11/05/2018 11/05/2017 07/30/2015  Falls in the past year? 0 0 0 No No  Number falls in past yr: 0 0 0 - -  Injury with Fall? 0 0 0 - -    FALL RISK PREVENTION PERTAINING TO THE HOME:  Any stairs in or around the home? Yes  If so, are there any without handrails? No  Home free of loose throw rugs in walkways, pet beds, electrical cords, etc? Yes  Adequate lighting in your home to reduce risk of falls? Yes   ASSISTIVE DEVICES UTILIZED TO PREVENT FALLS:  Life alert? No  Use of a cane, walker or w/c? No  Grab bars in the bathroom? No  Shower chair or bench in shower? No  Elevated toilet seat or a handicapped toilet? Yes    Cognitive Function: Normal cognitive status assessed by observation by this Nurse Health Advisor. No abnormalities found.      6CIT Screen 11/05/2018   What Year? 0 points  What month? 0 points  What time? 0 points  Count back from 20 0 points  Months in reverse 0 points  Repeat phrase 0 points  Total Score 0    Immunizations Immunization History  Administered Date(s) Administered  . Influenza Split 10/06/2007, 08/10/2009, 08/03/2010, 08/13/2011, 08/13/2012  . Influenza, High Dose Seasonal PF 09/14/2014, 07/30/2015, 08/12/2016, 07/23/2018, 07/15/2019  . Influenza,inj,Quad PF,6+ Mos 07/20/2013  . Influenza-Unspecified 08/10/2017  . Moderna Sars-Covid-2 Vaccination 12/16/2019, 01/16/2020, 08/27/2020  .  Pneumococcal Conjugate-13 09/14/2014  . Pneumococcal Polysaccharide-23 08/13/2012  . Tdap 11/10/2017  . Zoster 08/29/2013  . Zoster Recombinat (Shingrix) 01/27/2018, 04/22/2018    TDAP status: Up to date  Flu Vaccine status: Up to date  Pneumococcal vaccine status: Up to date  Covid-19 vaccine status: Completed vaccines  Qualifies for Shingles Vaccine? Yes   Zostavax completed Yes   Shingrix Completed?: Yes  Screening Tests Health Maintenance  Topic Date Due  . COLONOSCOPY (Pts 45-67yrs Insurance coverage will need to be confirmed)  03/03/2021  . TETANUS/TDAP  11/11/2027  . INFLUENZA VACCINE  Completed  . COVID-19 Vaccine  Completed  . Hepatitis C Screening  Completed  . PNA vac Low Risk Adult  Completed    Health Maintenance  There are no preventive care reminders to display for this patient.  Colorectal cancer screening: Type of screening: Colonoscopy. Completed 03/03/18. Repeat every 3 years  Lung Cancer Screening: (Low Dose CT Chest recommended if Age 28-80 years, 30 pack-year currently smoking OR have quit w/in 15years.) does not qualify.   Additional Screening:  Hepatitis C Screening: Up to date  Vision Screening: Recommended annual ophthalmology exams for early detection of glaucoma and other disorders of the eye. Is the patient up to date with their annual eye exam?  Yes  Who is the provider or what  is the name of the office in which the patient attends annual eye exams? Dr George Ina @ [facility] If pt is not established with a provider, would they like to be referred to a provider to establish care? No .   Dental Screening: Recommended annual dental exams for proper oral hygiene  Community Resource Referral / Chronic Care Management: CRR required this visit?  No   CCM required this visit?  No      Plan:     I have personally reviewed and noted the following in the patient's chart:   . Medical and social history . Use of alcohol, tobacco or illicit drugs  . Current medications and supplements . Functional ability and status . Nutritional status . Physical activity . Advanced directives . List of other physicians . Hospitalizations, surgeries, and ER visits in previous 12 months . Vitals . Screenings to include cognitive, depression, and falls . Referrals and appointments  In addition, I have reviewed and discussed with patient certain preventive protocols, quality metrics, and best practice recommendations. A written personalized care plan for preventive services as well as general preventive health recommendations were provided to patient.     Louden Houseworth [place], [place]   [id number]   Nurse Notes: None.

## 2020-11-13 NOTE — Progress Notes (Signed)
Outpatient Surgical Follow Up  11/13/2020  COLONEL KRAUSER is an 75 y.o. male.   Chief Complaint  Patient presents with  . Hospitalization Follow-up    Diverticulitis/hiatal hernia    HPI: Randall Hiss is a 75 year old male well-known to me with history of right-sided diverticulitis and a small bowel diverticulosis that presented a few weeks ago with partial small bowel obstruction.  He also has a reducible umbilical hernia.  Initially was thought that the small bowel obstruction worse related to the hernia but this clearly was not the case.  He completed antibiotic therapy and feels much better.  Today he comes for follow-up.  He denies any fevers any chills he is tolerating regular diet and having regular bowel movements.  He denies any abdominal pain. I have personally reviewed his CT scan from his hospitalization showing evidence of right sided colon inflammatory response.  There is no evidence of abscess.  I have also reviewed his records showing evidence of small bowel diverticulosis capsule endoscopy.  He has a large paraesophageal hernia that at this time is causing very mild symptoms  Past Medical History:  Diagnosis Date  . Allergy   . Anemia   . Anxiety   . GERD (gastroesophageal reflux disease)   . Hyperlipidemia   . Hypertension   . Hypothyroidism   . Melanoma of skin ([place]) 03/13/2003  . Thyroid disease     Past Surgical History:  Procedure Laterality Date  . COLONOSCOPY WITH PROPOFOL N/A 03/03/2018   Procedure: COLONOSCOPY WITH PROPOFOL;  Surgeon: Jonathon Bellows, MD;  Location: [facility] ENDOSCOPY;  Service: Gastroenterology;  Laterality: N/A;  . ESOPHAGOGASTRODUODENOSCOPY (EGD) WITH PROPOFOL N/A 03/03/2018   Procedure: ESOPHAGOGASTRODUODENOSCOPY (EGD) WITH PROPOFOL;  Surgeon: Jonathon Bellows, MD;  Location: [facility] ENDOSCOPY;  Service: Gastroenterology;  Laterality: N/A;  . GIVENS CAPSULE STUDY N/A 03/12/2018   Procedure: GIVENS CAPSULE STUDY;  Surgeon: Jonathon Bellows, MD;  Location: [facility] ENDOSCOPY;   Service: Gastroenterology;  Laterality: N/A;  . lymph node removed  2004  . TONSILLECTOMY AND ADENOIDECTOMY  1954    Family History  Problem Relation Age of Onset  . Hypertension Mother   . Cancer Father        prostate  . Diabetes Son     Social History:  reports that he has never smoked. He has never used smokeless tobacco. He reports that he does not drink alcohol and does not use drugs.  Allergies: No Known Allergies  Medications reviewed.    ROS Full ROS performed and is otherwise negative other than what is stated in HPI   BP (!) 152/80   Pulse 97   Temp 97.8 F (36.6 C) (Oral)   Ht 5\' 5"  (1.651 m)   Wt 192 lb 3.2 oz (87.2 kg)   SpO2 98%   BMI 31.98 kg/m   Physical Exam Vitals and nursing note reviewed. Exam conducted with a chaperone present.  Constitutional:      General: He is not in acute distress.    Appearance: Normal appearance. He is normal weight.  Cardiovascular:     Rate and Rhythm: Normal rate and regular rhythm.  Pulmonary:     Effort: Pulmonary effort is normal. No respiratory distress.     Breath sounds: Normal breath sounds. No stridor. No wheezing.  Abdominal:     General: Abdomen is flat. There is no distension.     Palpations: Abdomen is soft. There is no mass.     Tenderness: There is no abdominal tenderness. There is no  guarding or rebound.     Hernia: No hernia is present.  Musculoskeletal:        General: Normal range of motion.     Cervical back: Normal range of motion and neck supple. No rigidity or tenderness.  Skin:    General: Skin is warm and dry.     Capillary Refill: Capillary refill takes less than 2 seconds.  Neurological:     General: No focal deficit present.     Mental Status: He is alert and oriented to person, place, and time.  Psychiatric:        Mood and Affect: Mood normal.        Behavior: Behavior normal.        Thought Content: Thought content normal.        Judgment: Judgment normal.        Assessment/Plan: 75 year old male with trembly a history of right colon diverticulitis I cannot exclude that last episode was related to small bowel diverticulitis as well.  He does have an umbilical hernia that is reducible.  Given the chronicity of his GI abnormalities and they diverticulitis on the right side and small bowel diverticulitis I do think it is appropriate to perform further work-up of his a small bowel.  Discussed with him in detail about the differences between CT enterography and MR enterography.  He does not seem to tolerate the contrast that is using the CT scan therefore I will choose for MR enterography to assess his small bowel and the evidence of diverticulitis.  Patient currently does not need any immediate interventions and he is not toxic.  He is very appreciative of his care    Greater than 50% of the 45 minutes  visit was spent in counseling/coordination of care   Caroleen Hamman, MD [facility]

## 2020-11-14 ENCOUNTER — Other Ambulatory Visit: Payer: Self-pay

## 2020-11-14 ENCOUNTER — Ambulatory Visit (INDEPENDENT_AMBULATORY_CARE_PROVIDER_SITE_OTHER): Payer: Medicare Other

## 2020-11-14 ENCOUNTER — Telehealth: Payer: Self-pay

## 2020-11-14 DIAGNOSIS — E039 Hypothyroidism, unspecified: Secondary | ICD-10-CM

## 2020-11-14 DIAGNOSIS — Z0000 Encounter for general adult medical examination without abnormal findings: Secondary | ICD-10-CM | POA: Diagnosis not present

## 2020-11-14 DIAGNOSIS — K760 Fatty (change of) liver, not elsewhere classified: Secondary | ICD-10-CM

## 2020-11-14 DIAGNOSIS — D649 Anemia, unspecified: Secondary | ICD-10-CM

## 2020-11-14 DIAGNOSIS — I10 Essential (primary) hypertension: Secondary | ICD-10-CM

## 2020-11-14 NOTE — Telephone Encounter (Signed)
Spoke with pt today to complete his telephonic AWV and pt inquired about having his blood work completed prior to his CPE scheduled on 12/03/20. Pt would prefer for his BW to be done by that apt for discussion if needed.  Please advise, thank you!

## 2020-11-14 NOTE — Patient Instructions (Signed)
Christian Mora , Thank you for taking time to come for your Medicare Wellness Visit. I appreciate your ongoing commitment to your health goals. Please review the following plan we discussed and let me know if I can assist you in the future.   Screening recommendations/referrals: Colonoscopy: Up to date, due 03/2021 Recommended yearly ophthalmology/optometry visit for glaucoma screening and checkup Recommended yearly dental visit for hygiene and checkup  Vaccinations: Influenza vaccine: Done 07/30/20 Pneumococcal vaccine: Completed series Tdap vaccine: Up to date, due 11/2027 Shingles vaccine: Completed series    Advanced directives: Currently on file.  Conditions/risks identified: Recommend to drink at least 6-8 8oz glasses of water per day.  Next appointment: 12/03/20 @ 8:20 AM with Dr Caryn Section. Declined scheduling an AWV for 2023 at this time.   Preventive Care 31 Years and Older, Male Preventive care refers to lifestyle choices and visits with your health care provider that can promote health and wellness. What does preventive care include?  A yearly physical exam. This is also called an annual well check.  Dental exams once or twice a year.  Routine eye exams. Ask your health care provider how often you should have your eyes checked.  Personal lifestyle choices, including:  Daily care of your teeth and gums.  Regular physical activity.  Eating a healthy diet.  Avoiding tobacco and drug use.  Limiting alcohol use.  Practicing safe sex.  Taking low doses of aspirin every day.  Taking vitamin and mineral supplements as recommended by your health care provider. What happens during an annual well check? The services and screenings done by your health care provider during your annual well check will depend on your age, overall health, lifestyle risk factors, and family history of disease. Counseling  Your health care provider may ask you questions about your:  Alcohol  use.  Tobacco use.  Drug use.  Emotional well-being.  Home and relationship well-being.  Sexual activity.  Eating habits.  History of falls.  Memory and ability to understand (cognition).  Work and work Statistician. Screening  You may have the following tests or measurements:  Height, weight, and BMI.  Blood pressure.  Lipid and cholesterol levels. These may be checked every 5 years, or more frequently if you are over 26 years old.  Skin check.  Lung cancer screening. You may have this screening every year starting at age 80 if you have a 30-pack-year history of smoking and currently smoke or have quit within the past 15 years.  Fecal occult blood test (FOBT) of the stool. You may have this test every year starting at age 61.  Flexible sigmoidoscopy or colonoscopy. You may have a sigmoidoscopy every 5 years or a colonoscopy every 10 years starting at age 62.  Prostate cancer screening. Recommendations will vary depending on your family history and other risks.  Hepatitis C blood test.  Hepatitis B blood test.  Sexually transmitted disease (STD) testing.  Diabetes screening. This is done by checking your blood sugar (glucose) after you have not eaten for a while (fasting). You may have this done every 1-3 years.  Abdominal aortic aneurysm (AAA) screening. You may need this if you are a current or former smoker.  Osteoporosis. You may be screened starting at age 38 if you are at high risk. Talk with your health care provider about your test results, treatment options, and if necessary, the need for more tests. Vaccines  Your health care provider may recommend certain vaccines, such as:  Influenza vaccine. This  is recommended every year.  Tetanus, diphtheria, and acellular pertussis (Tdap, Td) vaccine. You may need a Td booster every 10 years.  Zoster vaccine. You may need this after age 43.  Pneumococcal 13-valent conjugate (PCV13) vaccine. One dose is  recommended after age 69.  Pneumococcal polysaccharide (PPSV23) vaccine. One dose is recommended after age 58. Talk to your health care provider about which screenings and vaccines you need and how often you need them. This information is not intended to replace advice given to you by your health care provider. Make sure you discuss any questions you have with your health care provider. Document Released: 11/16/2015 Document Revised: 07/09/2016 Document Reviewed: 08/21/2015 Elsevier Interactive Patient Education  2017 [place] Prevention in the Home Falls can cause injuries. They can happen to people of all ages. There are many things you can do to make your home safe and to help prevent falls. What can I do on the outside of my home?  Regularly fix the edges of walkways and driveways and fix any cracks.  Remove anything that might make you trip as you walk through a door, such as a raised step or threshold.  Trim any bushes or trees on the path to your home.  Use bright outdoor lighting.  Clear any walking paths of anything that might make someone trip, such as rocks or tools.  Regularly check to see if handrails are loose or broken. Make sure that both sides of any steps have handrails.  Any raised decks and porches should have guardrails on the edges.  Have any leaves, snow, or ice cleared regularly.  Use sand or salt on walking paths during winter.  Clean up any spills in your garage right away. This includes oil or grease spills. What can I do in the bathroom?  Use night lights.  Install grab bars by the toilet and in the tub and shower. Do not use towel bars as grab bars.  Use non-skid mats or decals in the tub or shower.  If you need to sit down in the shower, use a plastic, non-slip stool.  Keep the floor dry. Clean up any water that spills on the floor as soon as it happens.  Remove soap buildup in the tub or shower regularly.  Attach bath mats  securely with double-sided non-slip rug tape.  Do not have throw rugs and other things on the floor that can make you trip. What can I do in the bedroom?  Use night lights.  Make sure that you have a light by your bed that is easy to reach.  Do not use any sheets or blankets that are too big for your bed. They should not hang down onto the floor.  Have a firm chair that has side arms. You can use this for support while you get dressed.  Do not have throw rugs and other things on the floor that can make you trip. What can I do in the kitchen?  Clean up any spills right away.  Avoid walking on wet floors.  Keep items that you use a lot in easy-to-reach places.  If you need to reach something above you, use a strong step stool that has a grab bar.  Keep electrical cords out of the way.  Do not use floor polish or wax that makes floors slippery. If you must use wax, use non-skid floor wax.  Do not have throw rugs and other things on the floor that can make you  trip. What can I do with my stairs?  Do not leave any items on the stairs.  Make sure that there are handrails on both sides of the stairs and use them. Fix handrails that are broken or loose. Make sure that handrails are as long as the stairways.  Check any carpeting to make sure that it is firmly attached to the stairs. Fix any carpet that is loose or worn.  Avoid having throw rugs at the top or bottom of the stairs. If you do have throw rugs, attach them to the floor with carpet tape.  Make sure that you have a light switch at the top of the stairs and the bottom of the stairs. If you do not have them, ask someone to add them for you. What else can I do to help prevent falls?  Wear shoes that:  Do not have high heels.  Have rubber bottoms.  Are comfortable and fit you well.  Are closed at the toe. Do not wear sandals.  If you use a stepladder:  Make sure that it is fully opened. Do not climb a closed  stepladder.  Make sure that both sides of the stepladder are locked into place.  Ask someone to hold it for you, if possible.  Clearly mark and make sure that you can see:  Any grab bars or handrails.  First and last steps.  Where the edge of each step is.  Use tools that help you move around (mobility aids) if they are needed. These include:  Canes.  Walkers.  Scooters.  Crutches.  Turn on the lights when you go into a dark area. Replace any light bulbs as soon as they burn out.  Set up your furniture so you have a clear path. Avoid moving your furniture around.  If any of your floors are uneven, fix them.  If there are any pets around you, be aware of where they are.  Review your medicines with your doctor. Some medicines can make you feel dizzy. This can increase your chance of falling. Ask your doctor what other things that you can do to help prevent falls. This information is not intended to replace advice given to you by your health care provider. Make sure you discuss any questions you have with your health care provider. Document Released: 08/16/2009 Document Revised: 03/27/2016 Document Reviewed: 11/24/2014 Elsevier Interactive Patient Education  2017 [employer].

## 2020-11-16 NOTE — Telephone Encounter (Signed)
Future order placed. He can stop be any morning before his physical, needs to be fasting.

## 2020-11-19 ENCOUNTER — Other Ambulatory Visit: Payer: Self-pay | Admitting: Family Medicine

## 2020-11-19 DIAGNOSIS — K219 Gastro-esophageal reflux disease without esophagitis: Secondary | ICD-10-CM

## 2020-11-20 NOTE — Telephone Encounter (Signed)
Patient advised and verbalized understanding 

## 2020-11-21 ENCOUNTER — Other Ambulatory Visit: Payer: Self-pay

## 2020-11-21 DIAGNOSIS — K5732 Diverticulitis of large intestine without perforation or abscess without bleeding: Secondary | ICD-10-CM

## 2020-11-21 DIAGNOSIS — E039 Hypothyroidism, unspecified: Secondary | ICD-10-CM

## 2020-11-21 DIAGNOSIS — I10 Essential (primary) hypertension: Secondary | ICD-10-CM

## 2020-11-21 DIAGNOSIS — D649 Anemia, unspecified: Secondary | ICD-10-CM

## 2020-11-22 LAB — CBC
Hematocrit: 40.7 % (ref 37.5–51.0)
Hemoglobin: 13.4 g/dL (ref 13.0–17.7)
MCH: 28.5 pg (ref 26.6–33.0)
MCHC: 32.9 g/dL (ref 31.5–35.7)
MCV: 86 fL (ref 79–97)
Platelets: 208 10*3/uL (ref 150–450)
RBC: 4.71 x10E6/uL (ref 4.14–5.80)
RDW: 14.6 % (ref 11.6–15.4)
WBC: 5.6 10*3/uL (ref 3.4–10.8)

## 2020-11-22 LAB — RENAL FUNCTION PANEL
Albumin: 4.3 g/dL (ref 3.7–4.7)
BUN/Creatinine Ratio: 12 (ref 10–24)
BUN: 12 mg/dL (ref 8–27)
CO2: 21 mmol/L (ref 20–29)
Calcium: 9 mg/dL (ref 8.6–10.2)
Chloride: 104 mmol/L (ref 96–106)
Creatinine, Ser: 1 mg/dL (ref 0.76–1.27)
GFR calc Af Amer: 85 mL/min/{1.73_m2} (ref >59)
GFR calc non Af Amer: 74 mL/min/{1.73_m2} (ref >59)
Glucose: 98 mg/dL (ref 65–99)
Phosphorus: 3.2 mg/dL (ref 2.8–4.1)
Potassium: 3.9 mmol/L (ref 3.5–5.2)
Sodium: 139 mmol/L (ref 134–144)

## 2020-11-22 LAB — TSH: TSH: 3.17 u[IU]/mL (ref 0.450–4.500)

## 2020-11-22 LAB — T4, FREE: Free T4: 1.36 ng/dL (ref 0.82–1.77)

## 2020-12-03 ENCOUNTER — Encounter: Payer: Self-pay | Admitting: Family Medicine

## 2020-12-03 ENCOUNTER — Ambulatory Visit (INDEPENDENT_AMBULATORY_CARE_PROVIDER_SITE_OTHER): Payer: Medicare Other | Admitting: Family Medicine

## 2020-12-03 ENCOUNTER — Other Ambulatory Visit: Payer: Self-pay

## 2020-12-03 VITALS — BP 160/72 | HR 81 | Resp 16 | Ht 65.0 in | Wt 192.0 lb

## 2020-12-03 DIAGNOSIS — K449 Diaphragmatic hernia without obstruction or gangrene: Secondary | ICD-10-CM | POA: Diagnosis not present

## 2020-12-03 DIAGNOSIS — E785 Hyperlipidemia, unspecified: Secondary | ICD-10-CM | POA: Diagnosis not present

## 2020-12-03 DIAGNOSIS — K219 Gastro-esophageal reflux disease without esophagitis: Secondary | ICD-10-CM

## 2020-12-03 DIAGNOSIS — I10 Essential (primary) hypertension: Secondary | ICD-10-CM

## 2020-12-03 DIAGNOSIS — D509 Iron deficiency anemia, unspecified: Secondary | ICD-10-CM | POA: Diagnosis not present

## 2020-12-03 DIAGNOSIS — E039 Hypothyroidism, unspecified: Secondary | ICD-10-CM

## 2020-12-03 DIAGNOSIS — K429 Umbilical hernia without obstruction or gangrene: Secondary | ICD-10-CM

## 2020-12-03 DIAGNOSIS — K56609 Unspecified intestinal obstruction, unspecified as to partial versus complete obstruction: Secondary | ICD-10-CM | POA: Diagnosis not present

## 2020-12-03 MED ORDER — OMEPRAZOLE 20 MG PO CPDR: DELAYED_RELEASE_CAPSULE | ORAL | 12 refills | Status: DC

## 2020-12-03 NOTE — Patient Instructions (Addendum)

## 2020-12-03 NOTE — Progress Notes (Signed)
[name]   DOB: [birth year]   75 y.o. Male  MRN: [id number] Visit Date: 12/03/2020  Today's healthcare provider: Lelon Huh, MD   Chief Complaint  Patient presents with  . Hypertension   Patient had AWE on 11/14/2020.   Subjective    Christian Mora is a 75 y.o. male who presents today for a follow up.   Hypertension, follow-up  BP Readings from Last 3 Encounters:  12/03/20 (!) 160/72  11/12/20 (!) 152/80  10/21/20 (!) 158/79   Wt Readings from Last 3 Encounters:  12/03/20 192 lb (87.1 kg)  11/12/20 192 lb 3.2 oz (87.2 kg)  10/17/20 197 lb 1.5 oz (89.4 kg)     He was last seen for hypertension 4 months ago.  BP at that visit was 128/70. Management since that visit includes no medication changes.  He reports good compliance with treatment. He is not having side effects.  He is following a Regular diet. He is not exercising. He does not smoke.  Use of agents associated with hypertension: none.   Outside blood pressures are checked daily and are consistently in the 120s/60s Symptoms: No chest pain No chest pressure  No palpitations No syncope  No dyspnea No orthopnea  No paroxysmal nocturnal dyspnea No lower extremity edema   Pertinent labs: Lab Results  Component Value Date   CHOL 194 03/21/2020   HDL 41 03/21/2020   LDLCALC 127 (H) 03/21/2020   TRIG 142 03/21/2020   CHOLHDL 4.7 03/21/2020   Lab Results  Component Value Date   NA 139 11/21/2020   K 3.9 11/21/2020   CREATININE 1.00 11/21/2020   GFRNONAA 74 11/21/2020   GFRAA 85 11/21/2020   GLUCOSE 98 11/21/2020     The 10-year ASCVD risk score Mikey Bussing DC Jr., et al., 2013) is: 39.1%   Since last seen he was admitted with suspected SBO and inflammatory changes around cecum suspicious for diverticulitis or appendicitis. He was treated with ND decompression and antibiotic regiment of ciprofloxacin and metronidazole. Also noted to have a very large hiatal hernia. He is being followed  by Dr. Dahlia Byes and has been schedule for MRI. He is no longer having pain, but does have a lot of gas and fills up quickly when eating. He is also noted by have been persistently hypocalcemic.  Lab Results  Component Value Date   TSH 3.170 11/21/2020    Last CBC Lab Results  Component Value Date   WBC 5.6 11/21/2020   HGB 13.4 11/21/2020   HCT 40.7 11/21/2020   MCV 86 11/21/2020   MCH 28.5 11/21/2020   RDW 14.6 11/21/2020   PLT 208 11/21/2020    Past Medical History:  Diagnosis Date  . Allergy   . Anemia   . Anxiety   . Diverticulitis   . GERD (gastroesophageal reflux disease)   . Hyperlipidemia   . Hypertension   . Hypothyroidism   . Melanoma of skin ([place]) 03/13/2003  . Thyroid disease    Past Surgical History:  Procedure Laterality Date  . COLONOSCOPY WITH PROPOFOL N/A 03/03/2018   Procedure: COLONOSCOPY WITH PROPOFOL;  Surgeon: Jonathon Bellows, MD;  Location: [facility] ENDOSCOPY;  Service: Gastroenterology;  Laterality: N/A;  . ESOPHAGOGASTRODUODENOSCOPY (EGD) WITH PROPOFOL N/A 03/03/2018   Procedure: ESOPHAGOGASTRODUODENOSCOPY (EGD) WITH PROPOFOL;  Surgeon: Jonathon Bellows, MD;  Location: [facility] ENDOSCOPY;  Service: Gastroenterology;  Laterality: N/A;  . GIVENS CAPSULE STUDY N/A 03/12/2018   Procedure: GIVENS CAPSULE STUDY;  Surgeon: Vicente Males,  Bailey Mech, MD;  Location: [place];  Service: Gastroenterology;  Laterality: N/A;  . lymph node removed  2004  . TONSILLECTOMY AND ADENOIDECTOMY  1954   Social History   Socioeconomic History  . Marital status: Married    Spouse name: Not on file  . Number of children: 2  . Years of education: Not on file  . Highest education level: Bachelor's degree (e.g., BA, AB, BS)  Occupational History  . Occupation: retired  Tobacco Use  . Smoking status: Never Smoker  . Smokeless tobacco: Never Used  Vaping Use  . Vaping Use: Never used  Substance and Sexual Activity  . Alcohol use: No  . Drug use: No  . Sexual activity: Yes  Other Topics Concern   . Not on file  Social History Narrative  . Not on file   Social Determinants of Health   Financial Resource Strain: Low Risk   . Difficulty of Paying Living Expenses: Not hard at all  Food Insecurity: No Food Insecurity  . Worried About Charity fundraiser in the Last Year: Never true  . Ran Out of Food in the Last Year: Never true  Transportation Needs: No Transportation Needs  . Lack of Transportation (Medical): No  . Lack of Transportation (Non-Medical): No  Physical Activity: Sufficiently Active  . Days of Exercise per Week: 7 days  . Minutes of Exercise per Session: 30 min  Stress: No Stress Concern Present  . Feeling of Stress : Only a little  Social Connections: Moderately Integrated  . Frequency of Communication with Friends and Family: Three times a week  . Frequency of Social Gatherings with Friends and Family: Three times a week  . Attends Religious Services: More than 4 times per year  . Active Member of Clubs or Organizations: No  . Attends Archivist Meetings: Never  . Marital Status: Married  Human resources officer Violence: Not At Risk  . Fear of Current or Ex-Partner: No  . Emotionally Abused: No  . Physically Abused: No  . Sexually Abused: No   Family Status  Relation Name Status  . Mother  Alive  . Father  Deceased at age 16       acute leukemia  . Son  Alive  . Sister  Alive  . Son  Alive   Family History  Problem Relation Age of Onset  . Hypertension Mother   . Cancer Father        prostate  . Diabetes Son    No Known Allergies  Patient Care Team: Birdie Sons, MD as PCP - General (Family Medicine) Dasher, Rayvon Char, MD (Dermatology) Birder Robson, MD as Referring Physician (Ophthalmology) Kipp Laurence, MD as Referring Physician (Audiology)   Medications: Outpatient Medications Prior to Visit  Medication Sig  . ALPRAZolam (XANAX) 0.5 MG tablet 1/2 TO 1 TABLET BY MOUTH 3 TIMES DAILY AS NEEDED (Patient taking differently:  Take 0.25-0.5 mg by mouth 3 (three) times daily as needed for anxiety.)  . amLODipine (NORVASC) 10 MG tablet TAKE 1 TABLET BY MOUTH DAILY (Patient taking differently: Take 10 mg by mouth daily.)  . augmented betamethasone dipropionate (DIPROLENE-AF) 0.05 % cream Apply 1 application topically 2 (two) times daily as needed (rash).  . Bacillus Coagulans-Inulin (PROBIOTIC) 1-250 BILLION-MG CAPS Take 1 capsule by mouth daily at 6 (six) AM.  . diclofenac sodium (VOLTAREN) 1 % GEL Apply 4 g topically 4 (four) times daily as needed. (Patient taking differently: Apply 4 g topically 4 (four)  times daily as needed (pain).)  . fluticasone (FLONASE) 50 MCG/ACT nasal spray SPRAY TWICE INTO EACH NOSTRIL ONCE DAILY (Patient taking differently: Place 2 sprays into both nostrils daily.)  . hydrocortisone (ANUSOL-HC) 2.5 % rectal cream APPLY TO AFFECTED AREAS 2 OR 3 TIMES DAILY AS NEEDED (Patient taking differently: Place 1 application rectally 3 (three) times daily as needed for hemorrhoids or anal itching.)  . ibuprofen (ADVIL,MOTRIN) 200 MG tablet Take 200 mg by mouth every 6 (six) hours as needed for fever, headache or mild pain.  Marland Kitchen levothyroxine (SYNTHROID) 137 MCG tablet TAKE 1 TABLET BY MOUTH DAILY BEFORE BREAKFAST (Patient taking differently: Take 137 mcg by mouth daily before breakfast.)  . Multiple Vitamins-Iron (MULTIVITAMIN/IRON PO) Take 1 tablet by mouth daily at 6 (six) AM.  . omeprazole (PRILOSEC) 20 MG capsule TAKE 1 CAPSULE BY MOUTH TWICE DAILY BEFORE A MEAL  . tamsulosin (FLOMAX) 0.4 MG CAPS capsule Take 1 capsule (0.4 mg total) by mouth daily.   No facility-administered medications prior to visit.       Objective    BP (!) 160/72   Pulse 81   Resp 16   Ht 5\' 5"  (1.651 m)   Wt 192 lb (87.1 kg)   BMI 31.95 kg/m    Physical Exam   General Appearance:    Mildly obese male. Alert, cooperative, in no acute distress, appears stated age  Head:    Normocephalic, without obvious abnormality,  atraumatic  Eyes:    PERRL, conjunctiva/corneas clear, EOM's intact, fundi    benign, both eyes       Ears:    Normal TM's and external ear canals, both ears  Neck:   Supple, symmetrical, trachea midline, no adenopathy;       thyroid:  No enlargement/tenderness/nodules; no carotid   bruit or JVD  Back:     Symmetric, no curvature, ROM normal, no CVA tenderness  Lungs:     Clear to auscultation bilaterally, respirations unlabored  Chest wall:    No tenderness or deformity  Heart:    Normal heart rate. Normal rhythm. No murmurs, rubs, or gallops.  S1 and S2 normal  Abdomen:     Soft. Mild R sided tenderness. Medium sized umbilical hernia.   Genitalia:    deferred  Rectal:    deferred  Extremities:   All extremities are intact. No cyanosis or edema  Pulses:   2+ and symmetric all extremities  Skin:   Skin color, texture, turgor normal, no rashes or lesions  Lymph nodes:   Cervical, supraclavicular, and axillary nodes normal  Neurologic:   CNII-XII intact. Normal strength, sensation and reflexes      throughout     Last depression screening scores PHQ 2/9 Scores 11/14/2020 11/07/2019 11/07/2019  PHQ - 2 Score 0 0 0   Last fall risk screening Fall Risk  11/14/2020  Falls in the past year? 0  Number falls in past yr: 0  Injury with Fall? 0   Last Audit-C alcohol use screening Alcohol Use Disorder Test (AUDIT) 11/14/2020  1. How often do you have a drink containing alcohol? 0  2. How many drinks containing alcohol do you have on a typical day when you are drinking? 0  3. How often do you have six or more drinks on one occasion? 0  AUDIT-C Score 0  Alcohol Brief Interventions/Follow-up AUDIT Score <7 follow-up not indicated   A score of 3 or more in women, and 4 or more in men indicates increased  risk for alcohol abuse, EXCEPT if all of the points are from question 1     Assessment & Plan      1. SBO (small bowel obstruction) ([place]) Much improved, but not completely back to normal.  Has abdominal mri and follow up with dr pabon scheduled in the upcoming weeks.  Possibly recent episode of appendicitis or diverticulitis. Although did not see any mention of diverticuli on his last colonoscopy report.   2. Hypocalcemia May be secondary to nutritional intake, but not correct when checked last week. Will recheck along with PTH and vitamin D at follow up.   3. Hiatal hernia Currently treated sympotmatically with PPI. Being followed by Dr. Dahlia Byes  4. Adult hypothyroidism Is euthyroid.   5. Hyperlipidemia, unspecified hyperlipidemia type Diet controlled. With check with next lab draw.   6. Iron deficiency anemia, unspecified iron deficiency anemia type Hemoglobin now corrected.   7. Gastroesophageal reflux disease without esophagitis Continue - omeprazole (PRILOSEC) 20 MG capsule; TAKE 1 CAPSULE BY MOUTH TWICE DAILY BEFORE A MEAL  Dispense: 60 capsule; Refill: 12   8. Hypertension Office BP is elevated, but he reports consistently normal BP in the 120's/60's outside the office with brachial cuff. Continue current medications.      The entirety of the information documented in the History of Present Illness, Review of Systems and Physical Exam were personally obtained by me. Portions of this information were initially documented by the CMA and reviewed by me for thoroughness and accuracy.      Lelon Huh, MD  [facility] [phone] (phone) [phone] (fax)  [place]

## 2021-01-01 ENCOUNTER — Ambulatory Visit
Admission: RE | Admit: 2021-01-01 | Discharge: 2021-01-01 | Disposition: A | Discharge status: Home / Self Care | Payer: Medicare Other | Source: Ambulatory Visit | Attending: Surgery | Admitting: Surgery

## 2021-01-01 ENCOUNTER — Other Ambulatory Visit: Payer: Self-pay

## 2021-01-01 DIAGNOSIS — K439 Ventral hernia without obstruction or gangrene: Secondary | ICD-10-CM | POA: Diagnosis not present

## 2021-01-01 DIAGNOSIS — K5750 Diverticulosis of both small and large intestine without perforation or abscess without bleeding: Secondary | ICD-10-CM | POA: Diagnosis not present

## 2021-01-01 DIAGNOSIS — K429 Umbilical hernia without obstruction or gangrene: Secondary | ICD-10-CM | POA: Diagnosis not present

## 2021-01-01 DIAGNOSIS — K5732 Diverticulitis of large intestine without perforation or abscess without bleeding: Secondary | ICD-10-CM | POA: Insufficient documentation

## 2021-01-01 DIAGNOSIS — K449 Diaphragmatic hernia without obstruction or gangrene: Secondary | ICD-10-CM | POA: Diagnosis not present

## 2021-01-01 DIAGNOSIS — K56600 Partial intestinal obstruction, unspecified as to cause: Secondary | ICD-10-CM | POA: Diagnosis not present

## 2021-01-01 MED ORDER — GADOBUTROL 1 MMOL/ML IV SOLN
8.0000 mL | Freq: Once | INTRAVENOUS | Status: AC | PRN
  Administered 2021-01-01: 8 mL via INTRAVENOUS

## 2021-01-02 ENCOUNTER — Telehealth: Payer: Self-pay

## 2021-01-02 ENCOUNTER — Other Ambulatory Visit: Payer: Self-pay

## 2021-01-02 DIAGNOSIS — K5732 Diverticulitis of large intestine without perforation or abscess without bleeding: Secondary | ICD-10-CM

## 2021-01-02 NOTE — Telephone Encounter (Signed)
Pt called back to confirm appt with Dr. Dahlia Byes.

## 2021-01-02 NOTE — Telephone Encounter (Signed)
Per Dr.Pabon-referral placed to [facility] for consult -patient has scheduled appointment 01/16/21 @ 8:45 am with Dr.Pabon.

## 2021-01-02 NOTE — Telephone Encounter (Signed)
Patient scheduled 3/16/ @ 8:45 . Left message to call office to confirm the appointment.

## 2021-01-07 ENCOUNTER — Encounter: Payer: Self-pay | Admitting: *Deleted

## 2021-01-16 ENCOUNTER — Other Ambulatory Visit: Payer: Self-pay

## 2021-01-16 ENCOUNTER — Ambulatory Visit (INDEPENDENT_AMBULATORY_CARE_PROVIDER_SITE_OTHER): Payer: Medicare Other | Admitting: Surgery

## 2021-01-16 ENCOUNTER — Encounter: Payer: Self-pay | Admitting: Surgery

## 2021-01-16 VITALS — BP 162/79 | HR 86 | Temp 97.7°F | Ht 65.0 in | Wt 189.8 lb

## 2021-01-16 DIAGNOSIS — K5732 Diverticulitis of large intestine without perforation or abscess without bleeding: Secondary | ICD-10-CM | POA: Diagnosis not present

## 2021-01-16 NOTE — Progress Notes (Signed)
Outpatient Surgical Follow Up  01/16/2021  Christian Mora is an 75 y.o. male.   Chief Complaint  Patient presents with  . Follow-up    Hiatal hernia -discuss MRI    HPI: Randall Hiss is a 75 year old male well-known to me with history of right-sided diverticulitis and a small bowel diverticulosis that presented a few months    ago with partial small bowel obstruction.  He also has a reducible umbilical hernia.  Initially was thought that the small bowel obstruction worse related to the hernia but this clearly was not the case.  He completed antibiotic therapy and feels much better.  Today he comes for follow-up.  He denies any fevers any chills he is tolerating regular diet and having regular bowel movements.  He denies any abdominal pain. I have personally reviewed his CT scan from his hospitalization showing evidence of right sided colon inflammatory response.  There is no evidence of abscess.  I have also reviewed his records showing evidence of small bowel diverticulosis capsule endoscopy.  He has a large paraesophageal hernia that at this time is causing  mild symptoms, he has some ocassional dysphagia and epigastric pain. I have ordered an MR enteropgraphy which I have personally reviewed showing a large paraesophageal hernia and some periappendiceal inflammation can not r/o mucocele or mass.   Past Medical History:  Diagnosis Date  . Allergy   . Anemia   . Anxiety   . Diverticulitis   . GERD (gastroesophageal reflux disease)   . Hyperlipidemia   . Hypertension   . Hypothyroidism   . Melanoma of skin ([place]) 03/13/2003  . Thyroid disease     Past Surgical History:  Procedure Laterality Date  . COLONOSCOPY WITH PROPOFOL N/A 03/03/2018   Procedure: COLONOSCOPY WITH PROPOFOL;  Surgeon: Jonathon Bellows, MD;  Location[facility] ENDOSCOPY;  Service: Gastroenterology;  Laterality: N/A;  . ESOPHAGOGASTRODUODENOSCOPY (EGD) WITH PROPOFOL N/A 03/03/2018   Procedure: ESOPHAGOGASTRODUODENOSCOPY (EGD) WITH  PROPOFOL;  Surgeon: Jonathon Bellows, MD;  Location: [facility] ENDOSCOPY;  Service: Gastroenterology;  Laterality: N/A;  . GIVENS CAPSULE STUDY N/A 03/12/2018   Procedure: GIVENS CAPSULE STUDY;  Surgeon: Jonathon Bellows, MD;  Location: [facility] ENDOSCOPY;  Service: Gastroenterology;  Laterality: N/A;  . lymph node removed  2004  . TONSILLECTOMY AND ADENOIDECTOMY  1954    Family History  Problem Relation Age of Onset  . Hypertension Mother   . Cancer Father        prostate  . Diabetes Son     Social History:  reports that he has never smoked. He has never used smokeless tobacco. He reports that he does not drink alcohol and does not use drugs.  Allergies: No Known Allergies  Medications reviewed.    ROS Full ROS performed and is otherwise negative other than what is stated in HPI   BP (!) 162/79   Pulse 86   Temp 97.7 F (36.5 C) (Oral)   Ht 5\' 5"  (1.651 m)   Wt 189 lb 12.8 oz (86.1 kg)   SpO2 93%   BMI 31.58 kg/m   Physical Exam Vitals and nursing note reviewed. Exam conducted with a chaperone present.  Constitutional:      General: He is not in acute distress.    Appearance: Normal appearance. He is obese.  Eyes:     General: No scleral icterus.       Right eye: No discharge.        Left eye: No discharge.  Cardiovascular:     Rate  and Rhythm: Normal rate and regular rhythm.     Heart sounds: No murmur heard. No gallop.   Pulmonary:     Effort: Pulmonary effort is normal. No respiratory distress.     Breath sounds: Normal breath sounds. No stridor. No rales.  Abdominal:     General: Abdomen is flat. There is no distension.     Palpations: Abdomen is soft. There is no mass.     Tenderness: There is no guarding or rebound.     Hernia: A hernia is present.     Comments: He has two incisional hernias that are reducible one epigastric area and the other periumbilical  Musculoskeletal:        General: Normal range of motion.     Cervical back: Normal range of motion and neck  supple. No rigidity or tenderness.  Skin:    General: Skin is warm and dry.     Capillary Refill: Capillary refill takes less than 2 seconds.  Neurological:     General: No focal deficit present.     Mental Status: He is alert and oriented to person, place, and time.  Psychiatric:        Mood and Affect: Mood normal.        Behavior: Behavior normal.        Thought Content: Thought content normal.        Judgment: Judgment normal.        Assessment/Plan:  75 year old male with multiple medical issues from a surgical standpoint his got several problem  #1 is a large paraesophageal hernia that at this time some mild symptoms.  Patient is hesitant about potential surgical intervention.  I will definitely recommend further evaluation with an EGD given that he had some symptoms of some mild dysphagia. #2 asymptomatic incisional hernias.  At this time they are asymptomatic usual natural suggest repair in the elective setting at some point time.  Again patient is not keen to any surgical invention unless is absolutely a necessity #3 right lower quadrant inflammatory process questionable small bowel diverticulosis versus right-sided diverticulosis.  Cannot exclude a mucocele or other colonic mass.  I definitely think that this needs to be studied furthermore.  The minimal recommend is GI evaluation with colonoscopy.  Again the patient is hesitant about interventions but he has agreed to see GI.  I think that he wishes to have a colonoscopy probably at some point time on May.  I spent significant amount of time today mainly with counseling the patient about all of his problems.  He seems to be in agreement with the proposed plan.  There is no need for emergent surgical intervention.  I will see him after he completes endoscopic evaluation.  He may need a resection depending on endoscopic findings   Greater than 50% of the 40 minutes  visit was spent in counseling/coordination of care   Caroleen Hamman, MD [facility]

## 2021-01-16 NOTE — Patient Instructions (Addendum)
Referral sent to [facility] gastroenterology. Someone from their office will contact you to schedule an appointment. If you do not hear from anyone within 5-7 days please call our office and let us know so we can check on this.   Please see your follow up appointment listed below. Be sure to have the EGD and Colonoscopy done prior to this appointment.

## 2021-02-04 ENCOUNTER — Ambulatory Visit: Payer: Self-pay | Admitting: Surgery

## 2021-02-07 DIAGNOSIS — H43813 Vitreous degeneration, bilateral: Secondary | ICD-10-CM | POA: Diagnosis not present

## 2021-02-12 DIAGNOSIS — Z23 Encounter for immunization: Secondary | ICD-10-CM | POA: Diagnosis not present

## 2021-03-11 DIAGNOSIS — X32XXXA Exposure to sunlight, initial encounter: Secondary | ICD-10-CM | POA: Diagnosis not present

## 2021-03-11 DIAGNOSIS — D485 Neoplasm of uncertain behavior of skin: Secondary | ICD-10-CM | POA: Diagnosis not present

## 2021-03-11 DIAGNOSIS — D2271 Melanocytic nevi of right lower limb, including hip: Secondary | ICD-10-CM | POA: Diagnosis not present

## 2021-03-11 DIAGNOSIS — D2261 Melanocytic nevi of right upper limb, including shoulder: Secondary | ICD-10-CM | POA: Diagnosis not present

## 2021-03-11 DIAGNOSIS — L80 Vitiligo: Secondary | ICD-10-CM | POA: Diagnosis not present

## 2021-03-11 DIAGNOSIS — D0461 Carcinoma in situ of skin of right upper limb, including shoulder: Secondary | ICD-10-CM | POA: Diagnosis not present

## 2021-03-11 DIAGNOSIS — L570 Actinic keratosis: Secondary | ICD-10-CM | POA: Diagnosis not present

## 2021-03-11 DIAGNOSIS — Z85820 Personal history of malignant melanoma of skin: Secondary | ICD-10-CM | POA: Diagnosis not present

## 2021-03-11 DIAGNOSIS — Z85828 Personal history of other malignant neoplasm of skin: Secondary | ICD-10-CM | POA: Diagnosis not present

## 2021-03-11 DIAGNOSIS — L400 Psoriasis vulgaris: Secondary | ICD-10-CM | POA: Diagnosis not present

## 2021-03-12 ENCOUNTER — Other Ambulatory Visit: Payer: Self-pay

## 2021-03-12 ENCOUNTER — Encounter: Payer: Self-pay | Admitting: Gastroenterology

## 2021-03-12 ENCOUNTER — Ambulatory Visit (INDEPENDENT_AMBULATORY_CARE_PROVIDER_SITE_OTHER): Payer: Medicare Other | Admitting: Gastroenterology

## 2021-03-12 VITALS — BP 177/82 | HR 90 | Ht 65.0 in | Wt 192.2 lb

## 2021-03-12 DIAGNOSIS — R9389 Abnormal findings on diagnostic imaging of other specified body structures: Secondary | ICD-10-CM

## 2021-03-12 NOTE — Progress Notes (Signed)
Christian Bellows MD, MRCP(U.K) [address]  [place]  [place], [place] [address]  Main: [phone]  Fax: [phone]   Gastroenterology Consultation  Referring Provider:     Jules Husbands, MD Primary Care Physician:  Birdie Sons, MD Primary Gastroenterologist:  Dr. Jonathon Mora  Reason for Consultation:     EGD+colonoscopy        HPI:   Christian Mora is a 75 y.o. y/o male referred for consultation & management  by Dr. Dahlia Byes for EGD+colonoscopy.Seen him recently for right sided diverticulosis and small bowel diverticulosis with h/o obstruction in 10/2020 , reducible umbilical hernia , Treated for possible diverticulitis. Found to have a large paraesophageal hernia, periappendicular inflammation.  He has been referred for EGD due to hernia and rule out mucocele vs any colonic mass.   I have previously seen him back in 2019 for iron deficiency anemia , was on advil at that time.  03/2018 : EGD+colonoscopy 4 sessile polyps in colon resected Tubular adenomas x4 and villous adenoma x 2, duodenitis on EGD , 10 cm hiatal hernia noted.Capsule study showed small bowel diverticulosis .    01/02/2021: MR Enterogram : Dilated appendix , small bowel diverticulosis, large hiatal hernia and umbilical hernia.  11/21/2020: Hb 13.4 grams  He denies any GI symptoms such as dysphagia, abdominal pain, change in bowel habits.  No unintentional weight loss Past Medical History:  Diagnosis Date  . Allergy   . Anemia   . Anxiety   . Diverticulitis   . GERD (gastroesophageal reflux disease)   . Hyperlipidemia   . Hypertension   . Hypothyroidism   . Melanoma of skin ([place]) 03/13/2003  . Thyroid disease     Past Surgical History:  Procedure Laterality Date  . COLONOSCOPY WITH PROPOFOL N/A 03/03/2018   Procedure: COLONOSCOPY WITH PROPOFOL;  Surgeon: Christian Bellows, MD;  Location: [facility] ENDOSCOPY;  Service: Gastroenterology;  Laterality: N/A;  . ESOPHAGOGASTRODUODENOSCOPY (EGD) WITH PROPOFOL N/A  03/03/2018   Procedure: ESOPHAGOGASTRODUODENOSCOPY (EGD) WITH PROPOFOL;  Surgeon: Christian Bellows, MD;  Location: [facility] ENDOSCOPY;  Service: Gastroenterology;  Laterality: N/A;  . GIVENS CAPSULE STUDY N/A 03/12/2018   Procedure: GIVENS CAPSULE STUDY;  Surgeon: Christian Bellows, MD;  Location: [facility] ENDOSCOPY;  Service: Gastroenterology;  Laterality: N/A;  . lymph node removed  2004  . [place]    Prior to Admission medications   Medication Sig Start Date End Date Taking? Authorizing Provider  ALPRAZolam (XANAX) 0.5 MG tablet 1/2 TO 1 TABLET BY MOUTH 3 TIMES DAILY AS NEEDED Patient taking differently: Take 0.25-0.5 mg by mouth 3 (three) times daily as needed for anxiety. 03/21/20   Birdie Sons, MD  amLODipine (NORVASC) 10 MG tablet TAKE 1 TABLET BY MOUTH DAILY Patient taking differently: Take 10 mg by mouth daily. 08/21/20   Birdie Sons, MD  augmented betamethasone dipropionate (DIPROLENE-AF) 0.05 % cream Apply 1 application topically 2 (two) times daily as needed (rash). 09/02/19   [provider]  Bacillus Coagulans-Inulin (PROBIOTIC) 1-250 BILLION-MG CAPS Take 1 capsule by mouth daily at 6 (six) AM. 10/24/20   [provider]  diclofenac sodium (VOLTAREN) 1 % GEL Apply 4 g topically 4 (four) times daily as needed. Patient taking differently: Apply 4 g topically 4 (four) times daily as needed (pain). 11/08/18   Birdie Sons, MD  fluticasone (FLONASE) 50 MCG/ACT nasal spray SPRAY TWICE INTO EACH NOSTRIL ONCE DAILY Patient taking differently: Place 2 sprays into both nostrils daily. 03/21/20  Birdie Sons, MD  hydrocortisone (ANUSOL-HC) 2.5 % rectal cream APPLY TO AFFECTED AREAS 2 OR 3 TIMES DAILY AS NEEDED Patient taking differently: Place 1 application rectally 3 (three) times daily as needed for hemorrhoids or anal itching. 06/25/20   Birdie Sons, MD  ibuprofen (ADVIL,MOTRIN) 200 MG tablet Take 200 mg by mouth every 6 (six) hours as needed  for fever, headache or mild pain.    [provider]  levothyroxine (SYNTHROID) 137 MCG tablet TAKE 1 TABLET BY MOUTH DAILY BEFORE BREAKFAST Patient taking differently: Take 137 mcg by mouth daily before breakfast. 03/22/20   Birdie Sons, MD  Multiple Vitamins-Iron (MULTIVITAMIN/IRON PO) Take 1 tablet by mouth daily at 6 (six) AM.    [provider]  omeprazole (PRILOSEC) 20 MG capsule TAKE 1 CAPSULE BY MOUTH TWICE DAILY BEFORE A MEAL 12/03/20   Birdie Sons, MD  tamsulosin (FLOMAX) 0.4 MG CAPS capsule Take 1 capsule (0.4 mg total) by mouth daily. 06/25/20   Birdie Sons, MD    Family History  Problem Relation Age of Onset  . Hypertension Mother   . Cancer Father        prostate  . Diabetes Son      Social History   Tobacco Use  . Smoking status: Never Smoker  . Smokeless tobacco: Never Used  Vaping Use  . Vaping Use: Never used  Substance Use Topics  . Alcohol use: No  . Drug use: No    Allergies as of 03/12/2021  . (No Known Allergies)    Review of Systems:    All systems reviewed and negative except where noted in HPI.   Physical Exam:  BP (!) 177/82 (BP Location: Left Arm, Patient Position: Sitting, Cuff Size: Normal)   Pulse 90   Ht 5\' 5"  (1.651 m)   Wt 192 lb 3.2 oz (87.2 kg)   BMI 31.98 kg/m  No LMP for male patient. Psych:  Alert and cooperative. Normal mood and affect. General:   Alert,  Well-developed, well-nourished, pleasant and cooperative in NAD Head:  Normocephalic and atraumatic. Heart:  Regular rate and rhythm; no murmurs, clicks, rubs, or gallops. Abdomen:  Normal bowel sounds.  No bruits.  Soft, non-tender and non-distended without masses, hepatosplenomegaly or hernias noted.  No guarding or rebound tenderness.    Neurologic:  Alert and oriented x3;  grossly normal neurologically. Psych:  Alert and cooperative. Normal mood and affect.  Imaging Studies: No results found.  Assessment and Plan:   Christian Mora is a  75 y.o. y/o male has been referred for EGD and colonoscopy by Dr Dahlia Byes. Recent admission in 10/2020 for rt colon diverticulitis and ? SBO. Large hernia noted on recent MRI and concerns for possible appendicular mucocele. EGD+colonoscopy in 2019 revealed hiatal hernia and diverticulosis.    Plan  1. EGD+colonoscopy    I have discussed alternative options, risks & benefits,  which include, but are not limited to, bleeding, infection, perforation,respiratory complication & drug reaction.  The patient agrees with this plan & written consent will be obtained.     Follow up as needed   Dr Christian Bellows MD,MRCP(U.K)

## 2021-03-18 ENCOUNTER — Ambulatory Visit: Payer: Medicare Other | Admitting: Gastroenterology

## 2021-03-20 ENCOUNTER — Ambulatory Visit: Payer: Self-pay | Admitting: Surgery

## 2021-03-27 ENCOUNTER — Ambulatory Visit: Payer: Self-pay | Admitting: Family Medicine

## 2021-04-11 DIAGNOSIS — D0461 Carcinoma in situ of skin of right upper limb, including shoulder: Secondary | ICD-10-CM | POA: Diagnosis not present

## 2021-04-23 ENCOUNTER — Other Ambulatory Visit: Payer: Self-pay | Admitting: Family Medicine

## 2021-04-29 ENCOUNTER — Telehealth: Payer: Self-pay | Admitting: Gastroenterology

## 2021-04-29 NOTE — Telephone Encounter (Signed)
Please call to reschedule procedure  

## 2021-04-30 NOTE — Telephone Encounter (Signed)
Called patient back and he stated that he wants to cancel his procedure-colonoscopy at this time. I asked him if he wanted to reschedule and he stated that he did not. He stated that he would call us back whenever he was ready to reschedule. I then called the endo unit and cancelled his procedure.

## 2021-05-07 ENCOUNTER — Other Ambulatory Visit: Payer: Self-pay | Admitting: Family Medicine

## 2021-05-07 ENCOUNTER — Ambulatory Visit: Payer: Medicare Other | Admitting: Gastroenterology

## 2021-05-07 DIAGNOSIS — H6981 Other specified disorders of Eustachian tube, right ear: Secondary | ICD-10-CM

## 2021-05-20 ENCOUNTER — Ambulatory Visit: Payer: Self-pay | Admitting: Surgery

## 2021-05-21 ENCOUNTER — Ambulatory Visit: Admit: 2021-05-21 | Payer: Medicare Other | Admitting: Gastroenterology

## 2021-05-21 SURGERY — COLONOSCOPY WITH PROPOFOL
Anesthesia: General

## 2021-05-29 ENCOUNTER — Ambulatory Visit: Payer: Medicare Other | Admitting: Surgery

## 2021-05-31 ENCOUNTER — Ambulatory Visit: Payer: Medicare Other | Admitting: Family Medicine

## 2021-07-05 ENCOUNTER — Other Ambulatory Visit: Payer: Self-pay | Admitting: Family Medicine

## 2021-07-05 DIAGNOSIS — K649 Unspecified hemorrhoids: Secondary | ICD-10-CM

## 2021-07-05 NOTE — Telephone Encounter (Signed)
Requested medication (s) are due for refill today -expired Rx  Requested medication (s) are on the active medication list -yes  Future visit scheduled -no  Last refill: 06/25/20 30g 5RF  Notes to clinic: Request RF: expired Rx, not assigned protocol  Requested Prescriptions  Pending Prescriptions Disp Refills   hydrocortisone (ANUSOL-HC) 2.5 % rectal cream [Pharmacy Med Name: HYDROCORTISONE (PERIANAL) 2.5% TOP] 30 g 5    Sig: APPLY TO AFFECTED AREAS 2 TO 3 TIMES DAILY AS NEEDED     Off-Protocol Failed - 07/05/2021 11:06 AM      Failed - Medication not assigned to a protocol, review manually.      Passed - Valid encounter within last 12 months    Recent Outpatient Visits           7 months ago SBO (small bowel obstruction) ([place])   [facility] Birdie Sons, MD   8 months ago Abdominal pain, acute, epigastric   [place], Vickki Muff, PA-C   1 year ago Essential (primary) hypertension   [facility], Kirstie Peri, MD   1 year ago Essential (primary) hypertension   [facility], Kirstie Peri, MD   2 years ago Ear pain, left   [facility] Birdie Sons, MD             Over the Counter:  OTC Passed - 07/05/2021 11:06 AM      Passed - Valid encounter within last 12 months    Recent Outpatient Visits           7 months ago SBO (small bowel obstruction) [facility])   [facility] Birdie Sons, MD   8 months ago Abdominal pain, acute, epigastric   [place], Vickki Muff, PA-C   1 year ago Essential (primary) hypertension   [facility], Kirstie Peri, MD   1 year ago Essential (primary) hypertension   [facility] Birdie Sons, MD   2 years ago Ear pain, left   [facility] Birdie Sons, MD              Signed Prescriptions Disp Refills   levothyroxine (SYNTHROID) 137 MCG tablet 90 tablet 0     Sig: TAKE ONE TABLET [place]     Endocrinology:  Hypothyroid Agents Failed - 07/05/2021 11:06 AM      Failed - TSH needs to be rechecked within 3 months after an abnormal result. Refill until TSH is due.      Passed - TSH in normal range and within 360 days    TSH  Date Value Ref Range Status  11/21/2020 3.170 0.450 - 4.500 uIU/mL Final          Passed - Valid encounter within last 12 months    Recent Outpatient Visits           7 months ago SBO (small bowel obstruction) [facility])   [facility] Birdie Sons, MD   8 months ago Abdominal pain, acute, epigastric   [place], Vickki Muff, PA-C   1 year ago Essential (primary) hypertension   [facility] Birdie Sons, MD   1 year ago Essential (primary) hypertension   [facility] Birdie Sons, MD   2 years ago Ear pain, left   [facility] Birdie Sons, MD  Requested Prescriptions  Pending Prescriptions Disp Refills   hydrocortisone (ANUSOL-HC) 2.5 % rectal cream [Pharmacy Med Name: HYDROCORTISONE (PERIANAL) 2.5% TOP] 30 g 5    Sig: APPLY TO AFFECTED AREAS 2 TO 3 TIMES DAILY AS NEEDED     Off-Protocol Failed - 07/05/2021 11:06 AM      Failed - Medication not assigned to a protocol, review manually.      Passed - Valid encounter within last 12 months    Recent Outpatient Visits           7 months ago SBO (small bowel obstruction) ([place])   [facility] Birdie Sons, MD   8 months ago Abdominal pain, acute, epigastric   [place], Vickki Muff, PA-C   1 year ago Essential (primary) hypertension   [facility], Kirstie Peri, MD   1 year ago Essential (primary) hypertension   [facility], Kirstie Peri, MD   2 years ago Ear pain, left   [facility] Birdie Sons, MD             Over the  Counter:  OTC Passed - 07/05/2021 11:06 AM      Passed - Valid encounter within last 12 months    Recent Outpatient Visits           7 months ago SBO (small bowel obstruction) [facility])   [facility] Birdie Sons, MD   8 months ago Abdominal pain, acute, epigastric   [place], Vickki Muff, PA-C   1 year ago Essential (primary) hypertension   [facility], Kirstie Peri, MD   1 year ago Essential (primary) hypertension   [facility] Birdie Sons, MD   2 years ago Ear pain, left   [facility] Birdie Sons, MD              Signed Prescriptions Disp Refills   levothyroxine (SYNTHROID) 137 MCG tablet 90 tablet 0    Sig: TAKE ONE TABLET [place]     Endocrinology:  Hypothyroid Agents Failed - 07/05/2021 11:06 AM      Failed - TSH needs to be rechecked within 3 months after an abnormal result. Refill until TSH is due.      Passed - TSH in normal range and within 360 days    TSH  Date Value Ref Range Status  11/21/2020 3.170 0.450 - 4.500 uIU/mL Final          Passed - Valid encounter within last 12 months    Recent Outpatient Visits           7 months ago SBO (small bowel obstruction) [facility])   [facility] Birdie Sons, MD   8 months ago Abdominal pain, acute, epigastric   [place], Vickki Muff, PA-C   1 year ago Essential (primary) hypertension   [facility] Birdie Sons, MD   1 year ago Essential (primary) hypertension   [facility] Birdie Sons, MD   2 years ago Ear pain, left   [facility] Birdie Sons, MD

## 2021-07-17 ENCOUNTER — Other Ambulatory Visit: Payer: Self-pay | Admitting: Family Medicine

## 2021-07-24 ENCOUNTER — Other Ambulatory Visit: Payer: Self-pay | Admitting: Family Medicine

## 2021-07-24 DIAGNOSIS — N401 Enlarged prostate with lower urinary tract symptoms: Secondary | ICD-10-CM

## 2021-07-24 DIAGNOSIS — R3911 Hesitancy of micturition: Secondary | ICD-10-CM

## 2021-08-29 ENCOUNTER — Other Ambulatory Visit: Payer: Self-pay

## 2021-08-29 ENCOUNTER — Encounter: Payer: Self-pay | Admitting: Emergency Medicine

## 2021-08-29 ENCOUNTER — Ambulatory Visit
Admission: RE | Admit: 2021-08-29 | Discharge: 2021-08-29 | Disposition: A | Discharge status: Home / Self Care | Payer: Medicare Other | Attending: Emergency Medicine | Admitting: Emergency Medicine

## 2021-08-29 ENCOUNTER — Ambulatory Visit
Admission: EM | Admit: 2021-08-29 | Discharge: 2021-08-29 | Disposition: A | Discharge status: Home / Self Care | Payer: Medicare Other | Attending: Emergency Medicine | Admitting: Emergency Medicine

## 2021-08-29 ENCOUNTER — Ambulatory Visit
Admission: RE | Admit: 2021-08-29 | Discharge: 2021-08-29 | Disposition: A | Discharge status: Home / Self Care | Payer: Medicare Other | Source: Ambulatory Visit | Attending: Emergency Medicine | Admitting: Emergency Medicine

## 2021-08-29 ENCOUNTER — Telehealth: Payer: Self-pay

## 2021-08-29 DIAGNOSIS — I10 Essential (primary) hypertension: Secondary | ICD-10-CM | POA: Diagnosis not present

## 2021-08-29 DIAGNOSIS — R0602 Shortness of breath: Secondary | ICD-10-CM

## 2021-08-29 DIAGNOSIS — R0989 Other specified symptoms and signs involving the circulatory and respiratory systems: Secondary | ICD-10-CM | POA: Diagnosis not present

## 2021-08-29 DIAGNOSIS — J9811 Atelectasis: Secondary | ICD-10-CM | POA: Diagnosis not present

## 2021-08-29 DIAGNOSIS — J189 Pneumonia, unspecified organism: Secondary | ICD-10-CM

## 2021-08-29 DIAGNOSIS — R059 Cough, unspecified: Secondary | ICD-10-CM | POA: Diagnosis not present

## 2021-08-29 DIAGNOSIS — R051 Acute cough: Secondary | ICD-10-CM

## 2021-08-29 MED ORDER — AZITHROMYCIN 250 MG PO TABS: 250.0000 mg | ORAL_TABLET | Freq: Every day | ORAL | 0 refills | Status: DC

## 2021-08-29 MED ORDER — BENZONATATE 100 MG PO CAPS: 100.0000 mg | ORAL_CAPSULE | Freq: Three times a day (TID) | ORAL | 0 refills | Status: DC | PRN

## 2021-08-29 MED ORDER — AMOXICILLIN 875 MG PO TABS: 875.0000 mg | ORAL_TABLET | Freq: Two times a day (BID) | ORAL | 0 refills | Status: DC

## 2021-08-29 MED ORDER — PREDNISONE 10 MG PO TABS: 40.0000 mg | ORAL_TABLET | Freq: Every day | ORAL | 0 refills | Status: DC

## 2021-08-29 NOTE — ED Provider Notes (Signed)
Christian Mora    CSN: [id number] Arrival date & time: 08/29/21  4562      History   Chief Complaint Chief Complaint  Patient presents with   Cough     HPI Christian Mora is a 75 y.o. male.  Patient presents with productive cough and mild shortness of breath x1 week.  No fever, chills, ear pain, sore throat, vomiting, diarrhea, or other symptoms.  Treatment at home with Mucinex DM, Tylenol, ibuprofen.  His medical history includes hypertension, thyroid disease, melanoma, diverticulitis, small bowel obstruction, fatty liver, seasonal allergies.  The history is provided by the patient and medical records.   Past Medical History:  Diagnosis Date   Allergy    Anemia    Anxiety    Diverticulitis    GERD (gastroesophageal reflux disease)    Hyperlipidemia    Hypertension    Hypothyroidism    Melanoma of skin ([place]) 03/13/2003   Thyroid disease     Patient Active Problem List   Diagnosis Date Noted   Abnormal CT scan 03/12/2021   Hiatal hernia 56/38/9373   Umbilical hernia without obstruction and without gangrene 12/03/2020   SBO (small bowel obstruction) ([place]) 10/16/2020   History of adenomatous polyp of colon 03/09/2018   Iron deficiency anemia 01/26/2018   Urinary hesitancy 02/25/2017   Fatty infiltration of liver 07/30/2015   Hepatic cyst 07/30/2015   Essential (primary) hypertension 02/18/2010   Allergic rhinitis 02/22/2009   BPH (benign prostatic hyperplasia) 10/06/2007   HLD (hyperlipidemia) 02/23/2007   Anxiety 12/15/2006   Acid reflux 11/04/2003   Adult hypothyroidism 07/03/1989    Past Surgical History:  Procedure Laterality Date   COLONOSCOPY WITH PROPOFOL N/A 03/03/2018   Procedure: COLONOSCOPY WITH PROPOFOL;  Surgeon: Jonathon Bellows, MD;  Location: [facility] ENDOSCOPY;  Service: Gastroenterology;  Laterality: N/A;   ESOPHAGOGASTRODUODENOSCOPY (EGD) WITH PROPOFOL N/A 03/03/2018   Procedure: ESOPHAGOGASTRODUODENOSCOPY (EGD) WITH PROPOFOL;  Surgeon: Jonathon Bellows, MD;  Location: [facility] ENDOSCOPY;  Service: Gastroenterology;  Laterality: N/A;   GIVENS CAPSULE STUDY N/A 03/12/2018   Procedure: GIVENS CAPSULE STUDY;  Surgeon: Jonathon Bellows, MD;  Location: [facility] ENDOSCOPY;  Service: Gastroenterology;  Laterality: N/A;   lymph node removed  2004   [place] Medications    Prior to Admission medications   Medication Sig Start Date End Date Taking? Authorizing Provider  amoxicillin (AMOXIL) 875 MG tablet Take 1 tablet (875 mg total) by mouth 2 (two) times daily for 7 days. 08/29/21 09/05/21 Yes Sharion Balloon, NP  azithromycin (ZITHROMAX) 250 MG tablet Take 1 tablet (250 mg total) by mouth daily. Take first 2 tablets together, then 1 every day until finished. 08/29/21  Yes Sharion Balloon, NP  benzonatate (TESSALON) 100 MG capsule Take 1 capsule (100 mg total) by mouth 3 (three) times daily as needed for cough. 08/29/21  Yes Sharion Balloon, NP  predniSONE (DELTASONE) 10 MG tablet Take 4 tablets (40 mg total) by mouth daily for 5 days. 08/29/21 09/03/21 Yes Sharion Balloon, NP  ALPRAZolam (XANAX) 0.5 MG tablet 1/2 TO 1 TABLET BY MOUTH 3 TIMES DAILY AS NEEDED Patient taking differently: Take 0.25-0.5 mg by mouth 3 (three) times daily as needed for anxiety. 03/21/20   Birdie Sons, MD  amLODipine (NORVASC) 10 MG tablet TAKE 1 TABLET BY MOUTH DAILY Patient taking differently: Take 10 mg by mouth daily. 08/21/20   Birdie Sons, MD  augmented betamethasone dipropionate (DIPROLENE-AF)  0.05 % cream Apply 1 application topically 2 (two) times daily as needed (rash). 09/02/19   [provider]  Bacillus Coagulans-Inulin (PROBIOTIC) 1-250 BILLION-MG CAPS Take 1 capsule by mouth daily at 6 (six) AM. 10/24/20   [provider]  diclofenac sodium (VOLTAREN) 1 % GEL Apply 4 g topically 4 (four) times daily as needed. Patient taking differently: Apply 4 g topically 4 (four) times daily as needed (pain). 11/08/18    Birdie Sons, MD  fluticasone (FLONASE) 50 MCG/ACT nasal spray Place 2 sprays into both nostrils daily. 05/07/21   Birdie Sons, MD  hydrocortisone (ANUSOL-HC) 2.5 % rectal cream APPLY TO AFFECTED AREAS 2 TO 3 TIMES DAILY AS NEEDED 07/05/21   Birdie Sons, MD  ibuprofen (ADVIL,MOTRIN) 200 MG tablet Take 200 mg by mouth every 6 (six) hours as needed for fever, headache or mild pain.    [provider]  levothyroxine (SYNTHROID) 137 MCG tablet TAKE ONE TABLET EACH MORNING BEFORE BREAKFAST 07/17/21   Birdie Sons, MD  Multiple Vitamins-Iron (MULTIVITAMIN/IRON PO) Take 1 tablet by mouth daily at 6 (six) AM.    [provider]  omeprazole (PRILOSEC) 20 MG capsule TAKE 1 CAPSULE BY MOUTH TWICE DAILY BEFORE A MEAL 12/03/20   Birdie Sons, MD  tamsulosin (FLOMAX) 0.4 MG CAPS capsule TAKE 1 CAPSULE EVERY DAY 07/24/21   Birdie Sons, MD    Family History Family History  Problem Relation Age of Onset   Hypertension Mother    Cancer Father        prostate   Diabetes Son     Social History Social History   Tobacco Use   Smoking status: Never   Smokeless tobacco: Never  Vaping Use   Vaping Use: Never used  Substance Use Topics   Alcohol use: No   Drug use: No     Allergies   Patient has no known allergies.   Review of Systems Review of Systems  Constitutional:  Negative for chills and fever.  HENT:  Negative for ear pain and sore throat.   Respiratory:  Positive for cough and shortness of breath.   Cardiovascular:  Negative for chest pain and palpitations.  Gastrointestinal:  Negative for abdominal pain, diarrhea and vomiting.  Skin:  Negative for color change and rash.  All other systems reviewed and are negative.   Physical Exam Triage Vital Signs ED Triage Vitals  Enc Vitals Group     BP      Pulse      Resp      Temp      Temp src      SpO2      Weight      Height      Head Circumference      Peak Flow      Pain Score      Pain  Loc      Pain Edu?      Excl. in [place]?    No data found.  Updated Vital Signs BP (!) 178/98 (BP Location: Right Arm)   Pulse 80   Temp 98.1 F (36.7 C) (Oral)   Resp 16   Ht 5\' 4"  (1.626 m)   Wt 194 lb (88 kg)   SpO2 95%   BMI 33.30 kg/m   Visual Acuity Right Eye Distance:   Left Eye Distance:   Bilateral Distance:    Right Eye Near:   Left Eye Near:    Bilateral Near:  Physical Exam Vitals and nursing note reviewed.  Constitutional:      General: He is not in acute distress.    Appearance: He is well-developed.  HENT:     Head: Normocephalic and atraumatic.     Right Ear: Tympanic membrane normal.     Left Ear: Tympanic membrane normal.     Nose: Nose normal.     Mouth/Throat:     Mouth: Mucous membranes are moist.     Pharynx: Oropharynx is clear.  Eyes:     Conjunctiva/sclera: Conjunctivae normal.  Cardiovascular:     Rate and Rhythm: Normal rate and regular rhythm.     Heart sounds: Normal heart sounds.  Pulmonary:     Effort: Pulmonary effort is normal. No respiratory distress.     Breath sounds: Wheezing and rhonchi present.  Abdominal:     Palpations: Abdomen is soft.     Tenderness: There is no abdominal tenderness.  Musculoskeletal:     Cervical back: Neck supple.  Skin:    General: Skin is warm and dry.  Neurological:     General: No focal deficit present.     Mental Status: He is alert and oriented to person, place, and time.     Gait: Gait normal.  Psychiatric:        Mood and Affect: Mood normal.        Behavior: Behavior normal.     UC Treatments / Results  Labs (all labs ordered are listed, but only abnormal results are displayed) Labs Reviewed - No data to display  EKG   Radiology DG Chest 2 View  Result Date: 08/29/2021 CLINICAL DATA:  Pt states cough, congestion and SOB for 9 days. History of HTN. Shielded.cough, SOB, rhonchi. EXAM: CHEST - 2 VIEW COMPARISON:  None. FINDINGS: Normal cardiac silhouette. Mild basilar linear  atelectasis. No effusion, infiltrate pneumothorax. Surgical clips in the RIGHT axilla. Degenerative osteophytosis of the spine. IMPRESSION: Mild basilar atelectasis.  No infiltrate or edema Electronically Signed   By: Suzy Bouchard M.D.   On: 08/29/2021 09:48    Procedures Procedures (including critical care time)  Medications Ordered in UC Medications - No data to display  Initial Impression / Assessment and Plan / UC Course  I have reviewed the triage vital signs and the nursing notes.  Pertinent labs & imaging results that were available during my care of the patient were reviewed by me and considered in my medical decision making (see chart for details).   Pneumonia, Cough, SOB, Elevated blood pressure with HTN.  Chest x-ray shows mild basilar atelectasis.  No infiltrate or edema.   Based on patient's exam today, including his lung sounds, treating with amoxicillin, Zithromax, and prednisone.  Treating cough with Tessalon Perles.  Instructed patient to follow-up with his PCP on Monday.  ED precautions discussed.  Also discussed that his blood pressure is elevated today and needs to be rechecked by his PCP in 1 to 2 weeks.  He agrees to plan of care.     Final Clinical Impressions(s) / UC Diagnoses   Final diagnoses:  SOB (shortness of breath)  Acute cough  Elevated blood pressure reading in office with diagnosis of hypertension  Pneumonia due to infectious organism, unspecified laterality, unspecified part of lung     Discharge Instructions      Go to [facility] for your x-ray.    [address]  [place], [place] [address] [phone]  I will call you  with the results.    Your blood pressure is elevated today at 188/89.  Please have this rechecked by your primary care provider in 1-2 weeks.          ED Prescriptions     Medication Sig Dispense Auth. Provider   predniSONE (DELTASONE) 10 MG tablet Take 4 tablets  (40 mg total) by mouth daily for 5 days. 20 tablet Sharion Balloon, NP   azithromycin (ZITHROMAX) 250 MG tablet Take 1 tablet (250 mg total) by mouth daily. Take first 2 tablets together, then 1 every day until finished. 6 tablet Sharion Balloon, NP   amoxicillin (AMOXIL) 875 MG tablet Take 1 tablet (875 mg total) by mouth 2 (two) times daily for 7 days. 14 tablet Sharion Balloon, NP   benzonatate (TESSALON) 100 MG capsule Take 1 capsule (100 mg total) by mouth 3 (three) times daily as needed for cough. 21 capsule Sharion Balloon, NP      PDMP not reviewed this encounter.   Sharion Balloon, NP 08/29/21 [phone]

## 2021-08-29 NOTE — Discharge Instructions (Addendum)
Go to [facility] for your x-ray.    [address]  [place], [place] [address] [phone]  I will call you with the results.    Your blood pressure is elevated today at 188/89.  Please have this rechecked by your primary care provider in 1-2 weeks.

## 2021-08-29 NOTE — ED Triage Notes (Signed)
Patient c/o of persistent cough x 1 week with mucus slight fever during this week.   Denied: dizziness n/v  Otc: mucinex, tylenol and Ibuprofen

## 2021-08-29 NOTE — Telephone Encounter (Signed)
Copied from [place] [id number]. Topic: Appointment Scheduling - Scheduling Inquiry for Clinic >> Aug 28, 2021  3:59 PM Lenon Curt, Rana Snare A wrote: Reason for CRM: The patient would like to be seen sooner than 09/03/21 at 10:40  The agent was unable to successfully reschedule the patient's virtual visit at the time of call  Please contact further to discuss

## 2021-08-30 ENCOUNTER — Telehealth: Payer: Self-pay

## 2021-08-30 NOTE — Telephone Encounter (Signed)
Copied from [place] [id number]. Topic: Appointment Scheduling - Scheduling Inquiry for Clinic >> Aug 30, 2021 10:36 AM Alanda Slim E wrote: Reason for CRM: Pt was seen at [facility] yesterday and advised to see Dr. Caryn Section in office / Pt has a virtual appt scheduled for next week but was told he needs Dr. Caryn Section to see him in office/ please advise if appt can be changed to in office

## 2021-08-30 NOTE — Telephone Encounter (Signed)
Yes, can change to in-person visit.

## 2021-08-30 NOTE — Telephone Encounter (Signed)
Please advise 

## 2021-08-30 NOTE — Telephone Encounter (Signed)
Tried calling patient. Left message to call back. OK for PEC to advise.

## 2021-08-30 NOTE — Telephone Encounter (Signed)
Pt. Given message from Dr. Caryn Section.Verbalizes understanding.

## 2021-09-03 ENCOUNTER — Encounter: Payer: Self-pay | Admitting: Family Medicine

## 2021-09-03 ENCOUNTER — Other Ambulatory Visit: Payer: Self-pay

## 2021-09-03 ENCOUNTER — Telehealth: Payer: Self-pay

## 2021-09-03 ENCOUNTER — Ambulatory Visit (INDEPENDENT_AMBULATORY_CARE_PROVIDER_SITE_OTHER): Payer: Medicare Other | Admitting: Family Medicine

## 2021-09-03 VITALS — BP 178/98 | HR 80 | Temp 98.0°F | Wt 193.0 lb

## 2021-09-03 DIAGNOSIS — E785 Hyperlipidemia, unspecified: Secondary | ICD-10-CM | POA: Diagnosis not present

## 2021-09-03 DIAGNOSIS — I10 Essential (primary) hypertension: Secondary | ICD-10-CM

## 2021-09-03 DIAGNOSIS — D509 Iron deficiency anemia, unspecified: Secondary | ICD-10-CM

## 2021-09-03 DIAGNOSIS — E039 Hypothyroidism, unspecified: Secondary | ICD-10-CM | POA: Diagnosis not present

## 2021-09-03 DIAGNOSIS — J189 Pneumonia, unspecified organism: Secondary | ICD-10-CM

## 2021-09-03 MED ORDER — PREDNISONE 10 MG PO TABS: 40.0000 mg | ORAL_TABLET | Freq: Every day | ORAL | 0 refills | Status: DC

## 2021-09-03 MED ORDER — PREDNISONE 10 MG PO TABS: ORAL_TABLET | ORAL | 0 refills | Status: DC

## 2021-09-03 MED ORDER — AMOXICILLIN 875 MG PO TABS: 875.0000 mg | ORAL_TABLET | Freq: Two times a day (BID) | ORAL | 0 refills | Status: AC

## 2021-09-03 NOTE — Telephone Encounter (Signed)
Copied from [place] [id number]. Topic: Appointment Scheduling - Scheduling Inquiry for Clinic >> Sep 03, 2021  9:52 AM Tessa Lerner A wrote: Reason for CRM: Patient has been scheduled for a visit at 10:40 09/03/21  The patient is uncertain if that appointment is an office visit or virtual   The patient shares that it was previously confirmed for in person and would like to clarity   Please contact further

## 2021-09-03 NOTE — Progress Notes (Signed)
Established patient visit   [name]   DOB: [birth year]   75 y.o. Male  MRN: [id number] Visit Date: 09/03/2021  Today's healthcare provider: Lelon Huh, MD   Chief Complaint  Patient presents with   Pneumonia    Subjective    Pneumonia He complains of cough, shortness of breath and wheezing. This is a new problem. He was seen at Urgent Care for this on 08/29/2021. X-ray at that time revealed Mild basilar atelectasis.  No infiltrate or edema. Symptoms started about two weeks ago. The problem has been gradually improving. Associated symptoms include dyspnea on exertion and malaise/fatigue. Pertinent negatives include no appetite change, ear pain, fever, headaches, myalgias, nasal congestion, postnasal drip, rhinorrhea, sneezing, sore throat or trouble swallowing. His symptoms are alleviated by prescription cough suppressant and oral steroids (Amoxicillin, Azithromycin and prednisone). He reports significant improvement on treatment but is not back to baseline. His past medical history is significant for pneumonia.      Medications: Outpatient Medications Prior to Visit  Medication Sig   ALPRAZolam (XANAX) 0.5 MG tablet 1/2 TO 1 TABLET BY MOUTH 3 TIMES DAILY AS NEEDED (Patient taking differently: Take 0.25-0.5 mg by mouth 3 (three) times daily as needed for anxiety.)   amLODipine (NORVASC) 10 MG tablet TAKE 1 TABLET BY MOUTH DAILY (Patient taking differently: Take 10 mg by mouth daily.)   amoxicillin (AMOXIL) 875 MG tablet Take 1 tablet (875 mg total) by mouth 2 (two) times daily for 7 days.   augmented betamethasone dipropionate (DIPROLENE-AF) 0.05 % cream Apply 1 application topically 2 (two) times daily as needed (rash).   Bacillus Coagulans-Inulin (PROBIOTIC) 1-250 BILLION-MG CAPS Take 1 capsule by mouth daily at 6 (six) AM.   benzonatate (TESSALON) 100 MG capsule Take 1 capsule (100 mg total) by mouth 3 (three) times daily as needed for cough.   diclofenac  sodium (VOLTAREN) 1 % GEL Apply 4 g topically 4 (four) times daily as needed. (Patient taking differently: Apply 4 g topically 4 (four) times daily as needed (pain).)   fluticasone (FLONASE) 50 MCG/ACT nasal spray Place 2 sprays into both nostrils daily.   hydrocortisone (ANUSOL-HC) 2.5 % rectal cream APPLY TO AFFECTED AREAS 2 TO 3 TIMES DAILY AS NEEDED   ibuprofen (ADVIL,MOTRIN) 200 MG tablet Take 200 mg by mouth every 6 (six) hours as needed for fever, headache or mild pain.   levothyroxine (SYNTHROID) 137 MCG tablet TAKE ONE TABLET EACH MORNING BEFORE BREAKFAST   Multiple Vitamins-Iron (MULTIVITAMIN/IRON PO) Take 1 tablet by mouth daily at 6 (six) AM.   omeprazole (PRILOSEC) 20 MG capsule TAKE 1 CAPSULE BY MOUTH TWICE DAILY BEFORE A MEAL   tamsulosin (FLOMAX) 0.4 MG CAPS capsule TAKE 1 CAPSULE EVERY DAY   azithromycin (ZITHROMAX) 250 MG tablet Take 1 tablet (250 mg total) by mouth daily. Take first 2 tablets together, then 1 every day until finished. (Patient not taking: Reported on 09/03/2021)   predniSONE (DELTASONE) 10 MG tablet Take 4 tablets (40 mg total) by mouth daily for 5 days.   No facility-administered medications prior to visit.    Review of Systems  Constitutional:  Positive for fatigue. Negative for activity change, appetite change, chills, diaphoresis, fever and unexpected weight change.  HENT:  Positive for congestion. Negative for ear discharge, ear pain, postnasal drip, rhinorrhea, sinus pressure, sinus pain, sneezing, sore throat and trouble swallowing.   Eyes: Negative.   Respiratory:  Positive for cough, shortness of breath and wheezing. Negative for  apnea, choking, chest tightness and stridor.   Gastrointestinal: Negative.   Musculoskeletal:  Negative for myalgias.  Neurological:  Negative for dizziness, light-headedness and headaches.      Objective    BP (!) 191/88 (BP Location: Right Arm, Patient Position: Sitting, Cuff Size: Large)   Pulse 80   Temp 98 F (36.7  C) (Oral)   Wt 193 lb (87.5 kg)   SpO2 98%   BMI 33.13 kg/m    Physical Exam   General: Appearance:    Mildly obese male in no acute distress  Eyes:    PERRL, conjunctiva/corneas clear, EOM's intact       Lungs:     Diffuse expiratory wheezes, no rales or rhonchi, respirations unlabored  Heart:    Normal heart rate. Normal rhythm. No murmurs, rubs, or gallops.    MS:   All extremities are intact.    Neurologic:   Awake, alert, oriented x 3. No apparent focal neurological defect.         Assessment & Plan     1. Pneumonia due to infectious organism, unspecified laterality, unspecified part of lung Much improved, but not resolved. Has completely 5 days azithromycin. Extend amoxicillin and prednisone with taper for an additional month.   - amoxicillin (AMOXIL) 875 MG tablet; Take 1 tablet (875 mg total) by mouth 2 (two) times daily for 7 days.  Dispense: 14 tablet; Refill: 0 - predniSONE (DELTASONE) 10 MG tablet; Take 2 tablets daily for 3 days, then 1 tablet daily 4 days  Dispense: 10 tablet; Refill: 0   2. Essential (primary) hypertension Well controlled.  Continue current medications.   - Comprehensive metabolic panel  3. Adult hypothyroidism  - TSH - T4, free  4. Iron deficiency anemia, unspecified iron deficiency anemia type  - CBC  5. Hyperlipidemia, unspecified hyperlipidemia type  - Lipid panel  6. Hypocalcemia  - VITAMIN D 25 Hydroxy (Vit-D Deficiency, Fractures) - Parathyroid hormone, intact (no Ca)      The entirety of the information documented in the History of Present Illness, Review of Systems and Physical Exam were personally obtained by me. Portions of this information were initially documented by the CMA and reviewed by me for thoroughness and accuracy.     Lelon Huh, MD  [facility] [phone] (phone) [phone] (fax)  [place]

## 2021-09-04 LAB — CBC
Hematocrit: 42.8 % (ref 37.5–51.0)
Hemoglobin: 13.9 g/dL (ref 13.0–17.7)
MCH: 28.9 pg (ref 26.6–33.0)
MCHC: 32.5 g/dL (ref 31.5–35.7)
MCV: 89 fL (ref 79–97)
Platelets: 246 10*3/uL (ref 150–450)
RBC: 4.81 x10E6/uL (ref 4.14–5.80)
RDW: 14.8 % (ref 11.6–15.4)
WBC: 9.3 10*3/uL (ref 3.4–10.8)

## 2021-09-04 LAB — T4, FREE: Free T4: 1.86 ng/dL — ABNORMAL HIGH (ref 0.82–1.77)

## 2021-09-04 LAB — LIPID PANEL
Chol/HDL Ratio: 3.6 ratio (ref 0.0–5.0)
Cholesterol, Total: 160 mg/dL (ref 100–199)
HDL: 44 mg/dL (ref >39)
LDL Chol Calc (NIH): 90 mg/dL (ref 0–99)
Triglycerides: 147 mg/dL (ref 0–149)
VLDL Cholesterol Cal: 26 mg/dL (ref 5–40)

## 2021-09-04 LAB — COMPREHENSIVE METABOLIC PANEL
ALT: 31 IU/L (ref 0–44)
AST: 22 IU/L (ref 0–40)
Albumin/Globulin Ratio: 1.2 (ref 1.2–2.2)
Albumin: 4.2 g/dL (ref 3.7–4.7)
Alkaline Phosphatase: 72 IU/L (ref 44–121)
BUN/Creatinine Ratio: 17 (ref 10–24)
BUN: 16 mg/dL (ref 8–27)
Bilirubin Total: 0.8 mg/dL (ref 0.0–1.2)
CO2: 25 mmol/L (ref 20–29)
Calcium: 9.1 mg/dL (ref 8.6–10.2)
Chloride: 101 mmol/L (ref 96–106)
Creatinine, Ser: 0.95 mg/dL (ref 0.76–1.27)
Globulin, Total: 3.4 g/dL (ref 1.5–4.5)
Glucose: 92 mg/dL (ref 70–99)
Potassium: 3.3 mmol/L — ABNORMAL LOW (ref 3.5–5.2)
Sodium: 140 mmol/L (ref 134–144)
Total Protein: 7.6 g/dL (ref 6.0–8.5)
eGFR: 83 mL/min/{1.73_m2} (ref >59)

## 2021-09-04 LAB — PARATHYROID HORMONE, INTACT (NO CA): PTH: 32 pg/mL (ref 15–65)

## 2021-09-04 LAB — TSH: TSH: 0.584 u[IU]/mL (ref 0.450–4.500)

## 2021-09-04 LAB — VITAMIN D 25 HYDROXY (VIT D DEFICIENCY, FRACTURES): Vit D, 25-Hydroxy: 29.7 ng/mL — ABNORMAL LOW (ref 30.0–100.0)

## 2021-09-05 ENCOUNTER — Telehealth: Payer: Self-pay

## 2021-09-05 MED ORDER — FLUCONAZOLE 100 MG PO TABS: ORAL_TABLET | ORAL | 0 refills | Status: AC

## 2021-09-05 NOTE — Telephone Encounter (Signed)
[facility]

## 2021-09-05 NOTE — Telephone Encounter (Signed)
Copied from [place] [phone]. Topic: General - Other >> Sep 05, 2021  1:03 PM Tessa Lerner A wrote: Reason for CRM: The patient's wife has called to share that they believe the patient has developed thrush as a result of taking a recent round of antibiotics   The patient's wife would like the patient to be prescribed something to help with discomfort   Please contact further when possible

## 2021-09-05 NOTE — Telephone Encounter (Signed)
Ms. Sobolewski advised.   Thanks,   -Mickel Baas

## 2021-10-16 ENCOUNTER — Other Ambulatory Visit: Payer: Self-pay | Admitting: Family Medicine

## 2021-10-16 DIAGNOSIS — Z85820 Personal history of malignant melanoma of skin: Secondary | ICD-10-CM | POA: Diagnosis not present

## 2021-10-16 DIAGNOSIS — D044 Carcinoma in situ of skin of scalp and neck: Secondary | ICD-10-CM | POA: Diagnosis not present

## 2021-10-16 DIAGNOSIS — F419 Anxiety disorder, unspecified: Secondary | ICD-10-CM

## 2021-10-16 DIAGNOSIS — D2272 Melanocytic nevi of left lower limb, including hip: Secondary | ICD-10-CM | POA: Diagnosis not present

## 2021-10-16 DIAGNOSIS — L570 Actinic keratosis: Secondary | ICD-10-CM | POA: Diagnosis not present

## 2021-10-16 DIAGNOSIS — D485 Neoplasm of uncertain behavior of skin: Secondary | ICD-10-CM | POA: Diagnosis not present

## 2021-10-16 DIAGNOSIS — D2262 Melanocytic nevi of left upper limb, including shoulder: Secondary | ICD-10-CM | POA: Diagnosis not present

## 2021-10-16 DIAGNOSIS — Z85828 Personal history of other malignant neoplasm of skin: Secondary | ICD-10-CM | POA: Diagnosis not present

## 2021-10-16 DIAGNOSIS — L400 Psoriasis vulgaris: Secondary | ICD-10-CM | POA: Diagnosis not present

## 2021-10-16 NOTE — Telephone Encounter (Signed)
Requested medications are due for refill today.  yes  Requested medications are on the active medications list.  yes  Last refill. 03/21/2020  Future visit scheduled.   no  Notes to clinic.  Medication not delegated.    Requested Prescriptions  Pending Prescriptions Disp Refills   ALPRAZolam (XANAX) 0.5 MG tablet [Pharmacy Med Name: ALPRAZOLAM 0.5 MG TAB] 60 tablet     Sig: TAKE 1/2 TO 1 TABLET 3 TIMES DAILY AS NEEDED     Not Delegated - Psychiatry:  Anxiolytics/Hypnotics Failed - 10/16/2021  2:45 AM      Failed - This refill cannot be delegated      Failed - Urine Drug Screen completed in last 360 days      Passed - Valid encounter within last 6 months    Recent Outpatient Visits           1 month ago Pneumonia due to infectious organism, unspecified laterality, unspecified part of lung   [facility] Birdie Sons, MD   10 months ago SBO (small bowel obstruction) [facility])   [facility] Birdie Sons, MD   1 year ago Abdominal pain, acute, epigastric   [place], Vickki Muff, PA-C   1 year ago Essential (primary) hypertension   [facility], Kirstie Peri, MD   1 year ago Essential (primary) hypertension   [facility], Kirstie Peri, MD

## 2021-11-21 ENCOUNTER — Ambulatory Visit (INDEPENDENT_AMBULATORY_CARE_PROVIDER_SITE_OTHER): Payer: Medicare Other

## 2021-11-21 ENCOUNTER — Other Ambulatory Visit: Payer: Self-pay | Admitting: Family Medicine

## 2021-11-21 DIAGNOSIS — Z0000 Encounter for general adult medical examination without abnormal findings: Secondary | ICD-10-CM

## 2021-11-21 DIAGNOSIS — I10 Essential (primary) hypertension: Secondary | ICD-10-CM

## 2021-11-21 NOTE — Telephone Encounter (Signed)
Requested Prescriptions  Pending Prescriptions Disp Refills   levothyroxine (SYNTHROID) 137 MCG tablet [Pharmacy Med Name: LEVOTHYROXINE SODIUM 137 MCG TAB] 90 tablet 0    Sig: TAKE ONE TABLET EACH MORNING BEFORE BREAKFAST     Endocrinology:  Hypothyroid Agents Failed - 11/21/2021 11:26 AM      Failed - TSH needs to be rechecked within 3 months after an abnormal result. Refill until TSH is due.      Passed - TSH in normal range and within 360 days    TSH  Date Value Ref Range Status  09/03/2021 0.584 0.450 - 4.500 uIU/mL Final         Passed - Valid encounter within last 12 months    Recent Outpatient Visits          2 months ago Pneumonia due to infectious organism, unspecified laterality, unspecified part of lung   [facility] Birdie Sons, MD   11 months ago SBO (small bowel obstruction) [facility])   [facility] Birdie Sons, MD   1 year ago Abdominal pain, acute, epigastric   [place], Vickki Muff, PA-C   1 year ago Essential (primary) hypertension   [facility], Kirstie Peri, MD   1 year ago Essential (primary) hypertension   [facility], Kirstie Peri, MD      Future Appointments            In 4 months [facility], Kirstie Peri, MD [facility]            amLODipine (NORVASC) 10 MG tablet [Pharmacy Med Name: AMLODIPINE BESYLATE 10 MG TAB] 90 tablet 4    Sig: TAKE 1 TABLET BY MOUTH DAILY     Cardiovascular:  Calcium Channel Blockers Failed - 11/21/2021 11:26 AM      Failed - Last BP in normal range    BP Readings from Last 1 Encounters:  09/03/21 (!) 178/98         Passed - Valid encounter within last 6 months    Recent Outpatient Visits          2 months ago Pneumonia due to infectious organism, unspecified laterality, unspecified part of lung   [facility] Birdie Sons, MD   11 months ago SBO (small bowel obstruction) [facility])   [facility] Birdie Sons, MD   1 year ago Abdominal pain, acute, epigastric   [facility], Vickki Muff, PA-C   1 year ago Essential (primary) hypertension   [facility], Kirstie Peri, MD   1 year ago Essential (primary) hypertension   [facility], Kirstie Peri, MD      Future Appointments            In 4 months [facility], Kirstie Peri, MD [facility] [place]

## 2021-11-21 NOTE — Progress Notes (Signed)
Virtual Visit via Telephone Note  I connected with  Christian Mora on 11/21/21 at 11:00 AM EST by telephone and verified that I am speaking with the correct person using two identifiers.  Location: Patient: home Provider: [facility] Persons participating in the virtual visit: [place]   I discussed the limitations, risks, security and privacy concerns of performing an evaluation and management service by telephone and the availability of in person appointments. The patient expressed understanding and agreed to proceed.  Interactive audio and video telecommunications were attempted between this nurse and patient, however failed, due to patient having technical difficulties OR patient did not have access to video capability.  We continued and completed visit with audio only.  Some vital signs may be absent or patient reported.   Dionisio David, LPN  Subjective:   Christian Mora is a 76 y.o. male who presents for Medicare Annual/Subsequent preventive examination.  Review of Systems           Objective:    There were no vitals filed for this visit. There is no height or weight on file to calculate BMI.  Advanced Directives 11/14/2020 10/17/2020 10/16/2020 11/07/2019 11/05/2018 03/03/2018 10/16/2015  Does Patient Have a Medical Advance Directive? Yes Yes No Yes Yes Yes Yes  Type of Paramedic of [place];Living will - - Living will;Healthcare Power of [place];Living will [place];Living will Living will  Copy of [place] in Chart? Yes - validated most recent copy scanned in chart (See row information) - - Yes - validated most recent copy scanned in chart (See row information) Yes - validated most recent copy scanned in chart (See row information) No - copy requested -  Would patient like information on creating a medical advance directive? - - No - Patient declined - - - -    Current  Medications (verified) Outpatient Encounter Medications as of 11/21/2021  Medication Sig   ALPRAZolam (XANAX) 0.5 MG tablet Take 0.5-1 tablets (0.25-0.5 mg total) by mouth 3 (three) times daily as needed for anxiety.   amLODipine (NORVASC) 10 MG tablet TAKE 1 TABLET BY MOUTH DAILY (Patient taking differently: Take 10 mg by mouth daily.)   augmented betamethasone dipropionate (DIPROLENE-AF) 0.05 % cream Apply 1 application topically 2 (two) times daily as needed (rash).   azithromycin (ZITHROMAX) 250 MG tablet Take 1 tablet (250 mg total) by mouth daily. Take first 2 tablets together, then 1 every day until finished. (Patient not taking: Reported on 09/03/2021)   Bacillus Coagulans-Inulin (PROBIOTIC) 1-250 BILLION-MG CAPS Take 1 capsule by mouth daily at 6 (six) AM.   benzonatate (TESSALON) 100 MG capsule Take 1 capsule (100 mg total) by mouth 3 (three) times daily as needed for cough.   diclofenac sodium (VOLTAREN) 1 % GEL Apply 4 g topically 4 (four) times daily as needed. (Patient taking differently: Apply 4 g topically 4 (four) times daily as needed (pain).)   fluticasone (FLONASE) 50 MCG/ACT nasal spray Place 2 sprays into both nostrils daily.   hydrocortisone (ANUSOL-HC) 2.5 % rectal cream APPLY TO AFFECTED AREAS 2 TO 3 TIMES DAILY AS NEEDED   ibuprofen (ADVIL,MOTRIN) 200 MG tablet Take 200 mg by mouth every 6 (six) hours as needed for fever, headache or mild pain.   levothyroxine (SYNTHROID) 137 MCG tablet TAKE ONE TABLET EACH MORNING BEFORE BREAKFAST   Multiple Vitamins-Iron (MULTIVITAMIN/IRON PO) Take 1 tablet by mouth daily at 6 (six) AM.   omeprazole (  PRILOSEC) 20 MG capsule TAKE 1 CAPSULE BY MOUTH TWICE DAILY BEFORE A MEAL   predniSONE (DELTASONE) 10 MG tablet Take 2 tablets daily for 3 days, then 1 tablet daily 4 days   tamsulosin (FLOMAX) 0.4 MG CAPS capsule TAKE 1 CAPSULE EVERY DAY   No facility-administered encounter medications on file as of 11/21/2021.    Allergies  (verified) Patient has no known allergies.   History: Past Medical History:  Diagnosis Date   Allergy    Anemia    Anxiety    Diverticulitis    GERD (gastroesophageal reflux disease)    Hyperlipidemia    Hypertension    Hypothyroidism    Melanoma of skin ([place]) 03/13/2003   Thyroid disease    Past Surgical History:  Procedure Laterality Date   COLONOSCOPY WITH PROPOFOL N/A 03/03/2018   Procedure: COLONOSCOPY WITH PROPOFOL;  Surgeon: Jonathon Bellows, MD;  Location[facility] END[facility]COPY;  Service: Gastroenterology;  Laterality: N/A;   ESOPHAGOGASTRODUODENOSCOPY (EGD) WITH PROPOFOL N/A 03/03/2018   Procedure: ESOPHAGOGASTRODUODENOSCOPY (EGD) WITH PROPOFOL;  Surgeon: Jonathon Bellows, MD;  Location[facility] ENDOSCOPY;  Service: Gastroenterology;  Laterality: N/A;   GIVENS CAPSULE STUDY N/A 03/12/2018   Procedure: GIVENS CAPSULE STUDY;  Surgeon: Jonathon Bellows, MD;  Location: [facility] ENDOSCOPY;  Service: Gastroenterology;  Laterality: N/A;   lymph node removed  2004   TONSILLECTOMY AND ADENOIDECTOMY  1954   Family History  Problem Relation Age of Onset   Hypertension Mother    Cancer Father        prostate   Diabetes Son    Social History   Socioeconomic History   Marital status: Married    Spouse name: Not on file   Number of children: 2   Years of education: Not on file   Highest education level: Bachelor's degree (e.g., BA, AB, BS)  Occupational History   Occupation: retired  Tobacco Use   Smoking status: Never   Smokeless tobacco: Never  Vaping Use   Vaping Use: Never used  Substance and Sexual Activity   Alcohol use: No   Drug use: No   Sexual activity: Yes  Other Topics Concern   Not on file  Social History Narrative   Not on file   Social Determinants of Health   Financial Resource Strain: Not on file  Food Insecurity: Not on file  Transportation Needs: Not on file  Physical Activity: Not on file  Stress: Not on file  Social Connections: Not on file    Tobacco  Counseling Counseling given: Not Answered   Clinical Intake:  Pre-visit preparation completed: Yes  Pain : No/denies pain     Nutritional Risks: None Diabetes: No  How often do you need to have someone help you when you read instructions, pamphlets, or other written materials from your doctor or pharmacy?: 1 - Never  Diabetic?no  Interpreter Needed?: No  Information entered by :: Kirke Shaggy, LPN   Activities of Daily Living In your present state of health, do you have any difficulty performing the following activities: 11/20/2021 11/18/2021  Hearing? Tempie Donning  Vision? N N  Difficulty concentrating or making decisions? N N  Walking or climbing stairs? N N  Dressing or bathing? N N  Doing errands, shopping? N N  Preparing Food and eating ? N N  Using the Toilet? N N  In the past six months, have you accidently leaked urine? N N  Do you have problems with loss of bowel control? N N  Managing your Medications? N N  Managing your Finances? N N  Housekeeping or managing your Housekeeping? N N  Some recent data might be hidden    Patient Care Team: Birdie Sons, MD as PCP - General (Family Medicine) Dasher, Rayvon Char, MD (Dermatology) Birder Robson, MD as Referring Physician (Ophthalmology) Kipp Laurence, MD as Referring Physician (Audiology) Jules Husbands, MD as Consulting Physician (General Surgery)  Indicate any recent Medical Services you may have received from other than [facility] providers in the past year (date may be approximate).     Assessment:   This is a routine wellness examination for [place].  Hearing/Vision screen No results found.  Dietary issues and exercise activities discussed:     Goals Addressed   None    Depression Screen PHQ 2/9 Scores 11/14/2020 11/07/2019 11/07/2019 11/05/2018 11/05/2017 07/30/2015  PHQ - 2 Score 0 0 0 0 0 0    Fall Risk Fall Risk  11/20/2021 11/18/2021 11/18/2021 01/16/2021 11/14/2020  Falls in the past year? 0 0 0 0 0   Number falls in past yr: 0 0 0 - 0  Injury with Fall? 0 0 0 - 0    FALL RISK PREVENTION PERTAINING TO THE HOME:  Any stairs in or around the home? No  If so, are there any without handrails? No  Home free of loose throw rugs in walkways, pet beds, electrical cords, etc? Yes  Adequate lighting in your home to reduce risk of falls? Yes   ASSISTIVE DEVICES UTILIZED TO PREVENT FALLS:  Life alert? No  Use of a cane, walker or w/c? No  Grab bars in the bathroom? No  Shower chair or bench in shower? No  Elevated toilet seat or a handicapped toilet? Yes    Cognitive Function:Normal cognitive status assessed by direct observation by this Nurse Health Advisor. No abnormalities found.       6CIT Screen 11/05/2018  What Year? 0 points  What month? 0 points  What time? 0 points  Count back from 20 0 points  Months in reverse 0 points  Repeat phrase 0 points  Total Score 0    Immunizations Immunization History  Administered Date(s) Administered   Influenza Split 10/06/2007, 08/10/2009, 08/03/2010, 08/13/2011, 08/13/2012   Influenza, High Dose Seasonal PF 09/14/2014, 07/30/2015, 08/12/2016, 07/23/2018, 07/15/2019, 07/30/2020   Influenza,inj,Quad PF,6+ Mos 07/20/2013   Influenza,inj,Quad PF,6-35 Mos 07/25/2021   Influenza-Unspecified 08/10/2017   Moderna Sars-Covid-2 Vaccination 12/16/2019, 01/16/2020, 08/27/2020   Pneumococcal Conjugate-13 09/14/2014   Pneumococcal Polysaccharide-23 08/13/2012   Tdap 11/10/2017   Zoster Recombinat (Shingrix) 01/27/2018, 04/22/2018   Zoster, Live 08/29/2013    TDAP status: Up to date  Flu Vaccine status: Up to date  Pneumococcal vaccine status: Up to date  Covid-19 vaccine status: Completed vaccines  Qualifies for Shingles Vaccine? Yes   Zostavax completed Yes   Shingrix Completed?: Yes  Screening Tests Health Maintenance  Topic Date Due   COVID-19 Vaccine (4 - Booster for Moderna series) 10/22/2020   COLONOSCOPY (Pts 45-53yrs  Insurance coverage will need to be confirmed)  03/03/2021   TETANUS/TDAP  11/11/2027   Pneumonia Vaccine 61+ Years old  Completed   INFLUENZA VACCINE  Completed   Hepatitis C Screening  Completed   Zoster Vaccines- Shingrix  Completed   HPV VACCINES  Aged Out   Fecal DNA (Cologuard)  Discontinued    Health Maintenance  Health Maintenance Due  Topic Date Due   COVID-19 Vaccine (4 - Booster for Moderna series) 10/22/2020   COLONOSCOPY (Pts 45-33yrs Insurance coverage  will need to be confirmed)  03/03/2021    Colorectal cancer screening: Type of screening: Colonoscopy. Completed 03/03/18. Repeat every 5 years  Lung Cancer Screening: (Low Dose CT Chest recommended if Age 62-80 years, 30 pack-year currently smoking OR have quit w/in 15years.) does not qualify.    Additional Screening:  Hepatitis C Screening: does qualify; Completed 12/10/16  Vision Screening: Recommended annual ophthalmology exams for early detection of glaucoma and other disorders of the eye. Is the patient up to date with their annual eye exam?  Yes  Who is the provider or what is the name of the office in which the patient attends annual eye exams? [facility] If pt is not established with a provider, would they like to be referred to a provider to establish care? No .   Dental Screening: Recommended annual dental exams for proper oral hygiene  Community Resource Referral / Chronic Care Management: CRR required this visit?  No   CCM required this visit?  No      Plan:     I have personally reviewed and noted the following in the patients chart:   Medical and social history Use of alcohol, tobacco or illicit drugs  Current medications and supplements including opioid prescriptions. Patient is not currently taking opioid prescriptions. Functional ability and status Nutritional status Physical activity Advanced directives List of other physicians Hospitalizations, surgeries, and ER visits in  previous 12 months Vitals Screenings to include cognitive, depression, and falls Referrals and appointments  In addition, I have reviewed and discussed with patient certain preventive protocols, quality metrics, and best practice recommendations. A written personalized care plan for preventive services as well as general preventive health recommendations were provided to patient.     Dionisio David, LPN   [id number]/[id number]   Nurse Notes: none

## 2021-11-21 NOTE — Patient Instructions (Signed)
[name] , Thank you for taking time to come for your Medicare Wellness Visit. I appreciate your ongoing commitment to your health goals. Please review the following plan we discussed and let me know if I can assist you in the future.   Screening recommendations/referrals: Colonoscopy: 03/03/18 Recommended yearly ophthalmology/optometry visit for glaucoma screening and checkup Recommended yearly dental visit for hygiene and checkup  Vaccinations: Influenza vaccine: 07/25/21 Pneumococcal vaccine: 09/14/14 Tdap vaccine: 11/10/17 Shingles vaccine: Zostavax 08/29/13  Shingrix 01/27/18, 04/22/18   Covid-19: 12/16/19, 01/16/20, 08/27/20  Advanced directives: no  Conditions/risks identified: none  Next appointment: Follow up in one year for your annual wellness visit.   Preventive Care 6 Years and Older, Male Preventive care refers to lifestyle choices and visits with your health care provider that can promote health and wellness. What does preventive care include? A yearly physical exam. This is also called an annual well check. Dental exams once or twice a year. Routine eye exams. Ask your health care provider how often you should have your eyes checked. Personal lifestyle choices, including: Daily care of your teeth and gums. Regular physical activity. Eating a healthy diet. Avoiding tobacco and drug use. Limiting alcohol use. Practicing safe sex. Taking low doses of aspirin every day. Taking vitamin and mineral supplements as recommended by your health care provider. What happens during an annual well check? The services and screenings done by your health care provider during your annual well check will depend on your age, overall health, lifestyle risk factors, and family history of disease. Counseling  Your health care provider may ask you questions about your: Alcohol use. Tobacco use. Drug use. Emotional well-being. Home and relationship well-being. Sexual activity. Eating  habits. History of falls. Memory and ability to understand (cognition). Work and work Statistician. Screening  You may have the following tests or measurements: Height, weight, and BMI. Blood pressure. Lipid and cholesterol levels. These may be checked every 5 years, or more frequently if you are over 60 years old. Skin check. Lung cancer screening. You may have this screening every year starting at age 41 if you have a 30-pack-year history of smoking and currently smoke or have quit within the past 15 years. Fecal occult blood test (FOBT) of the stool. You may have this test every year starting at age 63. Flexible sigmoidoscopy or colonoscopy. You may have a sigmoidoscopy every 5 years or a colonoscopy every 10 years starting at age 55. Prostate cancer screening. Recommendations will vary depending on your family history and other risks. Hepatitis C blood test. Hepatitis B blood test. Sexually transmitted disease (STD) testing. Diabetes screening. This is done by checking your blood sugar (glucose) after you have not eaten for a while (fasting). You may have this done every 1-3 years. Abdominal aortic aneurysm (AAA) screening. You may need this if you are a current or former smoker. Osteoporosis. You may be screened starting at age 75 if you are at high risk. Talk with your health care provider about your test results, treatment options, and if necessary, the need for more tests. Vaccines  Your health care provider may recommend certain vaccines, such as: Influenza vaccine. This is recommended every year. Tetanus, diphtheria, and acellular pertussis (Tdap, Td) vaccine. You may need a Td booster every 10 years. Zoster vaccine. You may need this after age 25. Pneumococcal 13-valent conjugate (PCV13) vaccine. One dose is recommended after age 45. Pneumococcal polysaccharide (PPSV23) vaccine. One dose is recommended after age 72. Talk to your health care  provider about which screenings and  vaccines you need and how often you need them. This information is not intended to replace advice given to you by your health care provider. Make sure you discuss any questions you have with your health care provider. Document Released: 11/16/2015 Document Revised: 07/09/2016 Document Reviewed: 08/21/2015 Elsevier Interactive Patient Education  2017 [employer] Prevention in the Home Falls can cause injuries. They can happen to people of all ages. There are many things you can do to make your home safe and to help prevent falls. What can I do on the outside of my home? Regularly fix the edges of walkways and driveways and fix any cracks. Remove anything that might make you trip as you walk through a door, such as a raised step or threshold. Trim any bushes or trees on the path to your home. Use bright outdoor lighting. Clear any walking paths of anything that might make someone trip, such as rocks or tools. Regularly check to see if handrails are loose or broken. Make sure that both sides of any steps have handrails. Any raised decks and porches should have guardrails on the edges. Have any leaves, snow, or ice cleared regularly. Use sand or salt on walking paths during winter. Clean up any spills in your garage right away. This includes oil or grease spills. What can I do in the bathroom? Use night lights. Install grab bars by the toilet and in the tub and shower. Do not use towel bars as grab bars. Use non-skid mats or decals in the tub or shower. If you need to sit down in the shower, use a plastic, non-slip stool. Keep the floor dry. Clean up any water that spills on the floor as soon as it happens. Remove soap buildup in the tub or shower regularly. Attach bath mats securely with double-sided non-slip rug tape. Do not have throw rugs and other things on the floor that can make you trip. What can I do in the bedroom? Use night lights. Make sure that you have a light by your  bed that is easy to reach. Do not use any sheets or blankets that are too big for your bed. They should not hang down onto the floor. Have a firm chair that has side arms. You can use this for support while you get dressed. Do not have throw rugs and other things on the floor that can make you trip. What can I do in the kitchen? Clean up any spills right away. Avoid walking on wet floors. Keep items that you use a lot in easy-to-reach places. If you need to reach something above you, use a strong step stool that has a grab bar. Keep electrical cords out of the way. Do not use floor polish or wax that makes floors slippery. If you must use wax, use non-skid floor wax. Do not have throw rugs and other things on the floor that can make you trip. What can I do with my stairs? Do not leave any items on the stairs. Make sure that there are handrails on both sides of the stairs and use them. Fix handrails that are broken or loose. Make sure that handrails are as long as the stairways. Check any carpeting to make sure that it is firmly attached to the stairs. Fix any carpet that is loose or worn. Avoid having throw rugs at the top or bottom of the stairs. If you do have throw rugs, attach them to the  floor with carpet tape. Make sure that you have a light switch at the top of the stairs and the bottom of the stairs. If you do not have them, ask someone to add them for you. What else can I do to help prevent falls? Wear shoes that: Do not have high heels. Have rubber bottoms. Are comfortable and fit you well. Are closed at the toe. Do not wear sandals. If you use a stepladder: Make sure that it is fully opened. Do not climb a closed stepladder. Make sure that both sides of the stepladder are locked into place. Ask someone to hold it for you, if possible. Clearly mark and make sure that you can see: Any grab bars or handrails. First and last steps. Where the edge of each step is. Use tools that  help you move around (mobility aids) if they are needed. These include: Canes. Walkers. Scooters. Crutches. Turn on the lights when you go into a dark area. Replace any light bulbs as soon as they burn out. Set up your furniture so you have a clear path. Avoid moving your furniture around. If any of your floors are uneven, fix them. If there are any pets around you, be aware of where they are. Review your medicines with your doctor. Some medicines can make you feel dizzy. This can increase your chance of falling. Ask your doctor what other things that you can do to help prevent falls. This information is not intended to replace advice given to you by your health care provider. Make sure you discuss any questions you have with your health care provider. Document Released: 08/16/2009 Document Revised: 03/27/2016 Document Reviewed: 11/24/2014 Elsevier Interactive Patient Education  2017 [employer].

## 2021-11-25 ENCOUNTER — Encounter: Payer: Self-pay | Admitting: Family Medicine

## 2021-11-25 MED ORDER — PANTOPRAZOLE SODIUM 40 MG PO TBEC: 40.0000 mg | DELAYED_RELEASE_TABLET | Freq: Every day | ORAL | 6 refills | Status: DC

## 2021-12-04 DIAGNOSIS — M1711 Unilateral primary osteoarthritis, right knee: Secondary | ICD-10-CM | POA: Diagnosis not present

## 2021-12-17 DIAGNOSIS — D044 Carcinoma in situ of skin of scalp and neck: Secondary | ICD-10-CM | POA: Diagnosis not present

## 2022-02-24 DIAGNOSIS — M1711 Unilateral primary osteoarthritis, right knee: Secondary | ICD-10-CM | POA: Diagnosis not present

## 2022-03-06 DIAGNOSIS — H43813 Vitreous degeneration, bilateral: Secondary | ICD-10-CM | POA: Diagnosis not present

## 2022-03-24 ENCOUNTER — Ambulatory Visit: Payer: Federal, State, Local not specified - PPO | Admitting: Family Medicine

## 2022-03-27 NOTE — Progress Notes (Unsigned)
I,Roshena L Chambers,acting as a scribe for Lelon Huh, MD.,have documented all relevant documentation on the behalf of Lelon Huh, MD,as directed by  Lelon Huh, MD while in the presence of Lelon Huh, MD.   Established patient visit   [name] [facility]   DOB: August 01, 1946   76 y.o. Male  MRN: [id number] Visit Date: 03/28/2022  Today's healthcare provider: Lelon Huh, MD   Chief Complaint  Patient presents with   Hypertension   Hypothyroidism   Subjective    HPI  Hypertension, follow-up  BP Readings from Last 3 Encounters:  03/28/22 (!) 158/87  09/03/21 (!) 178/98  08/29/21 (!) 178/98   Wt Readings from Last 3 Encounters:  03/28/22 193 lb (87.5 kg)  09/03/21 193 lb (87.5 kg)  08/29/21 194 lb (88 kg)     He was last seen for hypertension 6 months ago.  BP at that visit was 178/98. Management since that visit includes continuing same treatment.  He reports fair compliance with treatment. He is having side effects. Hypotension- patient decreased amlodipine to 1/2 tablet daily due to low blood pressure readings at home a few months ago, which were occasionally under 100.  He is following a Regular diet. He is not exercising. He does not smoke.  Use of agents associated with hypertension: none.   Outside blood pressures are averaging 128-154/ 64-81. Symptoms: No chest pain No chest pressure  No palpitations No syncope  No dyspnea No orthopnea  No paroxysmal nocturnal dyspnea No lower extremity edema   Pertinent labs Lab Results  Component Value Date   CHOL 160 09/03/2021   HDL 44 09/03/2021   LDLCALC 90 09/03/2021   TRIG 147 09/03/2021   CHOLHDL 3.6 09/03/2021   Lab Results  Component Value Date   NA 140 09/03/2021   K 3.3 (L) 09/03/2021   CREATININE 0.95 09/03/2021   EGFR 83 09/03/2021   GLUCOSE 92 09/03/2021   TSH 0.584 09/03/2021     The 10-year ASCVD risk score (Arnett DK, et al., 2019) is:  39.8%  ---------------------------------------------------------------------------------------------------   Hypothyroid, follow-up  Lab Results  Component Value Date   TSH 0.584 09/03/2021   TSH 3.170 11/21/2020   TSH 1.470 03/21/2020   FREET4 1.86 (H) 09/03/2021   FREET4 1.36 11/21/2020    Wt Readings from Last 3 Encounters:  03/28/22 193 lb (87.5 kg)  09/03/21 193 lb (87.5 kg)  08/29/21 194 lb (88 kg)    He was last seen for hypothyroid 6 months ago.  Management since that visit includes continuing same treatment. He reports good compliance with treatment. He is not having side effects.   Symptoms: No change in energy level No constipation  No diarrhea No heat / cold intolerance  No nervousness No palpitations  No weight changes    -----------------------------------------------------------------------------------------   Follow up for vitamin D Deficiency:  The patient was last seen for this 6 months ago. Changes made at last visit include recommend starting on OTC vitamin D3 1,000 units once a day.   He reports good compliance with treatment. Patient is unsure of the dose of Vitamin D supplement he currently takes. He feels that condition is Unchanged. He is not having side effects.   Lab Results  Component Value Date   VD25OH 29.7 (L) 09/03/2021    ---------------------------------------------------------------------------------------------------------------   Medications: Outpatient Medications Prior to Visit  Medication Sig   ALPRAZolam (XANAX) 0.5 MG tablet Take 0.5-1 tablets (0.25-0.5 mg total) by mouth 3 (three) times  daily as needed for anxiety.   amLODipine (NORVASC) 10 MG tablet TAKE 1 TABLET BY MOUTH DAILY (Patient taking differently: Take 5 mg by mouth daily.)   Bacillus Coagulans-Inulin (PROBIOTIC) 1-250 BILLION-MG CAPS Take 1 capsule by mouth daily at 6 (six) AM.   Cholecalciferol (VITAMIN D3 PO) Take 1 tablet by mouth daily.   diclofenac  sodium (VOLTAREN) 1 % GEL Apply 4 g topically 4 (four) times daily as needed. (Patient taking differently: Apply 4 g topically 4 (four) times daily as needed (pain).)   fluticasone (FLONASE) 50 MCG/ACT nasal spray Place 2 sprays into both nostrils daily.   hydrocortisone (ANUSOL-HC) 2.5 % rectal cream APPLY TO AFFECTED AREAS 2 TO 3 TIMES DAILY AS NEEDED   levothyroxine (SYNTHROID) 137 MCG tablet TAKE ONE TABLET EACH MORNING BEFORE BREAKFAST   Multiple Vitamins-Iron (MULTIVITAMIN/IRON PO) Take 1 tablet by mouth daily at 6 (six) AM.   pantoprazole (PROTONIX) 40 MG tablet Take 1 tablet (40 mg total) by mouth daily.   tamsulosin (FLOMAX) 0.4 MG CAPS capsule TAKE 1 CAPSULE EVERY DAY   augmented betamethasone dipropionate (DIPROLENE-AF) 0.05 % cream Apply 1 application topically 2 (two) times daily as needed (rash). (Patient not taking: Reported on 03/28/2022)   benzonatate (TESSALON) 100 MG capsule Take 1 capsule (100 mg total) by mouth 3 (three) times daily as needed for cough. (Patient not taking: Reported on 11/21/2021)   [DISCONTINUED] calcium-vitamin D (OSCAL WITH D) 500-5 MG-MCG tablet Take 1 tablet by mouth. (Patient not taking: Reported on 03/28/2022)   [DISCONTINUED] ibuprofen (ADVIL,MOTRIN) 200 MG tablet Take 200 mg by mouth every 6 (six) hours as needed for fever, headache or mild pain. (Patient not taking: Reported on 03/28/2022)   [DISCONTINUED] VITAMIN D, ERGOCALCIFEROL, PO Take by mouth.   No facility-administered medications prior to visit.    Review of Systems  Constitutional:  Negative for appetite change, chills and fever.  Respiratory:  Negative for chest tightness, shortness of breath and wheezing.   Cardiovascular:  Negative for chest pain and palpitations.  Gastrointestinal:  Negative for abdominal pain, nausea and vomiting.   {Labs  Heme  Chem  Endocrine  Serology  Results Review (optional):23779}   Objective    BP (!) 158/87 (BP Location: Left Arm, Patient Position:  Sitting, Cuff Size: Large)   Pulse 78   Temp 97.9 F (36.6 C) (Oral)   Resp 16   Wt 193 lb (87.5 kg)   SpO2 99% Comment: room air  BMI 33.13 kg/m  {Show previous vital signs (optional):23777}  Physical Exam   General appearance: Obese male, cooperative and in no acute distress Head: Normocephalic, without obvious abnormality, atraumatic Respiratory: Respirations even and unlabored, normal respiratory rate Extremities: All extremities are intact.  Skin: Skin color, texture, turgor normal. No rashes seen  Psych: Appropriate mood and affect. Neurologic: Mental status: Alert, oriented to person, place, and time, thought content appropriate.     Assessment & Plan     1. Essential (primary) hypertension Home BP much lower than office readings. Had been seeing systolic Bps under 470 when he was taking $RemoveBe'10mg'mjHeyEXpg$  amlodipine. Have mostly been in the 120s to 130s since cutting back to $Remov'5mg'mcuSjU$ .   - Renal function panel - amLODipine (NORVASC) 5 MG tablet; Take 1 tablet (5 mg total) by mouth daily.  Dispense: 90 tablet; Refill: 3  2. Hyperlipidemia, unspecified hyperlipidemia type Diet controlled - Lipid panel  3. Vitamin D deficiency Doing well with addition of OTC vitamin D supplement  - VITAMIN D 25  Hydroxy (Vit-D Deficiency, Fractures)  4. Adult hypothyroidism  - T4, free - TSH  5. Gastroesophageal reflux disease without esophagitis Well controlled on current PPI which he needs refilledd - pantoprazole (PROTONIX) 40 MG tablet; Take 1 tablet (40 mg total) by mouth daily.  Dispense: 90 tablet; Refill: 4      The entirety of the information documented in the History of Present Illness, Review of Systems and Physical Exam were personally obtained by me. Portions of this information were initially documented by the CMA and reviewed by me for thoroughness and accuracy.     Lelon Huh, MD  [facility] [phone] (phone) [phone] (fax)  [place]

## 2022-03-28 ENCOUNTER — Ambulatory Visit (INDEPENDENT_AMBULATORY_CARE_PROVIDER_SITE_OTHER): Payer: Medicare Other | Admitting: Family Medicine

## 2022-03-28 ENCOUNTER — Encounter: Payer: Self-pay | Admitting: Family Medicine

## 2022-03-28 VITALS — BP 158/87 | HR 78 | Temp 97.9°F | Resp 16 | Wt 193.0 lb

## 2022-03-28 DIAGNOSIS — I10 Essential (primary) hypertension: Secondary | ICD-10-CM | POA: Diagnosis not present

## 2022-03-28 DIAGNOSIS — K219 Gastro-esophageal reflux disease without esophagitis: Secondary | ICD-10-CM

## 2022-03-28 DIAGNOSIS — E559 Vitamin D deficiency, unspecified: Secondary | ICD-10-CM

## 2022-03-28 DIAGNOSIS — E039 Hypothyroidism, unspecified: Secondary | ICD-10-CM | POA: Diagnosis not present

## 2022-03-28 DIAGNOSIS — E785 Hyperlipidemia, unspecified: Secondary | ICD-10-CM

## 2022-03-28 MED ORDER — AMLODIPINE BESYLATE 5 MG PO TABS: 5.0000 mg | ORAL_TABLET | Freq: Every day | ORAL | 3 refills | Status: DC

## 2022-03-28 MED ORDER — PANTOPRAZOLE SODIUM 40 MG PO TBEC: 40.0000 mg | DELAYED_RELEASE_TABLET | Freq: Every day | ORAL | 4 refills | Status: DC

## 2022-03-28 NOTE — Patient Instructions (Signed)
.   Please review the attached list of medications and notify my office if there are any errors.   . Please bring all of your medications to every appointment so we can make sure that our medication list is the same as yours.   

## 2022-03-29 LAB — T4, FREE: Free T4: 2.24 ng/dL — ABNORMAL HIGH (ref 0.82–1.77)

## 2022-03-29 LAB — LIPID PANEL
Chol/HDL Ratio: 4.9 ratio (ref 0.0–5.0)
Cholesterol, Total: 183 mg/dL (ref 100–199)
HDL: 37 mg/dL — ABNORMAL LOW (ref >39)
LDL Chol Calc (NIH): 118 mg/dL — ABNORMAL HIGH (ref 0–99)
Triglycerides: 157 mg/dL — ABNORMAL HIGH (ref 0–149)
VLDL Cholesterol Cal: 28 mg/dL (ref 5–40)

## 2022-03-29 LAB — VITAMIN D 25 HYDROXY (VIT D DEFICIENCY, FRACTURES): Vit D, 25-Hydroxy: 38.2 ng/mL (ref 30.0–100.0)

## 2022-03-29 LAB — RENAL FUNCTION PANEL
Albumin: 4.4 g/dL (ref 3.7–4.7)
BUN/Creatinine Ratio: 12 (ref 10–24)
BUN: 12 mg/dL (ref 8–27)
CO2: 23 mmol/L (ref 20–29)
Calcium: 9.3 mg/dL (ref 8.6–10.2)
Chloride: 106 mmol/L (ref 96–106)
Creatinine, Ser: 0.99 mg/dL (ref 0.76–1.27)
Glucose: 100 mg/dL — ABNORMAL HIGH (ref 70–99)
Phosphorus: 3 mg/dL (ref 2.8–4.1)
Potassium: 4 mmol/L (ref 3.5–5.2)
Sodium: 143 mmol/L (ref 134–144)
eGFR: 79 mL/min/{1.73_m2} (ref >59)

## 2022-03-29 LAB — TSH: TSH: 0.11 u[IU]/mL — ABNORMAL LOW (ref 0.450–4.500)

## 2022-03-31 ENCOUNTER — Other Ambulatory Visit: Payer: Self-pay | Admitting: Family Medicine

## 2022-03-31 MED ORDER — LEVOTHYROXINE SODIUM 125 MCG PO TABS: 125.0000 ug | ORAL_TABLET | Freq: Every day | ORAL | 1 refills | Status: DC

## 2022-04-16 DIAGNOSIS — L400 Psoriasis vulgaris: Secondary | ICD-10-CM | POA: Diagnosis not present

## 2022-04-16 DIAGNOSIS — L570 Actinic keratosis: Secondary | ICD-10-CM | POA: Diagnosis not present

## 2022-04-16 DIAGNOSIS — D2272 Melanocytic nevi of left lower limb, including hip: Secondary | ICD-10-CM | POA: Diagnosis not present

## 2022-04-16 DIAGNOSIS — Z85828 Personal history of other malignant neoplasm of skin: Secondary | ICD-10-CM | POA: Diagnosis not present

## 2022-04-16 DIAGNOSIS — D044 Carcinoma in situ of skin of scalp and neck: Secondary | ICD-10-CM | POA: Diagnosis not present

## 2022-04-16 DIAGNOSIS — D2262 Melanocytic nevi of left upper limb, including shoulder: Secondary | ICD-10-CM | POA: Diagnosis not present

## 2022-04-16 DIAGNOSIS — D485 Neoplasm of uncertain behavior of skin: Secondary | ICD-10-CM | POA: Diagnosis not present

## 2022-04-16 DIAGNOSIS — Z85820 Personal history of malignant melanoma of skin: Secondary | ICD-10-CM | POA: Diagnosis not present

## 2022-04-24 DIAGNOSIS — D044 Carcinoma in situ of skin of scalp and neck: Secondary | ICD-10-CM | POA: Diagnosis not present

## 2022-06-18 ENCOUNTER — Encounter: Payer: Self-pay | Admitting: Family Medicine

## 2022-06-18 ENCOUNTER — Ambulatory Visit (INDEPENDENT_AMBULATORY_CARE_PROVIDER_SITE_OTHER): Payer: Medicare Other | Admitting: Family Medicine

## 2022-06-18 VITALS — BP 135/58 | HR 77 | Temp 97.9°F | Resp 16 | Wt 189.0 lb

## 2022-06-18 DIAGNOSIS — I10 Essential (primary) hypertension: Secondary | ICD-10-CM

## 2022-06-18 DIAGNOSIS — E039 Hypothyroidism, unspecified: Secondary | ICD-10-CM | POA: Diagnosis not present

## 2022-06-18 NOTE — Progress Notes (Signed)
I,Roshena L Chambers,acting as a scribe for Lelon Huh, MD.,have documented all relevant documentation on the behalf of Lelon Huh, MD,as directed by  Lelon Huh, MD while in the presence of Lelon Huh, MD.   Established patient visit   [name] [facility]   DOB: [birth year]   76 y.o. Male  MRN: [id number] Visit Date: 06/18/2022  Today's healthcare provider: Lelon Huh, MD   Chief Complaint  Patient presents with   Hypertension   Hypothyroidism   Subjective    HPI  Hypertension, follow-up  BP Readings from Last 3 Encounters:  06/18/22 (!) 162/89  03/28/22 (!) 158/87  09/03/21 (!) 178/98   Wt Readings from Last 3 Encounters:  06/18/22 189 lb (85.7 kg)  03/28/22 193 lb (87.5 kg)  09/03/21 193 lb (87.5 kg)     He was last seen for hypertension 3 months ago.  BP at that visit was 158/87. Management since that visit includes continue same medication as he reported normal home blood pressure readings.   He reports good compliance with treatment. He is not having side effects.  He is following a Regular diet. He is exercising. He does not smoke.  Use of agents associated with hypertension: thyroid hormones.   Outside blood pressures are ranging between 120-140/ 65-80. Symptoms: No chest pain No chest pressure  No palpitations No syncope  No dyspnea No orthopnea  No paroxysmal nocturnal dyspnea No lower extremity edema   Pertinent labs  The 10-year ASCVD risk score (Arnett DK, et al., 2019) is: 44.3%  ---------------------------------------------------------------------------------------------------   Hypothyroid, follow-up  Lab Results  Component Value Date   TSH 0.110 (L) 03/28/2022   TSH 0.584 09/03/2021   TSH 3.170 11/21/2020   FREET4 2.24 (H) 03/28/2022   FREET4 1.86 (H) 09/03/2021    Wt Readings from Last 3 Encounters:  06/18/22 189 lb (85.7 kg)  03/28/22 193 lb (87.5 kg)  09/03/21 193 lb (87.5 kg)    He was last seen for  hypothyroid 3 months ago.  Management since that visit includes changing the dose of levothyroxine to 162mg daily. He reports good compliance with treatment. He is not having side effects.   Symptoms: No change in energy level No constipation  No diarrhea No heat / cold intolerance  No nervousness No palpitations  No weight changes    -----------------------------------------------------------------------------------------   Medications: Outpatient Medications Prior to Visit  Medication Sig   ALPRAZolam (XANAX) 0.5 MG tablet Take 0.5-1 tablets (0.25-0.5 mg total) by mouth 3 (three) times daily as needed for anxiety.   amLODipine (NORVASC) 5 MG tablet Take 1 tablet (5 mg total) by mouth daily.   augmented betamethasone dipropionate (DIPROLENE-AF) 0.05 % cream    Bacillus Coagulans-Inulin (PROBIOTIC) 1-250 BILLION-MG CAPS Take 1 capsule by mouth daily at 6 (six) AM.   Cholecalciferol (VITAMIN D3 PO) Take 1,000 Units by mouth daily.   diclofenac sodium (VOLTAREN) 1 % GEL Apply 4 g topically 4 (four) times daily as needed. (Patient taking differently: Apply 4 g topically 4 (four) times daily as needed (pain).)   fluticasone (FLONASE) 50 MCG/ACT nasal spray Place 2 sprays into both nostrils daily.   hydrocortisone (ANUSOL-HC) 2.5 % rectal cream APPLY TO AFFECTED AREAS 2 TO 3 TIMES DAILY AS NEEDED   levothyroxine (SYNTHROID) 125 MCG tablet Take 1 tablet (125 mcg total) by mouth daily.   meloxicam (MOBIC) 7.5 MG tablet Take 7.5 mg by mouth 2 (two) times daily.   Multiple Vitamins-Iron (MULTIVITAMIN/IRON PO) Take 1  tablet by mouth daily at 6 (six) AM.   pantoprazole (PROTONIX) 40 MG tablet Take 1 tablet (40 mg total) by mouth daily.   tamsulosin (FLOMAX) 0.4 MG CAPS capsule TAKE 1 CAPSULE EVERY DAY   No facility-administered medications prior to visit.    Review of Systems  Constitutional:  Negative for appetite change, chills and fever.  Respiratory:  Negative for chest tightness,  shortness of breath and wheezing.   Cardiovascular:  Negative for chest pain and palpitations.  Gastrointestinal:  Negative for abdominal pain, nausea and vomiting.       Objective    BP (!) 162/89 (BP Location: Left Arm, Patient Position: Sitting, Cuff Size: Large)   Pulse 77   Temp 97.9 F (36.6 C) (Oral)   Resp 16   Wt 189 lb (85.7 kg)   SpO2 99% Comment: room air  BMI 32.44 kg/m  Home reading this morning = 135/58   Today's Vitals   06/18/22 0958  BP: (!) 162/89  Pulse: 77  Resp: 16  Temp: 97.9 F (36.6 C)  TempSrc: Oral  SpO2: 99%  Weight: 189 lb (85.7 kg)   Body mass index is 32.44 kg/m.   Physical Exam   General appearance: Mildly obese male, cooperative and in no acute distress Head: Normocephalic, without obvious abnormality, atraumatic Respiratory: Respirations even and unlabored, normal respiratory rate Extremities: All extremities are intact.  Skin: Skin color, texture, turgor normal. No rashes seen  Psych: Appropriate mood and affect. Neurologic: Mental status: Alert, oriented to person, place, and time, thought content appropriate.     Assessment & Plan     1. Adult hypothyroidism Feels will since reduction in levothyroxine dose.  - TSH - T4, free  2. Primary hypertension Home BP which he checks with brachial cuff consistently under 140/80. Continue current medications.        The entirety of the information documented in the History of Present Illness, Review of Systems and Physical Exam were personally obtained by me. Portions of this information were initially documented by the CMA and reviewed by me for thoroughness and accuracy.     Lelon Huh, MD  [facility] [phone] (phone) [phone] (fax)  [place]

## 2022-06-19 ENCOUNTER — Other Ambulatory Visit: Payer: Self-pay

## 2022-06-19 ENCOUNTER — Other Ambulatory Visit: Payer: Self-pay | Admitting: Family Medicine

## 2022-06-19 DIAGNOSIS — E039 Hypothyroidism, unspecified: Secondary | ICD-10-CM

## 2022-06-19 DIAGNOSIS — N401 Enlarged prostate with lower urinary tract symptoms: Secondary | ICD-10-CM

## 2022-06-19 LAB — TSH: TSH: 0.143 u[IU]/mL — ABNORMAL LOW (ref 0.450–4.500)

## 2022-06-19 LAB — T4, FREE: Free T4: 1.8 ng/dL — ABNORMAL HIGH (ref 0.82–1.77)

## 2022-06-19 MED ORDER — LEVOTHYROXINE SODIUM 112 MCG PO TABS: 112.0000 ug | ORAL_TABLET | Freq: Every day | ORAL | 0 refills | Status: DC

## 2022-06-23 DIAGNOSIS — M1711 Unilateral primary osteoarthritis, right knee: Secondary | ICD-10-CM | POA: Diagnosis not present

## 2022-06-27 ENCOUNTER — Other Ambulatory Visit: Payer: Self-pay | Admitting: Family Medicine

## 2022-06-27 NOTE — Telephone Encounter (Signed)
Dosage changed to 90. Refilled 06/19/2022 #90. Requested Prescriptions  Pending Prescriptions Disp Refills  . levothyroxine (SYNTHROID) 125 MCG tablet [Pharmacy Med Name: LEVOTHYROXINE SODIUM 125 MCG TAB] 90 tablet 1    Sig: TAKE 1 TABLET BY MOUTH DAILY     Endocrinology:  Hypothyroid Agents Failed - 06/27/2022  4:52 PM      Failed - TSH in normal range and within 360 days    TSH  Date Value Ref Range Status  06/18/2022 0.143 (L) 0.450 - 4.500 uIU/mL Final         Passed - Valid encounter within last 12 months    Recent Outpatient Visits          1 week ago Adult hypothyroidism   [facility] Birdie Sons, MD   3 months ago Essential (primary) hypertension   [facility] Birdie Sons, MD   9 months ago Pneumonia due to infectious organism, unspecified laterality, unspecified part of lung   [facility] Birdie Sons, MD   1 year ago SBO (small bowel obstruction) [facility])   [facility] Birdie Sons, MD   1 year ago Abdominal pain, acute, epigastric   [place], Vickki Muff, PA-C      Future Appointments            In 2 months Fisher, Kirstie Peri, MD [facility] [place]

## 2022-07-08 DIAGNOSIS — M25661 Stiffness of right knee, not elsewhere classified: Secondary | ICD-10-CM | POA: Diagnosis not present

## 2022-07-22 DIAGNOSIS — M25661 Stiffness of right knee, not elsewhere classified: Secondary | ICD-10-CM | POA: Diagnosis not present

## 2022-07-29 ENCOUNTER — Other Ambulatory Visit: Payer: Self-pay | Admitting: Family Medicine

## 2022-07-29 DIAGNOSIS — E039 Hypothyroidism, unspecified: Secondary | ICD-10-CM

## 2022-08-18 DIAGNOSIS — Z23 Encounter for immunization: Secondary | ICD-10-CM | POA: Diagnosis not present

## 2022-09-08 ENCOUNTER — Other Ambulatory Visit: Payer: Self-pay | Admitting: Family Medicine

## 2022-09-08 DIAGNOSIS — K649 Unspecified hemorrhoids: Secondary | ICD-10-CM

## 2022-09-12 ENCOUNTER — Ambulatory Visit: Payer: Federal, State, Local not specified - PPO | Admitting: Family Medicine

## 2022-09-19 ENCOUNTER — Telehealth: Payer: Self-pay | Admitting: Family Medicine

## 2022-09-19 DIAGNOSIS — E039 Hypothyroidism, unspecified: Secondary | ICD-10-CM

## 2022-09-19 NOTE — Telephone Encounter (Signed)
Please let patient know order for thyroid functions is been entered so he can stop by lab Monday or Tuesday to get his blood drawn if he likes.

## 2022-09-22 DIAGNOSIS — E039 Hypothyroidism, unspecified: Secondary | ICD-10-CM | POA: Diagnosis not present

## 2022-09-22 NOTE — Telephone Encounter (Signed)
Patient called back and was advised that we did not need to speak with him because he came in this morning to have labs done.

## 2022-09-22 NOTE — Telephone Encounter (Signed)
[email or web address]

## 2022-09-23 LAB — T4, FREE: Free T4: 1.52 ng/dL (ref 0.82–1.77)

## 2022-09-23 LAB — TSH: TSH: 1.54 u[IU]/mL (ref 0.450–4.500)

## 2022-09-23 NOTE — Progress Notes (Unsigned)
I,Roshena L Chambers,acting as a scribe for Lelon Huh, MD.,have documented all relevant documentation on the behalf of Lelon Huh, MD,as directed by  Lelon Huh, MD while in the presence of Lelon Huh, MD.    Established patient visit   [name] [facility]   DOB: October 10, 1946   76 y.o. Male  MRN: [id number] Visit Date: 09/24/2022  Today's healthcare provider: Lelon Huh, MD   Chief Complaint  Patient presents with   Hypothyroidism   Subjective    HPI  Hypothyroid, follow-up  Lab Results  Component Value Date   TSH 1.540 09/22/2022   TSH 0.143 (L) 06/18/2022   TSH 0.110 (L) 03/28/2022   FREET4 1.52 09/22/2022   FREET4 1.80 (H) 06/18/2022    Wt Readings from Last 3 Encounters:  09/24/22 187 lb (84.8 kg)  06/18/22 189 lb (85.7 kg)  03/28/22 193 lb (87.5 kg)    He was last seen for hypothyroid 3 months ago.  Management since that visit includes; Feels well since reduction in levothyroxine dose.   He reports good compliance with treatment. He is not having side effects.   Symptoms: No change in energy level No constipation  No diarrhea No heat / cold intolerance  No nervousness No palpitations  No weight changes    -----------------------------------------------------------------------------------------   Medications: Outpatient Medications Prior to Visit  Medication Sig   ALPRAZolam (XANAX) 0.5 MG tablet Take 0.5-1 tablets (0.25-0.5 mg total) by mouth 3 (three) times daily as needed for anxiety.   amLODipine (NORVASC) 5 MG tablet Take 1 tablet (5 mg total) by mouth daily.   augmented betamethasone dipropionate (DIPROLENE-AF) 0.05 % cream    Bacillus Coagulans-Inulin (PROBIOTIC) 1-250 BILLION-MG CAPS Take 1 capsule by mouth daily at 6 (six) AM.   Cholecalciferol (VITAMIN D3 PO) Take 1,000 Units by mouth daily.   diclofenac sodium (VOLTAREN) 1 % GEL Apply 4 g topically 4 (four) times daily as needed. (Patient taking differently: Apply 4 g  topically 4 (four) times daily as needed (pain).)   fluticasone (FLONASE) 50 MCG/ACT nasal spray Place 2 sprays into both nostrils daily.   hydrocortisone (ANUSOL-HC) 2.5 % rectal cream APPLY TO AFFECTED AREAS 2 TO 3 TIMES DAILY AS NEEDED   levothyroxine (SYNTHROID) 112 MCG tablet TAKE ONE TABLET BY MOUTH EVERY DAY   meloxicam (MOBIC) 7.5 MG tablet Take 7.5 mg by mouth 2 (two) times daily.   Multiple Vitamins-Iron (MULTIVITAMIN/IRON PO) Take 1 tablet by mouth daily at 6 (six) AM.   pantoprazole (PROTONIX) 40 MG tablet Take 1 tablet (40 mg total) by mouth daily.   tamsulosin (FLOMAX) 0.4 MG CAPS capsule TAKE 1 CAPSULE EVERY DAY   No facility-administered medications prior to visit.    Review of Systems  Constitutional:  Negative for appetite change, chills and fever.  Respiratory:  Negative for chest tightness, shortness of breath and wheezing.   Cardiovascular:  Negative for chest pain and palpitations.  Gastrointestinal:  Negative for abdominal pain, nausea and vomiting.       Objective    BP 130/78 (BP Location: Left Arm, Patient Position: Sitting, Cuff Size: Large)   Pulse 68   Temp 97.8 F (36.6 C) (Oral)   Resp 16   Wt 187 lb (84.8 kg)   SpO2 98% Comment: room air  BMI 32.10 kg/m    Today's Vitals   09/24/22 0942 09/24/22 0954  BP: (!) 140/85 130/78  Pulse: 76 68  Resp: 16   Temp: 97.8 F (36.6 C)  TempSrc: Oral   SpO2: 98%   Weight: 187 lb (84.8 kg)    Body mass index is 32.1 kg/m.   Physical Exam    General: Appearance:    Mildly obese male in no acute distress  Eyes:    PERRL, conjunctiva/corneas clear, EOM's intact       Lungs:     Clear to auscultation bilaterally, respirations unlabored  Heart:    Normal heart rate. Normal rhythm. No murmurs, rubs, or gallops.    MS:   All extremities are intact.    Neurologic:   Awake, alert, oriented x 3. No apparent focal neurological defect.         Assessment & Plan    1. Adult hypothyroidism Is euthyroid  with normal thyroid labs on current dose of levothyroxine. refill levothyroxine (SYNTHROID) 112 MCG tablet; Take 1 tablet (112 mcg total) by mouth daily.  Dispense: 90 tablet; Refill: 4  2. Benign prostatic hyperplasia with urinary hesitancy Doing well on current dose tamsulosin (FLOMAX) 0.4 MG CAPS capsule; Take 1 capsule (0.4 mg total) by mouth daily.  Dispense: 90 capsule; Refill: 4  3. History of adenomatous polyp of colon Counseled that he is due for follow up colonoscopy and at increased for developing advanced colon cancer. He doesn't want to have any more colonoscopies, but agreed to consider it again at follow up visit.   Future Appointments  Date Time Provider [place]  03/23/2023  8:40 AM Birdie Sons, MD [facility]         The entirety of the information documented in the History of Present Illness, Review of Systems and Physical Exam were personally obtained by me. Portions of this information were initially documented by the CMA and reviewed by me for thoroughness and accuracy.     Lelon Huh, MD  [facility] [phone] (phone) [phone] (fax)  [place]

## 2022-09-24 ENCOUNTER — Encounter: Payer: Self-pay | Admitting: Family Medicine

## 2022-09-24 ENCOUNTER — Ambulatory Visit (INDEPENDENT_AMBULATORY_CARE_PROVIDER_SITE_OTHER): Payer: Medicare Other | Admitting: Family Medicine

## 2022-09-24 VITALS — BP 130/78 | HR 68 | Temp 97.8°F | Resp 16 | Wt 187.0 lb

## 2022-09-24 DIAGNOSIS — R3911 Hesitancy of micturition: Secondary | ICD-10-CM

## 2022-09-24 DIAGNOSIS — Z86010 Personal history of colonic polyps: Secondary | ICD-10-CM | POA: Diagnosis not present

## 2022-09-24 DIAGNOSIS — E039 Hypothyroidism, unspecified: Secondary | ICD-10-CM | POA: Diagnosis not present

## 2022-09-24 DIAGNOSIS — N401 Enlarged prostate with lower urinary tract symptoms: Secondary | ICD-10-CM | POA: Diagnosis not present

## 2022-09-24 MED ORDER — TAMSULOSIN HCL 0.4 MG PO CAPS: 0.4000 mg | ORAL_CAPSULE | Freq: Every day | ORAL | 4 refills | Status: DC

## 2022-09-24 MED ORDER — LEVOTHYROXINE SODIUM 112 MCG PO TABS: 112.0000 ug | ORAL_TABLET | Freq: Every day | ORAL | 4 refills | Status: DC

## 2022-09-24 NOTE — Patient Instructions (Signed)
.   Please review the attached list of medications and notify my office if there are any errors.   . Please bring all of your medications to every appointment so we can make sure that our medication list is the same as yours.   

## 2022-10-06 DIAGNOSIS — L570 Actinic keratosis: Secondary | ICD-10-CM | POA: Diagnosis not present

## 2022-10-06 DIAGNOSIS — L400 Psoriasis vulgaris: Secondary | ICD-10-CM | POA: Diagnosis not present

## 2022-10-06 DIAGNOSIS — D485 Neoplasm of uncertain behavior of skin: Secondary | ICD-10-CM | POA: Diagnosis not present

## 2022-10-06 DIAGNOSIS — D0439 Carcinoma in situ of skin of other parts of face: Secondary | ICD-10-CM | POA: Diagnosis not present

## 2022-10-06 DIAGNOSIS — D2271 Melanocytic nevi of right lower limb, including hip: Secondary | ICD-10-CM | POA: Diagnosis not present

## 2022-10-06 DIAGNOSIS — Z85828 Personal history of other malignant neoplasm of skin: Secondary | ICD-10-CM | POA: Diagnosis not present

## 2022-10-06 DIAGNOSIS — Z85820 Personal history of malignant melanoma of skin: Secondary | ICD-10-CM | POA: Diagnosis not present

## 2022-11-25 DIAGNOSIS — D0439 Carcinoma in situ of skin of other parts of face: Secondary | ICD-10-CM | POA: Diagnosis not present

## 2022-12-09 DIAGNOSIS — D692 Other nonthrombocytopenic purpura: Secondary | ICD-10-CM | POA: Diagnosis not present

## 2022-12-09 DIAGNOSIS — D485 Neoplasm of uncertain behavior of skin: Secondary | ICD-10-CM | POA: Diagnosis not present

## 2022-12-09 DIAGNOSIS — D0471 Carcinoma in situ of skin of right lower limb, including hip: Secondary | ICD-10-CM | POA: Diagnosis not present

## 2022-12-15 DIAGNOSIS — D0471 Carcinoma in situ of skin of right lower limb, including hip: Secondary | ICD-10-CM | POA: Diagnosis not present

## 2022-12-15 DIAGNOSIS — D485 Neoplasm of uncertain behavior of skin: Secondary | ICD-10-CM | POA: Diagnosis not present

## 2022-12-17 ENCOUNTER — Other Ambulatory Visit: Payer: Self-pay | Admitting: Family Medicine

## 2022-12-17 DIAGNOSIS — I10 Essential (primary) hypertension: Secondary | ICD-10-CM

## 2023-01-05 DIAGNOSIS — M1711 Unilateral primary osteoarthritis, right knee: Secondary | ICD-10-CM | POA: Diagnosis not present

## 2023-01-22 ENCOUNTER — Telehealth: Payer: Self-pay | Admitting: Family Medicine

## 2023-01-22 NOTE — Telephone Encounter (Signed)
Called patient to schedule Medicare Annual Wellness Visit (AWV). Left message for patient to call back and schedule Medicare Annual Wellness Visit (AWV).  Last date of AWV: 11/21/2022  Please schedule an appointment at any time with [facility].  If any questions, please contact me at [phone].  Thank you ,  [place] Direct Dial: [phone]

## 2023-01-22 NOTE — Telephone Encounter (Signed)
[place] to schedule their annual wellness visit. Appointment made for 03/02/2023.  [place] Direct Dial: [phone]

## 2023-03-02 ENCOUNTER — Ambulatory Visit (INDEPENDENT_AMBULATORY_CARE_PROVIDER_SITE_OTHER): Payer: Medicare Other

## 2023-03-02 VITALS — Ht 66.0 in | Wt 187.0 lb

## 2023-03-02 DIAGNOSIS — Z0000 Encounter for general adult medical examination without abnormal findings: Secondary | ICD-10-CM

## 2023-03-02 NOTE — Patient Instructions (Signed)
Christian Mora , Thank you for taking time to come for your Medicare Wellness Visit. I appreciate your ongoing commitment to your health goals. Please review the following plan we discussed and let me know if I can assist you in the future.   These are the goals we discussed:  Goals      DIET - EAT MORE FRUITS AND VEGETABLES     DIET - INCREASE WATER INTAKE     Recommend to drink at least 6-8 8oz glasses of water per day.        This is a list of the screening recommended for you and due dates:  Health Maintenance  Topic Date Due   Colon Cancer Screening  03/03/2021   COVID-19 Vaccine (4 - 2023-24 season) 07/04/2022   Flu Shot  06/04/2023   Medicare Annual Wellness Visit  03/01/2024   DTaP/Tdap/Td vaccine (2 - Td or Tdap) 11/11/2027   Pneumonia Vaccine  Completed   Hepatitis C Screening: USPSTF Recommendation to screen - Ages 65-79 yo.  Completed   Zoster (Shingles) Vaccine  Completed   HPV Vaccine  Aged Out   Cologuard (Stool DNA test)  Discontinued    Advanced directives: yes  Conditions/risks identified: none  Next appointment: Follow up in one year for your annual wellness visit. 03/02/2024 @11 :15 telephone  Preventive Care 65 Years and Older, Male  Preventive care refers to lifestyle choices and visits with your health care provider that can promote health and wellness. What does preventive care include? A yearly physical exam. This is also called an annual well check. Dental exams once or twice a year. Routine eye exams. Ask your health care provider how often you should have your eyes checked. Personal lifestyle choices, including: Daily care of your teeth and gums. Regular physical activity. Eating a healthy diet. Avoiding tobacco and drug use. Limiting alcohol use. Practicing safe sex. Taking low doses of aspirin every day. Taking vitamin and mineral supplements as recommended by your health care provider. What happens during an annual well check? The services  and screenings done by your health care provider during your annual well check will depend on your age, overall health, lifestyle risk factors, and family history of disease. Counseling  Your health care provider may ask you questions about your: Alcohol use. Tobacco use. Drug use. Emotional well-being. Home and relationship well-being. Sexual activity. Eating habits. History of falls. Memory and ability to understand (cognition). Work and work Astronomer. Screening  You may have the following tests or measurements: Height, weight, and BMI. Blood pressure. Lipid and cholesterol levels. These may be checked every 5 years, or more frequently if you are over 38 years old. Skin check. Lung cancer screening. You may have this screening every year starting at age 86 if you have a 30-pack-year history of smoking and currently smoke or have quit within the past 15 years. Fecal occult blood test (FOBT) of the stool. You may have this test every year starting at age 63. Flexible sigmoidoscopy or colonoscopy. You may have a sigmoidoscopy every 5 years or a colonoscopy every 10 years starting at age 35. Prostate cancer screening. Recommendations will vary depending on your family history and other risks. Hepatitis C blood test. Hepatitis B blood test. Sexually transmitted disease (STD) testing. Diabetes screening. This is done by checking your blood sugar (glucose) after you have not eaten for a while (fasting). You may have this done every 1-3 years. Abdominal aortic aneurysm (AAA) screening. You may need this if you  are a current or former smoker. Osteoporosis. You may be screened starting at age 26 if you are at high risk. Talk with your health care provider about your test results, treatment options, and if necessary, the need for more tests. Vaccines  Your health care provider may recommend certain vaccines, such as: Influenza vaccine. This is recommended every year. Tetanus, diphtheria,  and acellular pertussis (Tdap, Td) vaccine. You may need a Td booster every 10 years. Zoster vaccine. You may need this after age 29. Pneumococcal 13-valent conjugate (PCV13) vaccine. One dose is recommended after age 67. Pneumococcal polysaccharide (PPSV23) vaccine. One dose is recommended after age 51. Talk to your health care provider about which screenings and vaccines you need and how often you need them. This information is not intended to replace advice given to you by your health care provider. Make sure you discuss any questions you have with your health care provider. Document Released: 11/16/2015 Document Revised: 07/09/2016 Document Reviewed: 08/21/2015 Elsevier Interactive Patient Education  2017 [employer] Prevention in the Home Falls can cause injuries. They can happen to people of all ages. There are many things you can do to make your home safe and to help prevent falls. What can I do on the outside of my home? Regularly fix the edges of walkways and driveways and fix any cracks. Remove anything that might make you trip as you walk through a door, such as a raised step or threshold. Trim any bushes or trees on the path to your home. Use bright outdoor lighting. Clear any walking paths of anything that might make someone trip, such as rocks or tools. Regularly check to see if handrails are loose or broken. Make sure that both sides of any steps have handrails. Any raised decks and porches should have guardrails on the edges. Have any leaves, snow, or ice cleared regularly. Use sand or salt on walking paths during winter. Clean up any spills in your garage right away. This includes oil or grease spills. What can I do in the bathroom? Use night lights. Install grab bars by the toilet and in the tub and shower. Do not use towel bars as grab bars. Use non-skid mats or decals in the tub or shower. If you need to sit down in the shower, use a plastic, non-slip  stool. Keep the floor dry. Clean up any water that spills on the floor as soon as it happens. Remove soap buildup in the tub or shower regularly. Attach bath mats securely with double-sided non-slip rug tape. Do not have throw rugs and other things on the floor that can make you trip. What can I do in the bedroom? Use night lights. Make sure that you have a light by your bed that is easy to reach. Do not use any sheets or blankets that are too big for your bed. They should not hang down onto the floor. Have a firm chair that has side arms. You can use this for support while you get dressed. Do not have throw rugs and other things on the floor that can make you trip. What can I do in the kitchen? Clean up any spills right away. Avoid walking on wet floors. Keep items that you use a lot in easy-to-reach places. If you need to reach something above you, use a strong step stool that has a grab bar. Keep electrical cords out of the way. Do not use floor polish or wax that makes floors slippery. If you  must use wax, use non-skid floor wax. Do not have throw rugs and other things on the floor that can make you trip. What can I do with my stairs? Do not leave any items on the stairs. Make sure that there are handrails on both sides of the stairs and use them. Fix handrails that are broken or loose. Make sure that handrails are as long as the stairways. Check any carpeting to make sure that it is firmly attached to the stairs. Fix any carpet that is loose or worn. Avoid having throw rugs at the top or bottom of the stairs. If you do have throw rugs, attach them to the floor with carpet tape. Make sure that you have a light switch at the top of the stairs and the bottom of the stairs. If you do not have them, ask someone to add them for you. What else can I do to help prevent falls? Wear shoes that: Do not have high heels. Have rubber bottoms. Are comfortable and fit you well. Are closed at the  toe. Do not wear sandals. If you use a stepladder: Make sure that it is fully opened. Do not climb a closed stepladder. Make sure that both sides of the stepladder are locked into place. Ask someone to hold it for you, if possible. Clearly mark and make sure that you can see: Any grab bars or handrails. First and last steps. Where the edge of each step is. Use tools that help you move around (mobility aids) if they are needed. These include: Canes. Walkers. Scooters. Crutches. Turn on the lights when you go into a dark area. Replace any light bulbs as soon as they burn out. Set up your furniture so you have a clear path. Avoid moving your furniture around. If any of your floors are uneven, fix them. If there are any pets around you, be aware of where they are. Review your medicines with your doctor. Some medicines can make you feel dizzy. This can increase your chance of falling. Ask your doctor what other things that you can do to help prevent falls. This information is not intended to replace advice given to you by your health care provider. Make sure you discuss any questions you have with your health care provider. Document Released: 08/16/2009 Document Revised: 03/27/2016 Document Reviewed: 11/24/2014 Elsevier Interactive Patient Education  2017 [employer].

## 2023-03-02 NOTE — Progress Notes (Signed)
I connected with  Heywood Iles on 03/02/23 by a audio enabled telemedicine application and verified that I am speaking with the correct person using two identifiers.  Patient Location: Home  Provider Location: Home Office  I discussed the limitations of evaluation and management by telemedicine. The patient expressed understanding and agreed to proceed.  Subjective:   Christian Mora is a 77 y.o. male who presents for Medicare Annual/Subsequent preventive examination.  Review of Systems    Cardiac Risk Factors include: advanced age (>66men, >17 women);dyslipidemia;hypertension;male gender;obesity (BMI >30kg/m2)     Objective:    Today's Vitals   03/02/23 1055  Weight: 187 lb (84.8 kg)  Height: 5\' 6"  (1.676 m)   Body mass index is 30.18 kg/m.     03/02/2023   10:59 AM 11/21/2021   11:13 AM 11/14/2020   10:26 AM 10/17/2020    8:02 PM 10/16/2020    2:29 PM 11/07/2019   10:17 AM 11/05/2018    9:23 AM  Advanced Directives  Does Patient Have a Medical Advance Directive? Yes Yes Yes Yes No Yes Yes  Type of Special educational needs teacher of [employer] Power of [place];Living will   Living will;Healthcare Power of [employer] Power of [place];Living will  Does patient want to make changes to medical advance directive?  Yes (Inpatient - patient defers changing a medical advance directive and declines information at this time)       Copy of Healthcare Power of Attorney in Chart?  No - copy requested Yes - validated most recent copy scanned in chart (See row information)   Yes - validated most recent copy scanned in chart (See row information) Yes - validated most recent copy scanned in chart (See row information)  Would patient like information on creating a medical advance directive?     No - Patient declined      Current Medications (verified) Outpatient Encounter Medications as of 03/02/2023  Medication Sig   ALPRAZolam (XANAX) 0.5 MG tablet Take 0.5-1 tablets  (0.25-0.5 mg total) by mouth 3 (three) times daily as needed for anxiety.   amLODipine (NORVASC) 5 MG tablet TAKE 1 TABLET BY MOUTH DAILY   augmented betamethasone dipropionate (DIPROLENE-AF) 0.05 % cream    Bacillus Coagulans-Inulin (PROBIOTIC) 1-250 BILLION-MG CAPS Take 1 capsule by mouth daily at 6 (six) AM.   Cholecalciferol (VITAMIN D3 PO) Take 1,000 Units by mouth daily.   diclofenac sodium (VOLTAREN) 1 % GEL Apply 4 g topically 4 (four) times daily as needed. (Patient taking differently: Apply 4 g topically 4 (four) times daily as needed (pain).)   fluticasone (FLONASE) 50 MCG/ACT nasal spray Place 2 sprays into both nostrils daily.   hydrocortisone (ANUSOL-HC) 2.5 % rectal cream APPLY TO AFFECTED AREAS 2 TO 3 TIMES DAILY AS NEEDED   levothyroxine (SYNTHROID) 112 MCG tablet Take 1 tablet (112 mcg total) by mouth daily.   Multiple Vitamins-Iron (MULTIVITAMIN/IRON PO) Take 1 tablet by mouth daily at 6 (six) AM.   pantoprazole (PROTONIX) 40 MG tablet Take 1 tablet (40 mg total) by mouth daily.   tamsulosin (FLOMAX) 0.4 MG CAPS capsule Take 1 capsule (0.4 mg total) by mouth daily.   meloxicam (MOBIC) 7.5 MG tablet Take 7.5 mg by mouth 2 (two) times daily.   No facility-administered encounter medications on file as of 03/02/2023.    Allergies (verified) Patient has no known allergies.   History: Past Medical History:  Diagnosis Date   Allergy    Anemia  Anxiety    Diverticulitis    GERD (gastroesophageal reflux disease)    Hyperlipidemia    Hypertension    Hypothyroidism    Iron deficiency anemia 01/26/2018   Lab Results  Component  Value  Date     FERRITIN  5 (L)  01/22/2018         Lab Results  Component  Value  Date     WBC  4.2  01/22/2018     HGB  9.1 (L)  01/22/2018     HCT  31.7 (L)  01/22/2018     MCV  73 (L)  01/22/2018     PLT  164  01/22/2018         Melanoma of skin (HCC) 03/13/2003   SBO (small bowel obstruction) (HCC) 10/16/2020   Thyroid disease    Past Surgical  History:  Procedure Laterality Date   COLONOSCOPY WITH PROPOFOL N/A 03/03/2018   Procedure: COLONOSCOPY WITH PROPOFOL;  Surgeon: Wyline Mood, MD;  Location: [facility] ENDOSCOPY;  Service: Gastroenterology;  Laterality: N/A;   ESOPHAGOGASTRODUODENOSCOPY (EGD) WITH PROPOFOL N/A 03/03/2018   Procedure: ESOPHAGOGASTRODUODENOSCOPY (EGD) WITH PROPOFOL;  Surgeon: Wyline Mood, MD;  Location: [facility] ENDOSCOPY;  Service: Gastroenterology;  Laterality: N/A;   GIVENS CAPSULE STUDY N/A 03/12/2018   Procedure: GIVENS CAPSULE STUDY;  Surgeon: Wyline Mood, MD;  Location: [facility] ENDOSCOPY;  Service: Gastroenterology;  Laterality: N/A;   lymph node removed  11/03/2002   TONSILLECTOMY AND ADENOIDECTOMY  11/03/1952   Family History  Problem Relation Age of Onset   Hypertension Mother    Cancer Father        prostate   Diabetes Son    Social History   Socioeconomic History   Marital status: Married    Spouse name: Not on file   Number of children: 2   Years of education: Not on file   Highest education level: Bachelor's degree (e.g., BA, AB, BS)  Occupational History   Occupation: retired  Tobacco Use   Smoking status: Never   Smokeless tobacco: Never  Vaping Use   Vaping Use: Never used  Substance and Sexual Activity   Alcohol use: No   Drug use: No   Sexual activity: Not Currently  Other Topics Concern   Not on file  Social History Narrative   Not on file   Social Determinants of Health   Financial Resource Strain: Low Risk  (02/27/2023)   Overall Financial Resource Strain (CARDIA)    Difficulty of Paying Living Expenses: Not hard at all  Food Insecurity: No Food Insecurity (02/27/2023)   Hunger Vital Sign    Worried About Running Out of Food in the Last Year: Never true    Ran Out of Food in the Last Year: Never true  Transportation Needs: No Transportation Needs (02/27/2023)   PRAPARE - Administrator, Civil Service (Medical): No    Lack of Transportation (Non-Medical): No   Physical Activity: Insufficiently Active (02/27/2023)   Exercise Vital Sign    Days of Exercise per Week: 2 days    Minutes of Exercise per Session: 20 min  Stress: No Stress Concern Present (02/27/2023)   [employer] of Occupational Health - Occupational Stress Questionnaire    Feeling of Stress : Only a little  Social Connections: Unknown (02/27/2023)   Social Connection and Isolation Panel [NHANES]    Frequency of Communication with Friends and Family: Three times a week    Frequency of Social Gatherings with Friends and Family: Once a week  Attends Religious Services: Not on file    Active Member of Clubs or Organizations: Yes    Attends Banker Meetings: More than 4 times per year    Marital Status: Married    Tobacco Counseling Counseling given: Not Answered   Clinical Intake:  Pre-visit preparation completed: Yes  Pain : No/denies pain     BMI - recorded: 30.18 Nutritional Status: BMI > 30  Obese Nutritional Risks: None Diabetes: No  How often do you need to have someone help you when you read instructions, pamphlets, or other written materials from your doctor or pharmacy?: 1 - Never  Diabetic?no  Interpreter Needed?: No  Information entered by :: B.Edra Riccardi,LPN   Activities of Daily Living    02/27/2023    1:34 AM  In your present state of health, do you have any difficulty performing the following activities:  Hearing? 1  Vision? 0  Difficulty concentrating or making decisions? 0  Walking or climbing stairs? 1  Dressing or bathing? 0  Doing errands, shopping? 0  Preparing Food and eating ? N  Using the Toilet? N  In the past six months, have you accidently leaked urine? N  Do you have problems with loss of bowel control? N  Managing your Medications? N  Managing your Finances? N  Housekeeping or managing your Housekeeping? N    Patient Care Team: Malva Limes, MD as PCP - General (Family Medicine) Dasher, Cliffton Asters, MD  (Dermatology) Galen Manila, MD as Referring Physician (Ophthalmology) Fransico Michael, MD as Referring Physician (Audiology) Leafy Ro, MD as Consulting Physician (General Surgery) Juanell Fairly, MD as Consulting Physician (Orthopedic Surgery)  Indicate any recent Medical Services you may have received from other than [facility] providers in the past year (date may be approximate).     Assessment:   This is a routine wellness examination for Christian Mora.  Hearing/Vision screen Hearing Screening - Comments:: Adequate hearing:bilateral hearing aids  Vision Screening - Comments:: Adequate vision w/glasses [facility]  Dietary issues and exercise activities discussed: Current Exercise Habits: Home exercise routine, Type of exercise: walking, Time (Minutes): 20, Frequency (Times/Week): 2, Weekly Exercise (Minutes/Week): 40, Intensity: Mild, Exercise limited by: orthopedic condition(s)   Goals Addressed             This Visit's Progress    DIET - EAT MORE FRUITS AND VEGETABLES   On track    DIET - INCREASE WATER INTAKE   On track    Recommend to drink at least 6-8 8oz glasses of water per day.       Depression Screen    03/02/2023   10:58 AM 11/21/2021   11:11 AM 11/14/2020   10:22 AM 11/07/2019   10:18 AM 11/07/2019   10:14 AM 11/05/2018    9:24 AM 11/05/2017    9:10 AM  PHQ 2/9 Scores  PHQ - 2 Score 0 0 0 0 0 0 0    Fall Risk    02/27/2023    1:34 AM 11/21/2021   11:14 AM 11/20/2021   12:20 PM 11/18/2021    2:14 PM 01/16/2021    8:49 AM  Fall Risk   Falls in the past year? 0 0 0 0 0  Number falls in past yr:  0 0 0   Injury with Fall? 0 0 0 0   Risk for fall due to :  No Fall Risks     Follow up  Falls evaluation completed  FALL RISK PREVENTION PERTAINING TO THE HOME:  Any stairs in or around the home? Yes  If so, are there any without handrails? Yes  Home free of loose throw rugs in walkways, pet beds, electrical cords, etc? Yes  Adequate  lighting in your home to reduce risk of falls? Yes   ASSISTIVE DEVICES UTILIZED TO PREVENT FALLS:  Life alert? No  Use of a cane, walker or w/c? No  Grab bars in the bathroom? No  Shower chair or bench in shower? No  Elevated toilet seat or a handicapped toilet? Yes    Cognitive Function:        03/02/2023   11:02 AM 11/05/2018    9:32 AM  6CIT Screen  What Year? 0 points 0 points  What month? 0 points 0 points  What time? 0 points 0 points  Count back from 20 0 points 0 points  Months in reverse 0 points 0 points  Repeat phrase 0 points 0 points  Total Score 0 points 0 points    Immunizations Immunization History  Administered Date(s) Administered   Influenza Split 10/06/2007, 08/10/2009, 08/03/2010, 08/13/2011, 08/13/2012   Influenza, High Dose Seasonal PF 09/14/2014, 07/30/2015, 08/12/2016, 07/23/2018, 07/15/2019, 07/30/2020, 07/31/2022   Influenza,inj,Quad PF,6+ Mos 07/20/2013   Influenza,inj,Quad PF,6-35 Mos 07/25/2021   Influenza-Unspecified 08/10/2017   Moderna Sars-Covid-2 Vaccination 12/16/2019, 01/16/2020, 08/27/2020   Pneumococcal Conjugate-13 09/14/2014   Pneumococcal Polysaccharide-23 08/13/2012   Respiratory Syncytial Virus Vaccine,Recomb Aduvanted(Arexvy) 09/09/2022   Tdap 11/10/2017   Zoster Recombinat (Shingrix) 01/27/2018, 04/22/2018   Zoster, Live 08/29/2013    TDAP status: Up to date  Flu Vaccine status: Up to date  Pneumococcal vaccine status: Up to date  Covid-19 vaccine status: Completed vaccines  Qualifies for Shingles Vaccine? Yes   Zostavax completed Yes   Shingrix Completed?: Yes  Screening Tests Health Maintenance  Topic Date Due   COLONOSCOPY (Pts 45-61yrs Insurance coverage will need to be confirmed)  03/03/2021   COVID-19 Vaccine (4 - 2023-24 season) 07/04/2022   INFLUENZA VACCINE  06/04/2023   Medicare Annual Wellness (AWV)  03/01/2024   DTaP/Tdap/Td (2 - Td or Tdap) 11/11/2027   Pneumonia Vaccine 66+ Years old  Completed    Hepatitis C Screening  Completed   Zoster Vaccines- Shingrix  Completed   HPV VACCINES  Aged Out   Fecal DNA (Cologuard)  Discontinued    Health Maintenance  Health Maintenance Due  Topic Date Due   COLONOSCOPY (Pts 45-52yrs Insurance coverage will need to be confirmed)  03/03/2021   COVID-19 Vaccine (4 - 2023-24 season) 07/04/2022    Colorectal cancer screening: No longer required.   Lung Cancer Screening: (Low Dose CT Chest recommended if Age 18-80 years, 30 pack-year currently smoking OR have quit w/in 15years.) does not qualify.   Lung Cancer Screening Referral: no  Additional Screening:  Hepatitis C Screening: does not qualify; Completed no  Vision Screening: Recommended annual ophthalmology exams for early detection of glaucoma and other disorders of the eye. Is the patient up to date with their annual eye exam?  Yes  Who is the provider or what is the name of the office in which the patient attends annual eye exams?  If pt is not established with a provider, would they like to be referred to a provider to establish care? No .   Dental Screening: Recommended annual dental exams for proper oral hygiene  Community Resource Referral / Chronic Care Management: CRR required this visit?  No   CCM required this  visit?  No      Plan:     I have personally reviewed and noted the following in the patient's chart:   Medical and social history Use of alcohol, tobacco or illicit drugs  Current medications and supplements including opioid prescriptions. Patient is not currently taking opioid prescriptions. Functional ability and status Nutritional status Physical activity Advanced directives List of other physicians Hospitalizations, surgeries, and ER visits in previous 12 months Vitals Screenings to include cognitive, depression, and falls Referrals and appointments  In addition, I have reviewed and discussed with patient certain preventive protocols, quality  metrics, and best practice recommendations. A written personalized care plan for preventive services as well as general preventive health recommendations were provided to patient.     Sue Lush, LPN   1/61/0960   Nurse Notes: The patient states he is doing well and has no concerns or questions at this time.

## 2023-03-10 ENCOUNTER — Other Ambulatory Visit: Payer: Self-pay | Admitting: Family Medicine

## 2023-03-10 DIAGNOSIS — I10 Essential (primary) hypertension: Secondary | ICD-10-CM

## 2023-03-23 ENCOUNTER — Ambulatory Visit: Payer: Medicare Other | Admitting: Family Medicine

## 2023-04-02 DIAGNOSIS — H2513 Age-related nuclear cataract, bilateral: Secondary | ICD-10-CM | POA: Diagnosis not present

## 2023-04-02 DIAGNOSIS — H43813 Vitreous degeneration, bilateral: Secondary | ICD-10-CM | POA: Diagnosis not present

## 2023-04-09 DIAGNOSIS — D2261 Melanocytic nevi of right upper limb, including shoulder: Secondary | ICD-10-CM | POA: Diagnosis not present

## 2023-04-09 DIAGNOSIS — D2272 Melanocytic nevi of left lower limb, including hip: Secondary | ICD-10-CM | POA: Diagnosis not present

## 2023-04-09 DIAGNOSIS — D485 Neoplasm of uncertain behavior of skin: Secondary | ICD-10-CM | POA: Diagnosis not present

## 2023-04-09 DIAGNOSIS — L821 Other seborrheic keratosis: Secondary | ICD-10-CM | POA: Diagnosis not present

## 2023-04-09 DIAGNOSIS — D045 Carcinoma in situ of skin of trunk: Secondary | ICD-10-CM | POA: Diagnosis not present

## 2023-04-09 DIAGNOSIS — D225 Melanocytic nevi of trunk: Secondary | ICD-10-CM | POA: Diagnosis not present

## 2023-04-09 DIAGNOSIS — D2262 Melanocytic nevi of left upper limb, including shoulder: Secondary | ICD-10-CM | POA: Diagnosis not present

## 2023-04-09 DIAGNOSIS — L570 Actinic keratosis: Secondary | ICD-10-CM | POA: Diagnosis not present

## 2023-04-09 DIAGNOSIS — Z85820 Personal history of malignant melanoma of skin: Secondary | ICD-10-CM | POA: Diagnosis not present

## 2023-04-09 DIAGNOSIS — Z85828 Personal history of other malignant neoplasm of skin: Secondary | ICD-10-CM | POA: Diagnosis not present

## 2023-04-09 DIAGNOSIS — Z872 Personal history of diseases of the skin and subcutaneous tissue: Secondary | ICD-10-CM | POA: Diagnosis not present

## 2023-04-09 DIAGNOSIS — Z09 Encounter for follow-up examination after completed treatment for conditions other than malignant neoplasm: Secondary | ICD-10-CM | POA: Diagnosis not present

## 2023-05-04 ENCOUNTER — Encounter: Payer: Self-pay | Admitting: Family Medicine

## 2023-05-04 ENCOUNTER — Ambulatory Visit (INDEPENDENT_AMBULATORY_CARE_PROVIDER_SITE_OTHER): Payer: Medicare Other | Admitting: Family Medicine

## 2023-05-04 VITALS — BP 167/98 | HR 83 | Ht 65.0 in | Wt 190.0 lb

## 2023-05-04 DIAGNOSIS — I10 Essential (primary) hypertension: Secondary | ICD-10-CM

## 2023-05-04 DIAGNOSIS — K219 Gastro-esophageal reflux disease without esophagitis: Secondary | ICD-10-CM | POA: Diagnosis not present

## 2023-05-04 DIAGNOSIS — E559 Vitamin D deficiency, unspecified: Secondary | ICD-10-CM | POA: Diagnosis not present

## 2023-05-04 DIAGNOSIS — F419 Anxiety disorder, unspecified: Secondary | ICD-10-CM

## 2023-05-04 DIAGNOSIS — Z125 Encounter for screening for malignant neoplasm of prostate: Secondary | ICD-10-CM | POA: Diagnosis not present

## 2023-05-04 DIAGNOSIS — E039 Hypothyroidism, unspecified: Secondary | ICD-10-CM | POA: Diagnosis not present

## 2023-05-04 DIAGNOSIS — E785 Hyperlipidemia, unspecified: Secondary | ICD-10-CM

## 2023-05-04 MED ORDER — ALPRAZOLAM 0.5 MG PO TABS: 0.2500 mg | ORAL_TABLET | Freq: Three times a day (TID) | ORAL | 5 refills | Status: DC | PRN

## 2023-05-04 MED ORDER — AMLODIPINE BESYLATE 10 MG PO TABS: 10.0000 mg | ORAL_TABLET | Freq: Every day | ORAL | 2 refills | Status: DC

## 2023-05-04 MED ORDER — PANTOPRAZOLE SODIUM 40 MG PO TBEC: 40.0000 mg | DELAYED_RELEASE_TABLET | Freq: Every day | ORAL | 4 refills | Status: DC

## 2023-05-04 NOTE — Progress Notes (Signed)
Established patient visit   [name]   DOB: [birth year]   77 y.o. Male  MRN: [id number] Visit Date: 05/04/2023  Today's healthcare provider: Mila Merry, MD   Chief Complaint  Patient presents with   Follow-up    6 months f/u   Subjective    Discussed the use of AI scribe software for clinical note transcription with the patient, who gave verbal consent to proceed.  History of Present Illness   The patient, with a history of skin cancer and arthritis, presents for a checkup primarily for hypothyroid and hypertension. He reports feeling fatigued and unusually susceptible to cold, which he suspects may be due to underperformance of his thyroid. He is currently on levothyroxine, but feels the dosage may need adjustment.  The patient also discusses his ongoing battle with skin cancer. 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 Recently, four new spots have appeared on his chest and back, which are currently being treated with cancer cream. The patient is concerned about the rapid appearance of new spots and wonders if his immune system is not functioning properly.  In addition to skin cancer, the patient is dealing with arthritis in his right knee. He has been receiving injections for pain relief, which typically last for four to six months. However, the pain has been particularly severe in the past three to four weeks. He has an upcoming appointment with his orthopedist, where he anticipates receiving another injection and possibly discussing surgery. Has also been dealing with a great deal of stress since his 80 year mothers health has declined steadily the last several years. He does have h/o anxiety prescribed alprazolam, which he rarely takes, although has had to take a bit more often the last few months.   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 He reports his blood pressure at home has been in the range of 120-140/70-80, but it was 146/87 on the day of the appointment and 164/78 during the appointment. He is currently on amlodipine 0.5mg  and is considering increasing the dosage to 0.10mg .       Medications: Outpatient Medications Prior to Visit  Medication Sig   augmented betamethasone dipropionate (DIPROLENE-AF) 0.05 % cream    Bacillus Coagulans-Inulin (PROBIOTIC) 1-250 BILLION-MG CAPS Take 1 capsule by mouth daily at 6 (six) AM.   Cholecalciferol (VITAMIN D3 PO) Take 1,000 Units by mouth daily.   diclofenac sodium (VOLTAREN) 1 % GEL Apply 4 g topically 4 (four) times daily as needed. (Patient taking differently: Apply 4 g topically 4 (four) times daily as needed (pain).)   fluticasone (FLONASE) 50 MCG/ACT nasal spray Place 2 sprays into both nostrils daily.   hydrocortisone (ANUSOL-HC) 2.5 % rectal cream APPLY TO AFFECTED AREAS 2 TO 3 TIMES DAILY AS NEEDED   levothyroxine (SYNTHROID) 112 MCG tablet Take 1 tablet (112 mcg total) by mouth daily.   Multiple Vitamins-Iron (MULTIVITAMIN/IRON PO) Take 1 tablet by mouth daily at 6 (six) AM.   tamsulosin (FLOMAX) 0.4 MG CAPS capsule Take 1 capsule (0.4 mg total) by mouth daily.   [DISCONTINUED] ALPRAZolam (XANAX) 0.5 MG tablet Take 0.5-1 tablets (0.25-0.5 mg total) by mouth 3 (three) times daily as needed for anxiety.   [DISCONTINUED] amLODipine (NORVASC) 5 MG tablet TAKE 1 TABLET BY MOUTH DAILY   [  DISCONTINUED] pantoprazole (PROTONIX) 40 MG tablet Take 1 tablet (40 mg total) by mouth daily.   [DISCONTINUED] meloxicam (MOBIC) 7.5 MG tablet Take 7.5 mg by mouth 2 (two) times daily.   No facility-administered medications prior to visit.       Objective    BP (!) 167/98   Pulse 83   Ht 5\' 5"  (1.651 m)   Wt 190 lb (86.2 kg)   SpO2 96%   BMI 31.62 kg/m    Physical Exam   General: Appearance:    Mildly  obese male in no acute distress  Eyes:    PERRL, conjunctiva/corneas clear, EOM's intact       Lungs:     Clear to auscultation bilaterally, respirations unlabored  Heart:    Normal heart rate. Normal rhythm. No murmurs, rubs, or gallops.    MS:   All extremities are intact.    Neurologic:   Awake, alert, oriented x 3. No apparent focal neurological defect.         Assessment & Plan     1. Adult hypothyroidism Needs refill levothyroxine, will hold off until lab results are in  - T4, free - TSH  2. Anxiety Worse with his 57 year old mother's declining health.  - ALPRAZolam (XANAX) 0.5 MG tablet; Take 0.5-1 tablets (0.25-0.5 mg total) by mouth 3 (three) times daily as needed for anxiety.  Dispense: 90 tablet; Refill: 5  3. Essential (primary) hypertension Uncontrolled, largely related to stress. Increase from 5mg  to amLODipine (NORVASC) 10 MG tablet; Take 1 tablet (10 mg total) by mouth daily.  Dispense: 90 tablet; Refill: 2 - CBC - Comprehensive metabolic panel  4. Gastroesophageal reflux disease without esophagitis refill pantoprazole (PROTONIX) 40 MG tablet; Take 1 tablet (40 mg total) by mouth daily.  Dispense: 90 tablet; Refill: 4  5. Hyperlipidemia, unspecified hyperlipidemia type Diet controlled.  - Lipid panel  6. Vitamin D deficiency On vitamin D supplement.  - VITAMIN D 25 Hydroxy (Vit-D Deficiency, Fractures)  7.  Prostate cancer screening  - PSA Total (Reflex To Free)    Return in about 2 months (around 07/05/2023).        Mila Merry, MD  [place] [phone] (phone) [phone] (fax)  [facility]

## 2023-05-04 NOTE — Patient Instructions (Signed)
.   Please review the attached list of medications and notify my office if there are any errors.   . Please bring all of your medications to every appointment so we can make sure that our medication list is the same as yours.   

## 2023-05-05 LAB — COMPREHENSIVE METABOLIC PANEL
ALT: 18 IU/L (ref 0–44)
AST: 20 IU/L (ref 0–40)
Albumin: 4.2 g/dL (ref 3.8–4.8)
Alkaline Phosphatase: 69 IU/L (ref 44–121)
BUN/Creatinine Ratio: 10 (ref 10–24)
BUN: 9 mg/dL (ref 8–27)
Bilirubin Total: 0.8 mg/dL (ref 0.0–1.2)
CO2: 22 mmol/L (ref 20–29)
Calcium: 9.1 mg/dL (ref 8.6–10.2)
Chloride: 104 mmol/L (ref 96–106)
Creatinine, Ser: 0.9 mg/dL (ref 0.76–1.27)
Globulin, Total: 3 g/dL (ref 1.5–4.5)
Glucose: 100 mg/dL — ABNORMAL HIGH (ref 70–99)
Potassium: 4 mmol/L (ref 3.5–5.2)
Sodium: 139 mmol/L (ref 134–144)
Total Protein: 7.2 g/dL (ref 6.0–8.5)
eGFR: 88 mL/min/{1.73_m2} (ref >59)

## 2023-05-05 LAB — LIPID PANEL
Chol/HDL Ratio: 5.1 ratio — ABNORMAL HIGH (ref 0.0–5.0)
Cholesterol, Total: 180 mg/dL (ref 100–199)
HDL: 35 mg/dL — ABNORMAL LOW (ref >39)
LDL Chol Calc (NIH): 113 mg/dL — ABNORMAL HIGH (ref 0–99)
Triglycerides: 181 mg/dL — ABNORMAL HIGH (ref 0–149)
VLDL Cholesterol Cal: 32 mg/dL (ref 5–40)

## 2023-05-05 LAB — CBC
Hematocrit: 42.3 % (ref 37.5–51.0)
Hemoglobin: 13.9 g/dL (ref 13.0–17.7)
MCH: 28.8 pg (ref 26.6–33.0)
MCHC: 32.9 g/dL (ref 31.5–35.7)
MCV: 88 fL (ref 79–97)
Platelets: 188 10*3/uL (ref 150–450)
RBC: 4.82 x10E6/uL (ref 4.14–5.80)
RDW: 14.8 % (ref 11.6–15.4)
WBC: 5.6 10*3/uL (ref 3.4–10.8)

## 2023-05-05 LAB — TSH: TSH: 1.89 u[IU]/mL (ref 0.450–4.500)

## 2023-05-05 LAB — VITAMIN D 25 HYDROXY (VIT D DEFICIENCY, FRACTURES): Vit D, 25-Hydroxy: 39.4 ng/mL (ref 30.0–100.0)

## 2023-05-05 LAB — T4, FREE: Free T4: 1.5 ng/dL (ref 0.82–1.77)

## 2023-05-05 LAB — PSA TOTAL (REFLEX TO FREE): Prostate Specific Ag, Serum: 1.3 ng/mL (ref 0.0–4.0)

## 2023-05-21 DIAGNOSIS — M1711 Unilateral primary osteoarthritis, right knee: Secondary | ICD-10-CM | POA: Diagnosis not present

## 2023-05-21 DIAGNOSIS — M7021 Olecranon bursitis, right elbow: Secondary | ICD-10-CM | POA: Diagnosis not present

## 2023-06-10 DIAGNOSIS — M25561 Pain in right knee: Secondary | ICD-10-CM | POA: Diagnosis not present

## 2023-06-18 DIAGNOSIS — M25561 Pain in right knee: Secondary | ICD-10-CM | POA: Diagnosis not present

## 2023-06-24 DIAGNOSIS — M25561 Pain in right knee: Secondary | ICD-10-CM | POA: Diagnosis not present

## 2023-07-01 ENCOUNTER — Ambulatory Visit: Payer: Medicare Other | Admitting: Family Medicine

## 2023-07-01 DIAGNOSIS — M25561 Pain in right knee: Secondary | ICD-10-CM | POA: Diagnosis not present

## 2023-07-08 DIAGNOSIS — M25561 Pain in right knee: Secondary | ICD-10-CM | POA: Diagnosis not present

## 2023-07-15 DIAGNOSIS — M25561 Pain in right knee: Secondary | ICD-10-CM | POA: Diagnosis not present

## 2023-07-22 DIAGNOSIS — M25561 Pain in right knee: Secondary | ICD-10-CM | POA: Diagnosis not present

## 2023-07-28 DIAGNOSIS — Z23 Encounter for immunization: Secondary | ICD-10-CM | POA: Diagnosis not present

## 2023-07-29 DIAGNOSIS — M25561 Pain in right knee: Secondary | ICD-10-CM | POA: Diagnosis not present

## 2023-08-03 ENCOUNTER — Other Ambulatory Visit: Payer: Self-pay | Admitting: Family Medicine

## 2023-08-03 DIAGNOSIS — K649 Unspecified hemorrhoids: Secondary | ICD-10-CM

## 2023-08-13 ENCOUNTER — Encounter: Payer: Self-pay | Admitting: Family Medicine

## 2023-08-13 DIAGNOSIS — Z23 Encounter for immunization: Secondary | ICD-10-CM | POA: Diagnosis not present

## 2023-09-30 ENCOUNTER — Other Ambulatory Visit: Payer: Self-pay | Admitting: Family Medicine

## 2023-09-30 DIAGNOSIS — N401 Enlarged prostate with lower urinary tract symptoms: Secondary | ICD-10-CM

## 2023-09-30 NOTE — Telephone Encounter (Signed)
Rx 09/24/22 #90 4RF- too soon Requested Prescriptions  Pending Prescriptions Disp Refills   tamsulosin (FLOMAX) 0.4 MG CAPS capsule [Pharmacy Med Name: TAMSULOSIN HCL 0.4 MG CAP] 90 capsule 4    Sig: TAKE 1 CAPSULE BY MOUTH ONCE DAILY     Urology: Alpha-Adrenergic Blocker Failed - 09/30/2023  2:32 PM      Failed - Last BP in normal range    BP Readings from Last 1 Encounters:  05/04/23 (!) 167/98         Passed - PSA in normal range and within 360 days    Prostate Specific Ag, Serum  Date Value Ref Range Status  05/04/2023 1.3 0.0 - 4.0 ng/mL Final    Comment:    Roche ECLIA methodology. According to the American Urological Association, Serum PSA should decrease and remain at undetectable levels after radical prostatectomy. The AUA defines biochemical recurrence as an initial PSA value 0.2 ng/mL or greater followed by a subsequent confirmatory PSA value 0.2 ng/mL or greater. Values obtained with different assay methods or kits cannot be used interchangeably. Results cannot be interpreted as absolute evidence of the presence or absence of malignant disease.          Passed - Valid encounter within last 12 months    Recent Outpatient Visits           4 months ago Adult hypothyroidism   [place] [facility] Malva Limes, MD   1 year ago Adult hypothyroidism   [facility] Malva Limes, MD   1 year ago Adult hypothyroidism   [facility] [facility] Malva Limes, MD   1 year ago Essential (primary) hypertension   [place] [facility] Malva Limes, MD   2 years ago Pneumonia due to infectious organism, unspecified laterality, unspecified part of lung   [facility] Malva Limes, MD

## 2023-10-05 ENCOUNTER — Other Ambulatory Visit: Payer: Self-pay | Admitting: Family Medicine

## 2023-10-05 DIAGNOSIS — N401 Enlarged prostate with lower urinary tract symptoms: Secondary | ICD-10-CM

## 2023-10-14 DIAGNOSIS — M1711 Unilateral primary osteoarthritis, right knee: Secondary | ICD-10-CM | POA: Diagnosis not present

## 2023-10-31 ENCOUNTER — Other Ambulatory Visit: Payer: Self-pay | Admitting: Family Medicine

## 2023-10-31 DIAGNOSIS — E039 Hypothyroidism, unspecified: Secondary | ICD-10-CM

## 2023-11-19 DIAGNOSIS — L821 Other seborrheic keratosis: Secondary | ICD-10-CM | POA: Diagnosis not present

## 2023-11-19 DIAGNOSIS — D225 Melanocytic nevi of trunk: Secondary | ICD-10-CM | POA: Diagnosis not present

## 2023-11-19 DIAGNOSIS — D2262 Melanocytic nevi of left upper limb, including shoulder: Secondary | ICD-10-CM | POA: Diagnosis not present

## 2023-11-19 DIAGNOSIS — D485 Neoplasm of uncertain behavior of skin: Secondary | ICD-10-CM | POA: Diagnosis not present

## 2023-11-19 DIAGNOSIS — C44329 Squamous cell carcinoma of skin of other parts of face: Secondary | ICD-10-CM | POA: Diagnosis not present

## 2023-11-19 DIAGNOSIS — D2271 Melanocytic nevi of right lower limb, including hip: Secondary | ICD-10-CM | POA: Diagnosis not present

## 2023-11-19 DIAGNOSIS — L570 Actinic keratosis: Secondary | ICD-10-CM | POA: Diagnosis not present

## 2023-11-19 DIAGNOSIS — D2261 Melanocytic nevi of right upper limb, including shoulder: Secondary | ICD-10-CM | POA: Diagnosis not present

## 2023-11-19 DIAGNOSIS — D0462 Carcinoma in situ of skin of left upper limb, including shoulder: Secondary | ICD-10-CM | POA: Diagnosis not present

## 2023-11-19 DIAGNOSIS — Z85828 Personal history of other malignant neoplasm of skin: Secondary | ICD-10-CM | POA: Diagnosis not present

## 2023-11-19 DIAGNOSIS — Z85820 Personal history of malignant melanoma of skin: Secondary | ICD-10-CM | POA: Diagnosis not present

## 2023-11-19 DIAGNOSIS — Z872 Personal history of diseases of the skin and subcutaneous tissue: Secondary | ICD-10-CM | POA: Diagnosis not present

## 2023-11-19 DIAGNOSIS — D0472 Carcinoma in situ of skin of left lower limb, including hip: Secondary | ICD-10-CM | POA: Diagnosis not present

## 2023-11-19 DIAGNOSIS — D2272 Melanocytic nevi of left lower limb, including hip: Secondary | ICD-10-CM | POA: Diagnosis not present

## 2023-12-29 DIAGNOSIS — D0439 Carcinoma in situ of skin of other parts of face: Secondary | ICD-10-CM | POA: Diagnosis not present

## 2023-12-29 DIAGNOSIS — C44329 Squamous cell carcinoma of skin of other parts of face: Secondary | ICD-10-CM | POA: Diagnosis not present

## 2024-01-01 ENCOUNTER — Other Ambulatory Visit: Payer: Self-pay | Admitting: Family Medicine

## 2024-01-01 DIAGNOSIS — N401 Enlarged prostate with lower urinary tract symptoms: Secondary | ICD-10-CM

## 2024-01-05 DIAGNOSIS — D0462 Carcinoma in situ of skin of left upper limb, including shoulder: Secondary | ICD-10-CM | POA: Diagnosis not present

## 2024-01-05 DIAGNOSIS — D0472 Carcinoma in situ of skin of left lower limb, including hip: Secondary | ICD-10-CM | POA: Diagnosis not present

## 2024-01-30 ENCOUNTER — Other Ambulatory Visit: Payer: Self-pay | Admitting: Family Medicine

## 2024-01-30 DIAGNOSIS — I10 Essential (primary) hypertension: Secondary | ICD-10-CM

## 2024-02-22 DIAGNOSIS — M1711 Unilateral primary osteoarthritis, right knee: Secondary | ICD-10-CM | POA: Diagnosis not present

## 2024-04-27 ENCOUNTER — Other Ambulatory Visit: Payer: Self-pay | Admitting: Family Medicine

## 2024-04-27 DIAGNOSIS — K649 Unspecified hemorrhoids: Secondary | ICD-10-CM

## 2024-05-05 ENCOUNTER — Other Ambulatory Visit: Payer: Self-pay | Admitting: Family Medicine

## 2024-05-05 DIAGNOSIS — I10 Essential (primary) hypertension: Secondary | ICD-10-CM

## 2024-05-18 ENCOUNTER — Encounter: Payer: Self-pay | Admitting: Family Medicine

## 2024-05-18 ENCOUNTER — Ambulatory Visit (INDEPENDENT_AMBULATORY_CARE_PROVIDER_SITE_OTHER): Admitting: Family Medicine

## 2024-05-18 VITALS — BP 136/78 | HR 76 | Resp 16 | Wt 189.2 lb

## 2024-05-18 DIAGNOSIS — E785 Hyperlipidemia, unspecified: Secondary | ICD-10-CM

## 2024-05-18 DIAGNOSIS — Z125 Encounter for screening for malignant neoplasm of prostate: Secondary | ICD-10-CM | POA: Diagnosis not present

## 2024-05-18 DIAGNOSIS — E039 Hypothyroidism, unspecified: Secondary | ICD-10-CM

## 2024-05-18 DIAGNOSIS — E559 Vitamin D deficiency, unspecified: Secondary | ICD-10-CM | POA: Diagnosis not present

## 2024-05-18 DIAGNOSIS — M179 Osteoarthritis of knee, unspecified: Secondary | ICD-10-CM | POA: Insufficient documentation

## 2024-05-18 DIAGNOSIS — I10 Essential (primary) hypertension: Secondary | ICD-10-CM

## 2024-05-18 DIAGNOSIS — K219 Gastro-esophageal reflux disease without esophagitis: Secondary | ICD-10-CM | POA: Diagnosis not present

## 2024-05-18 DIAGNOSIS — F419 Anxiety disorder, unspecified: Secondary | ICD-10-CM

## 2024-05-18 DIAGNOSIS — K649 Unspecified hemorrhoids: Secondary | ICD-10-CM | POA: Diagnosis not present

## 2024-05-18 DIAGNOSIS — H6991 Unspecified Eustachian tube disorder, right ear: Secondary | ICD-10-CM

## 2024-05-18 DIAGNOSIS — Z860101 Personal history of adenomatous and serrated colon polyps: Secondary | ICD-10-CM

## 2024-05-18 MED ORDER — FLUTICASONE PROPIONATE 50 MCG/ACT NA SUSP: 2.0000 | Freq: Every day | NASAL | 12 refills | Status: AC

## 2024-05-18 MED ORDER — AMLODIPINE BESYLATE 10 MG PO TABS: 10.0000 mg | ORAL_TABLET | Freq: Every day | ORAL | 0 refills | Status: DC

## 2024-05-18 MED ORDER — PANTOPRAZOLE SODIUM 40 MG PO TBEC
40.0000 mg | DELAYED_RELEASE_TABLET | Freq: Every day | ORAL | 4 refills | Status: AC
Start: 2024-05-18 — End: ?

## 2024-05-18 MED ORDER — HYDROCORTISONE (PERIANAL) 2.5 % EX CREA: TOPICAL_CREAM | CUTANEOUS | 3 refills | Status: DC

## 2024-05-18 MED ORDER — ALPRAZOLAM 0.5 MG PO TABS: 0.2500 mg | ORAL_TABLET | Freq: Three times a day (TID) | ORAL | 1 refills | Status: AC | PRN

## 2024-05-18 NOTE — Progress Notes (Signed)
 Established patient visit   [name]   DOB: 21-Jun-1946   78 y.o. Male  MRN: [id number] Visit Date: 05/18/2024  Today's healthcare provider: Nancyann Perry, MD   Chief Complaint  Patient presents with   Medical Management of Chronic Issues   Subjective    Discussed the use of AI scribe software for clinical note transcription with the patient, who gave verbal consent to proceed.  Christian Mora is a 78 year old male who presents for follow-up of hypertension, hypothyroid, anxiety, GERD and BPH, reporting chronic knee pain.   He has ongoing issues with arthritis in his knee, managed with cortisone injections every four to six months over the past two years. The last injection did not provide the usual relief.  He has a history of skin cancer and is scheduled to see a dermatologist next Monday. He has a history of skin cancer and in past visits the dermatologist has found areas that required scraping or surgical removal.  He reports fluctuations in his blood pressure, noting that it has been low on some days, with readings as low as 110/66. On these days, he sometimes omits his blood pressure medication. He takes his blood pressure medication daily, except on days when his blood pressure is low. He is unsure about the status of his thyroid  condition but assumes it is stable.  He takes pantoprazole  for reflux and Flomax , both of which are working well. He also has a significant hiatal hernia, which occasionally flares up, requiring him to be cautious about his eating habits.  He uses alprazolam  as needed, typically taking a half tablet and rarely more than four times a month. He has received the RSV vaccine and is up to date with his COVID vaccinations. No chest pain, heart flutters, or shortness of breath.  Medications: Outpatient Medications Prior to Visit  Medication Sig   Bacillus Coagulans-Inulin (PROBIOTIC) 1-250 BILLION-MG CAPS Take 1 capsule by mouth daily at 6  (six) AM.   Cholecalciferol (VITAMIN D3 PO) Take 1,000 Units by mouth daily.   diclofenac  sodium (VOLTAREN ) 1 % GEL Apply 4 g topically 4 (four) times daily as needed. (Patient taking differently: Apply 4 g topically 4 (four) times daily as needed (pain).)   levothyroxine  (SYNTHROID ) 112 MCG tablet TAKE ONE TABLET BY MOUTH EVERY DAY   Multiple Vitamins-Iron (MULTIVITAMIN/IRON PO) Take 1 tablet by mouth daily at 6 (six) AM.   tamsulosin  (FLOMAX ) 0.4 MG CAPS capsule TAKE 1 CAPSULE BY MOUTH ONCE DAILY   ALPRAZolam  (XANAX ) 0.5 MG tablet Take 0.5-1 tablets (0.25-0.5 mg total) by mouth 3 (three) times daily as needed for anxiety.   amLODipine  (NORVASC ) 10 MG tablet TAKE ONE TABLET BY MOUTH ONCE DAILY   fluticasone  (FLONASE ) 50 MCG/ACT nasal spray Place 2 sprays into both nostrils daily.   hydrocortisone  (ANUSOL -HC) 2.5 % rectal cream APPLY TO AFFECTED AREAS 2 TO 3 TIMES DAILY AS NEEDED (Patient taking differently: as needed. APPLY TO AFFECTED AREAS 2 TO 3 TIMES DAILY AS NEEDED)   pantoprazole  (PROTONIX ) 40 MG tablet Take 1 tablet (40 mg total) by mouth daily.   augmented betamethasone dipropionate (DIPROLENE-AF) 0.05 % cream    No facility-administered medications prior to visit.   Review of Systems  Constitutional:  Negative for appetite change, chills and fever.  Respiratory:  Negative for chest tightness, shortness of breath and wheezing.   Cardiovascular:  Negative for chest pain and palpitations.  Gastrointestinal:  Negative for abdominal pain, nausea and  vomiting.       Objective    BP 136/78 (BP Location: Left Arm, Patient Position: Sitting, Cuff Size: Normal) Comment: at home this morning/white coat syndrome  Pulse 76   Resp 16   Wt 189 lb 3.2 oz (85.8 kg)   SpO2 98%   BMI 31.48 kg/m   Physical Exam   General: Appearance:    Mildly obese male in no acute distress  Eyes:    PERRL, conjunctiva/corneas clear, EOM's intact       Lungs:     Clear to auscultation bilaterally,  respirations unlabored  Heart:    Normal heart rate. Normal rhythm. No murmurs, rubs, or gallops.    MS:   All extremities are intact.    Neurologic:   Awake, alert, oriented x 3. No apparent focal neurological defect.        Assessment & Plan    1. Primary hypertension (Primary) Well controlled.  Continue current medications.   - amLODipine  (NORVASC ) 10 MG tablet; Take 1 tablet (10 mg total) by mouth daily.  Dispense: 90 tablet; Refill: 0  2. Anxiety Does well with occasional ALPRAZolam  (XANAX ) 0.5 MG tablet; Take 0.5-1 tablets (0.25-0.5 mg total) by mouth 3 (three) times daily as needed for anxiety.  Dispense: 90 tablet; Refill: 1  3. Hyperlipidemia, unspecified hyperlipidemia type Diet controlled.  - Lipid panel - Comprehensive metabolic panel with GFR - CBC with Differential/Platelet  4. Adult hypothyroidism Feels well on current thyroid  replacement.  - T4 AND TSH  5. Vitamin D  deficiency  - VITAMIN D  25 Hydroxy (Vit-D Deficiency, Fractures)  6. Gastroesophageal reflux disease without esophagitis Doing well on current PPI.  - pantoprazole  (PROTONIX ) 40 MG tablet; Take 1 tablet (40 mg total) by mouth daily.  Dispense: 90 tablet; Refill: 4  7. Hemorrhoids, unspecified hemorrhoid type Refill  hydrocortisone  (ANUSOL -HC) 2.5 % rectal cream; APPLY TO AFFECTED AREAS 2 TO 3 TIMES DAILY AS NEEDED  Dispense: 28 g; Refill: 3  8. Eustachian tube dysfunction, right Refill - fluticasone  (FLONASE ) 50 MCG/ACT nasal spray; Place 2 sprays into both nostrils daily.  Dispense: 16 g; Refill: 12  9. Prostate cancer screening  - PSA Total (Reflex To Free)  10. OA Knee  Continue regular follow up orthopedist.        Nancyann Perry, MD  [facility] [phone] (phone) [phone] (fax)  [facility]

## 2024-05-19 ENCOUNTER — Ambulatory Visit: Payer: Self-pay | Admitting: Family Medicine

## 2024-05-19 DIAGNOSIS — H2511 Age-related nuclear cataract, right eye: Secondary | ICD-10-CM | POA: Diagnosis not present

## 2024-05-19 DIAGNOSIS — M3501 Sicca syndrome with keratoconjunctivitis: Secondary | ICD-10-CM | POA: Diagnosis not present

## 2024-05-19 DIAGNOSIS — H35373 Puckering of macula, bilateral: Secondary | ICD-10-CM | POA: Diagnosis not present

## 2024-05-19 DIAGNOSIS — H43813 Vitreous degeneration, bilateral: Secondary | ICD-10-CM | POA: Diagnosis not present

## 2024-05-19 LAB — LIPID PANEL
Chol/HDL Ratio: 5.5 ratio — ABNORMAL HIGH (ref 0.0–5.0)
Cholesterol, Total: 170 mg/dL (ref 100–199)
HDL: 31 mg/dL — ABNORMAL LOW (ref >39)
LDL Chol Calc (NIH): 107 mg/dL — ABNORMAL HIGH (ref 0–99)
Triglycerides: 183 mg/dL — ABNORMAL HIGH (ref 0–149)
VLDL Cholesterol Cal: 32 mg/dL (ref 5–40)

## 2024-05-19 LAB — CBC WITH DIFFERENTIAL/PLATELET
Basophils Absolute: 0.1 x10E3/uL (ref 0.0–0.2)
Basos: 1 %
EOS (ABSOLUTE): 0.1 x10E3/uL (ref 0.0–0.4)
Eos: 3 %
Hematocrit: 42.2 % (ref 37.5–51.0)
Hemoglobin: 13.7 g/dL (ref 13.0–17.7)
Immature Grans (Abs): 0 x10E3/uL (ref 0.0–0.1)
Immature Granulocytes: 0 %
Lymphocytes Absolute: 1.6 x10E3/uL (ref 0.7–3.1)
Lymphs: 31 %
MCH: 30 pg (ref 26.6–33.0)
MCHC: 32.5 g/dL (ref 31.5–35.7)
MCV: 93 fL (ref 79–97)
Monocytes Absolute: 0.4 x10E3/uL (ref 0.1–0.9)
Monocytes: 8 %
Neutrophils Absolute: 3 x10E3/uL (ref 1.4–7.0)
Neutrophils: 56 %
Platelets: 170 x10E3/uL (ref 150–450)
RBC: 4.56 x10E6/uL (ref 4.14–5.80)
RDW: 14.1 % (ref 11.6–15.4)
WBC: 5.2 x10E3/uL (ref 3.4–10.8)

## 2024-05-19 LAB — PSA TOTAL (REFLEX TO FREE): Prostate Specific Ag, Serum: 1.1 ng/mL (ref 0.0–4.0)

## 2024-05-19 LAB — COMPREHENSIVE METABOLIC PANEL WITH GFR
ALT: 18 IU/L (ref 0–44)
AST: 21 IU/L (ref 0–40)
Albumin: 4.2 g/dL (ref 3.8–4.8)
Alkaline Phosphatase: 69 IU/L (ref 44–121)
BUN/Creatinine Ratio: 9 — ABNORMAL LOW (ref 10–24)
BUN: 9 mg/dL (ref 8–27)
Bilirubin Total: 0.7 mg/dL (ref 0.0–1.2)
CO2: 21 mmol/L (ref 20–29)
Calcium: 8.9 mg/dL (ref 8.6–10.2)
Chloride: 106 mmol/L (ref 96–106)
Creatinine, Ser: 0.97 mg/dL (ref 0.76–1.27)
Globulin, Total: 3.2 g/dL (ref 1.5–4.5)
Glucose: 107 mg/dL — ABNORMAL HIGH (ref 70–99)
Potassium: 3.9 mmol/L (ref 3.5–5.2)
Sodium: 141 mmol/L (ref 134–144)
Total Protein: 7.4 g/dL (ref 6.0–8.5)
eGFR: 80 mL/min/1.73 (ref >59)

## 2024-05-19 LAB — T4 AND TSH
T4, Total: 9 ug/dL (ref 4.5–12.0)
TSH: 1.19 u[IU]/mL (ref 0.450–4.500)

## 2024-05-19 LAB — VITAMIN D 25 HYDROXY (VIT D DEFICIENCY, FRACTURES): Vit D, 25-Hydroxy: 43.7 ng/mL (ref 30.0–100.0)

## 2024-05-23 DIAGNOSIS — D2272 Melanocytic nevi of left lower limb, including hip: Secondary | ICD-10-CM | POA: Diagnosis not present

## 2024-05-23 DIAGNOSIS — Z85820 Personal history of malignant melanoma of skin: Secondary | ICD-10-CM | POA: Diagnosis not present

## 2024-05-23 DIAGNOSIS — L570 Actinic keratosis: Secondary | ICD-10-CM | POA: Diagnosis not present

## 2024-05-23 DIAGNOSIS — D225 Melanocytic nevi of trunk: Secondary | ICD-10-CM | POA: Diagnosis not present

## 2024-05-23 DIAGNOSIS — D2262 Melanocytic nevi of left upper limb, including shoulder: Secondary | ICD-10-CM | POA: Diagnosis not present

## 2024-05-23 DIAGNOSIS — D485 Neoplasm of uncertain behavior of skin: Secondary | ICD-10-CM | POA: Diagnosis not present

## 2024-05-23 DIAGNOSIS — D0471 Carcinoma in situ of skin of right lower limb, including hip: Secondary | ICD-10-CM | POA: Diagnosis not present

## 2024-05-23 DIAGNOSIS — D2261 Melanocytic nevi of right upper limb, including shoulder: Secondary | ICD-10-CM | POA: Diagnosis not present

## 2024-05-23 DIAGNOSIS — Z85828 Personal history of other malignant neoplasm of skin: Secondary | ICD-10-CM | POA: Diagnosis not present

## 2024-05-25 DIAGNOSIS — M1711 Unilateral primary osteoarthritis, right knee: Secondary | ICD-10-CM | POA: Diagnosis not present

## 2024-06-02 DIAGNOSIS — M1711 Unilateral primary osteoarthritis, right knee: Secondary | ICD-10-CM | POA: Diagnosis not present

## 2024-06-08 DIAGNOSIS — M1711 Unilateral primary osteoarthritis, right knee: Secondary | ICD-10-CM | POA: Diagnosis not present

## 2024-06-21 DIAGNOSIS — D0471 Carcinoma in situ of skin of right lower limb, including hip: Secondary | ICD-10-CM | POA: Diagnosis not present

## 2024-08-02 DIAGNOSIS — Z23 Encounter for immunization: Secondary | ICD-10-CM | POA: Diagnosis not present

## 2024-09-07 ENCOUNTER — Encounter

## 2024-09-07 ENCOUNTER — Ambulatory Visit

## 2024-09-22 DIAGNOSIS — M1711 Unilateral primary osteoarthritis, right knee: Secondary | ICD-10-CM | POA: Diagnosis not present

## 2024-10-07 ENCOUNTER — Ambulatory Visit
Admission: RE | Admit: 2024-10-07 | Discharge: 2024-10-07 | Disposition: A | Discharge status: Home / Self Care | Source: Ambulatory Visit | Attending: Emergency Medicine | Admitting: Emergency Medicine

## 2024-10-07 VITALS — BP 129/68 | HR 99 | Temp 97.8°F | Resp 20

## 2024-10-07 DIAGNOSIS — J209 Acute bronchitis, unspecified: Secondary | ICD-10-CM

## 2024-10-07 MED ORDER — PROMETHAZINE-DM 6.25-15 MG/5ML PO SYRP: 5.0000 mL | ORAL_SOLUTION | Freq: Four times a day (QID) | ORAL | 0 refills | Status: DC | PRN

## 2024-10-07 MED ORDER — AMOXICILLIN-POT CLAVULANATE 875-125 MG PO TABS: 1.0000 | ORAL_TABLET | Freq: Two times a day (BID) | ORAL | 0 refills | Status: DC

## 2024-10-07 NOTE — Discharge Instructions (Signed)
 Today you are being treated for bronchitis, on exam able to hear wheezing which you are getting enough air without assistance  Begin Augmentin  twice daily for 7 days to treat any bacteria contributing to symptoms  You may use cough syrup every 6 hours as needed for additional comfort but please be mindful this can make you feel drowsy    You can take Tylenol  and/or Ibuprofen as needed for fever reduction and pain relief.   For cough: honey 1/2 to 1 teaspoon (you can dilute the honey in water or another fluid).  You can also use guaifenesin and dextromethorphan for cough. You can use a humidifier for chest congestion and cough.  If you don't have a humidifier, you can sit in the bathroom with the hot shower running.      For sore throat: try warm salt water gargles, cepacol lozenges, throat spray, warm tea or water with lemon/honey, popsicles or ice, or OTC cold relief medicine for throat discomfort.   For congestion: take a daily anti-histamine like Zyrtec, Claritin, and a oral decongestant, such as pseudoephedrine.  You can also use Flonase  1-2 sprays in each nostril daily.   It is important to stay hydrated: drink plenty of fluids (water, gatorade/powerade/pedialyte, juices, or teas) to keep your throat moisturized and help further relieve irritation/discomfort.

## 2024-10-07 NOTE — ED Triage Notes (Signed)
 Patient reports Sunday he felt like he was having sinus issues Patient states My nose was runny nose and I took Zicam. Patient reports Tuesday sinus drainage  and dull aches and low grade fever. Patient reports a cough that started Wednesday. Patient took mucinex and OTC cough syrup on Thursday for symptoms. Patient now complains of chest congestion that started today. Denies pain at this time.

## 2024-10-07 NOTE — ED Provider Notes (Signed)
 [name]    CSN: [id number] Arrival date & time: 10/07/24  1413      History   Chief Complaint Chief Complaint  Patient presents with   Wheezing    symptoms of a cold with nasal congestion and chest tightness with small wheeze.  low grade fever sometimes.  currently using tylenol  for dull body ache and fever, over the counter cough syrup,    limited amount of mucinex for congestion. - Entered by patient   Cough   Nasal Congestion   Fever    HPI Christian Mora is a 78 y.o. male.   Patient presents for evaluation of low-grade fevers, generalized bodyaches, nasal congestion, sinus drainage and productive cough with clear sputum present for 5 days.  Associated wheezing occurring intermittently, has improved today.  Associated chest tightness occurring intermittently has improved today.  Experiencing right sided chest discomfort, relating to persistent cough.  Endorses chest congestion and head fullness worsening and more prominent today.  Denies ear pain, sore throat, sinus pain or pressure, denying shortness of breath.  Tolerable to food and liquids.  No known sick contacts prior.  Has attempted use of Zicam, Mucinex and over-the-counter cough syrup with minimal relief.  Has prior respiratory history, non-smoker.  Past Medical History:  Diagnosis Date   Allergy    Anemia    Anxiety    Diverticulitis    GERD (gastroesophageal reflux disease)    Hyperlipidemia    Hypertension    Hypothyroidism    Iron deficiency anemia 01/26/2018   Lab Results  Component  Value  Date     FERRITIN  5 (L)  01/22/2018         Lab Results  Component  Value  Date     WBC  4.2  01/22/2018     HGB  9.1 (L)  01/22/2018     HCT  31.7 (L)  01/22/2018     MCV  73 (L)  01/22/2018     PLT  164  01/22/2018         Melanoma of skin (HCC) 03/13/2003   SBO (small bowel obstruction) (HCC) 10/16/2020   Thyroid  disease     Patient Active Problem List   Diagnosis Date Noted   Osteoarthritis of knee  05/18/2024   Vitamin D  deficiency 03/28/2022   Abnormal CT scan 03/12/2021   Hiatal hernia 12/03/2020   Umbilical hernia without obstruction and without gangrene 12/03/2020   History of adenomatous polyp of colon 03/09/2018   Urinary hesitancy 02/25/2017   Fatty infiltration of liver 07/30/2015   Hepatic cyst 07/30/2015   Primary hypertension 02/18/2010   Allergic rhinitis 02/22/2009   BPH (benign prostatic hyperplasia) 10/06/2007   HLD (hyperlipidemia) 02/23/2007   Anxiety 12/15/2006   Acid reflux 11/04/2003   Adult hypothyroidism 07/03/1989    Past Surgical History:  Procedure Laterality Date   COLONOSCOPY WITH PROPOFOL  N/A 03/03/2018   Procedure: COLONOSCOPY WITH PROPOFOL ;  Surgeon: Therisa Bi, MD;  Location: [facility] ENDOSCOPY;  Service: Gastroenterology;  Laterality: N/A;   ESOPHAGOGASTRODUODENOSCOPY (EGD) WITH PROPOFOL  N/A 03/03/2018   Procedure: ESOPHAGOGASTRODUODENOSCOPY (EGD) WITH PROPOFOL ;  Surgeon: Therisa Bi, MD;  Location: [facility] ENDOSCOPY;  Service: Gastroenterology;  Laterality: N/A;   GIVENS CAPSULE STUDY N/A 03/12/2018   Procedure: GIVENS CAPSULE STUDY;  Surgeon: Therisa Bi, MD;  Location: [facility] ENDOSCOPY;  Service: Gastroenterology;  Laterality: N/A;   lymph node removed  11/03/2002   TONSILLECTOMY AND ADENOIDECTOMY  11/03/1952       Home Medications  Prior to Admission medications   Medication Sig Start Date End Date Taking? Authorizing Provider  ALPRAZolam  (XANAX ) 0.5 MG tablet Take 0.5-1 tablets (0.25-0.5 mg total) by mouth 3 (three) times daily as needed for anxiety. 05/18/24   Gasper Nancyann BRAVO, MD  amLODipine  (NORVASC ) 10 MG tablet Take 1 tablet (10 mg total) by mouth daily. 05/18/24   Gasper Nancyann BRAVO, MD  augmented betamethasone dipropionate (DIPROLENE-AF) 0.05 % cream     [provider]  Bacillus Coagulans-Inulin (PROBIOTIC) 1-250 BILLION-MG CAPS Take 1 capsule by mouth daily at 6 (six) AM. 10/24/20   [provider]  Cholecalciferol  (VITAMIN D3 PO) Take 1,000 Units by mouth daily.    [provider]  diclofenac  sodium (VOLTAREN ) 1 % GEL Apply 4 g topically 4 (four) times daily as needed. Patient taking differently: Apply 4 g topically 4 (four) times daily as needed (pain). 11/08/18   Gasper Nancyann BRAVO, MD  fluticasone  (FLONASE ) 50 MCG/ACT nasal spray Place 2 sprays into both nostrils daily. 05/18/24   Gasper Nancyann BRAVO, MD  hydrocortisone  (ANUSOL -HC) 2.5 % rectal cream APPLY TO AFFECTED AREAS 2 TO 3 TIMES DAILY AS NEEDED 05/18/24   Gasper Nancyann BRAVO, MD  levothyroxine  (SYNTHROID ) 112 MCG tablet TAKE ONE TABLET BY MOUTH EVERY DAY 11/02/23   Gasper Nancyann BRAVO, MD  Multiple Vitamins-Iron (MULTIVITAMIN/IRON PO) Take 1 tablet by mouth daily at 6 (six) AM.    [provider]  pantoprazole  (PROTONIX ) 40 MG tablet Take 1 tablet (40 mg total) by mouth daily. 05/18/24   Gasper Nancyann BRAVO, MD  tamsulosin  (FLOMAX ) 0.4 MG CAPS capsule TAKE 1 CAPSULE BY MOUTH ONCE DAILY 01/02/24   Gasper Nancyann BRAVO, MD    Family History Family History  Problem Relation Age of Onset   Hypertension Mother    Cancer Father        prostate   Diabetes Son     Social History Social History   Tobacco Use   Smoking status: Never   Smokeless tobacco: Never  Vaping Use   Vaping status: Never Used  Substance Use Topics   Alcohol  use: No   Drug use: No     Allergies   Patient has no known allergies.   Review of Systems Review of Systems  Constitutional:  Positive for fever. Negative for activity change, appetite change, chills, diaphoresis, fatigue and unexpected weight change.  HENT:  Positive for congestion. Negative for dental problem, drooling, ear discharge, ear pain, facial swelling, hearing loss, mouth sores, nosebleeds, postnasal drip, rhinorrhea, sinus pressure, sinus pain, sneezing, sore throat, tinnitus, trouble swallowing and voice change.   Respiratory:  Positive for cough and wheezing. Negative for apnea, choking, chest  tightness, shortness of breath and stridor.   Neurological: Negative.      Physical Exam Triage Vital Signs ED Triage Vitals  Encounter Vitals Group     BP 10/07/24 1437 129/68     Girls Systolic BP Percentile --      Girls Diastolic BP Percentile --      Boys Systolic BP Percentile --      Boys Diastolic BP Percentile --      Pulse Rate 10/07/24 1437 99     Resp 10/07/24 1437 20     Temp 10/07/24 1437 97.8 F (36.6 C)     Temp Source 10/07/24 1437 Oral     SpO2 10/07/24 1437 97 %     Weight --      Height --  Head Circumference --      Peak Flow --      Pain Score 10/07/24 1435 0     Pain Loc --      Pain Education --      Exclude from Growth Chart --    No data found.  Updated Vital Signs BP 129/68 (BP Location: Left Arm)   Pulse 99   Temp 97.8 F (36.6 C) (Oral)   Resp 20   SpO2 97%   Visual Acuity Right Eye Distance:   Left Eye Distance:   Bilateral Distance:    Right Eye Near:   Left Eye Near:    Bilateral Near:     Physical Exam Constitutional:      Appearance: Normal appearance.  HENT:     Head: Normocephalic.     Right Ear: Tympanic membrane, ear canal and external ear normal.     Left Ear: Tympanic membrane, ear canal and external ear normal.     Nose: Congestion present.     Mouth/Throat:     Pharynx: No oropharyngeal exudate or posterior oropharyngeal erythema.  Eyes:     Extraocular Movements: Extraocular movements intact.  Cardiovascular:     Rate and Rhythm: Normal rate and regular rhythm.     Pulses: Normal pulses.     Heart sounds: Normal heart sounds.  Pulmonary:     Effort: Pulmonary effort is normal.     Comments: Wheezing present to the right upper lobe, remaining lobes clear Neurological:     Mental Status: He is alert and oriented to person, place, and time.      UC Treatments / Results  Labs (all labs ordered are listed, but only abnormal results are displayed) Labs Reviewed - No data to  display  EKG   Radiology No results found.  Procedures Procedures (including critical care time)  Medications Ordered in UC Medications - No data to display  Initial Impression / Assessment and Plan / UC Course  I have reviewed the triage vital signs and the nursing notes.  Pertinent labs & imaging results that were available during my care of the patient were reviewed by me and considered in my medical decision making (see chart for details).  Acute bronchitis  Vital signs stable, patient in no signs of distress nontoxic-appearing, wheezing heard to auscultation, O2 saturation 97% on room air, stable for outpatient management, prescribed Augmentin , declined use of prednisone  or inhaler additionally prescribed Promethazine  DM for management of cough, recommended over-the-counter medications and nonpharmacological supportive care advised to monitor symptoms closely and for any persisting symptoms to follow-up for reevaluation, patient endorses PCP appointment on Tuesday Final Clinical Impressions(s) / UC Diagnoses   Final diagnoses:  Viral illness   Discharge Instructions   None    ED Prescriptions   None    PDMP not reviewed this encounter.   Teresa Shelba SAUNDERS, NP 10/07/24 1459

## 2024-10-11 ENCOUNTER — Ambulatory Visit

## 2024-10-12 ENCOUNTER — Ambulatory Visit (INDEPENDENT_AMBULATORY_CARE_PROVIDER_SITE_OTHER): Admitting: Family Medicine

## 2024-10-12 ENCOUNTER — Ambulatory Visit: Payer: Self-pay

## 2024-10-12 ENCOUNTER — Encounter: Payer: Self-pay | Admitting: Family Medicine

## 2024-10-12 VITALS — BP 132/74 | HR 115 | Temp 98.0°F | Resp 18 | Ht 65.0 in | Wt 188.9 lb

## 2024-10-12 DIAGNOSIS — R0602 Shortness of breath: Secondary | ICD-10-CM | POA: Diagnosis not present

## 2024-10-12 DIAGNOSIS — J40 Bronchitis, not specified as acute or chronic: Secondary | ICD-10-CM

## 2024-10-12 DIAGNOSIS — R600 Localized edema: Secondary | ICD-10-CM | POA: Diagnosis not present

## 2024-10-12 DIAGNOSIS — R000 Tachycardia, unspecified: Secondary | ICD-10-CM

## 2024-10-12 DIAGNOSIS — R0601 Orthopnea: Secondary | ICD-10-CM | POA: Diagnosis not present

## 2024-10-12 MED ORDER — PREDNISONE 10 MG PO TABS: 10.0000 mg | ORAL_TABLET | Freq: Two times a day (BID) | ORAL | 0 refills | Status: DC

## 2024-10-12 MED ORDER — DOXYCYCLINE HYCLATE 100 MG PO TABS: 100.0000 mg | ORAL_TABLET | Freq: Two times a day (BID) | ORAL | 0 refills | Status: DC

## 2024-10-12 MED ORDER — BENZONATATE 100 MG PO CAPS: 100.0000 mg | ORAL_CAPSULE | Freq: Three times a day (TID) | ORAL | 0 refills | Status: DC | PRN

## 2024-10-12 NOTE — Telephone Encounter (Signed)
 FYI Only or Action Required?: FYI only for provider: appointment scheduled on 10/12/2024.  Patient was last seen in primary care on 05/18/2024 by Gasper Nancyann BRAVO, MD.  Called Nurse Triage reporting Cough.  Symptoms began a week ago.  Interventions attempted: Nothing.  Symptoms are: gradually worsening.  Triage Disposition: See Physician Within 24 Hours  Patient/caregiver understands and will follow disposition?: Yes   Copied from CRM #[id number]. Topic: Clinical - Red Word Triage >> Oct 12, 2024  7:51 AM Rosaria BRAVO wrote: Red Word that prompted transfer to Nurse Triage: Worsening bronchitis, cannot lay back at all, severe cough.   Pt's wife Daymon Hora is calling. Reason for Disposition  SEVERE coughing spells (e.g., whooping sound after coughing, vomiting after coughing)  Answer Assessment - Initial Assessment Questions 1. ONSET: When did the cough begin?      X week 2. SEVERITY: How bad is the cough today?      severe 3. SPUTUM: Describe the color of your sputum (e.g., none, dry cough; clear, white, yellow, green)     clear 4. HEMOPTYSIS: Are you coughing up any blood? If Yes, ask: How much? (e.g., flecks, streaks, tablespoons, etc.)     no 5. DIFFICULTY BREATHING: Are you having difficulty breathing? If Yes, ask: How bad is it? (e.g., mild, moderate, severe)      no 6. FEVER: Do you have a fever? If Yes, ask: What is your temperature, how was it measured, and when did it start?     Low grade 7. CARDIAC HISTORY: Do you have any history of heart disease? (e.g., heart attack, congestive heart failure)      na 8. LUNG HISTORY: Do you have any history of lung disease?  (e.g., pulmonary embolus, asthma, emphysema)     na 9. PE RISK FACTORS: Do you have a history of blood clots? (or: recent major surgery, recent prolonged travel, bedridden)     na 10. OTHER SYMPTOMS: Do you have any other symptoms? (e.g., runny nose, wheezing, chest pain)        none 11. PREGNANCY: Is there any chance you are pregnant? When was your last menstrual period?       na 12. TRAVEL: Have you traveled out of the country in the last month? (e.g., travel history, exposures)       na  Last Friday went to Urgent care and dx with bronchitis: wife states pt is worsening - cough is so bad at present time that patient is not able to lay down due to coughing spells.  Cough is mostly nonproductive and when productive only clear sputum  Protocols used: Cough - Acute Productive-A-AH

## 2024-10-12 NOTE — Progress Notes (Signed)
 Name: Christian Mora   MRN: [id number]    DOB: 1946-06-07   Date:10/12/2024       Progress Note  Subjective  Chief Complaint  Chief Complaint  Patient presents with   Cough    X1 week, Worsening bronchitis, cannot lay back at all, severe cough    Discussed the use of AI scribe software for clinical note transcription with the patient, who gave verbal consent to proceed.  History of Present Illness BRIX BREARLEY is a 78 year old male who presents with persistent subjective fever, cough, and chest congestion.  Symptoms began on December 2nd with clear sinus drainage lasting for about three days, followed by upper chest tightness and congestion. He was diagnosed with bronchitis at a walk-in clinic and prescribed Augmentin  875 mg and promethazine  with dextromethorphan cough syrup.  The day after starting treatment, he developed a fever and cough, which have persisted. The fever is intermittent, he states high of 98.59F - higher than his baseline temperature, and he has difficulty sleeping due to coughing and congestion, unable to lie down for about four days as it worsens his symptoms. Last night was particularly difficult, with increased fever and congestion.  The cough produces clear sputum, with no discoloration in sinus drainage or sputum. Despite being on antibiotics for five days, there is no improvement, and fever continues. He has a history of bronchitis but is surprised by the clear drainage and sputum in this instance.  He reports a high heart rate, with a pulse of 115 today, compared to his usual range of 60-85. His oxygen  saturation is 92%, down from 97% at the urgent care visit on December 5th. He has noticed some swelling in his ankles today, which he has not experienced before. The patient is not aware of any lung disease or history of congestive heart failure. He hs noticed some SOB and orthopnea  He is eating and drinking fluids adequately, though he reports loose stools,  likely due to the antibiotic.    Patient Active Problem List   Diagnosis Date Noted   Osteoarthritis of knee 05/18/2024   Vitamin D  deficiency 03/28/2022   Abnormal CT scan 03/12/2021   Hiatal hernia 12/03/2020   Umbilical hernia without obstruction and without gangrene 12/03/2020   History of adenomatous polyp of colon 03/09/2018   Urinary hesitancy 02/25/2017   Fatty infiltration of liver 07/30/2015   Hepatic cyst 07/30/2015   Primary hypertension 02/18/2010   Allergic rhinitis 02/22/2009   BPH (benign prostatic hyperplasia) 10/06/2007   HLD (hyperlipidemia) 02/23/2007   Anxiety 12/15/2006   Acid reflux 11/04/2003   Adult hypothyroidism 07/03/1989    Social History   Tobacco Use   Smoking status: Never   Smokeless tobacco: Never  Substance Use Topics   Alcohol  use: No     Current Outpatient Medications:    ALPRAZolam  (XANAX ) 0.5 MG tablet, Take 0.5-1 tablets (0.25-0.5 mg total) by mouth 3 (three) times daily as needed for anxiety., Disp: 90 tablet, Rfl: 1   amLODipine  (NORVASC ) 10 MG tablet, Take 1 tablet (10 mg total) by mouth daily., Disp: 90 tablet, Rfl: 0   amoxicillin -clavulanate (AUGMENTIN ) 875-125 MG tablet, Take 1 tablet by mouth every 12 (twelve) hours., Disp: 14 tablet, Rfl: 0   Bacillus Coagulans-Inulin (PROBIOTIC) 1-250 BILLION-MG CAPS, Take 1 capsule by mouth daily at 6 (six) AM., Disp: , Rfl:    Cholecalciferol (VITAMIN D3 PO), Take 1,000 Units by mouth daily., Disp: , Rfl:    diclofenac  sodium (VOLTAREN ) 1 %  GEL, Apply 4 g topically 4 (four) times daily as needed. (Patient taking differently: Apply 4 g topically 4 (four) times daily as needed (pain).), Disp: 100 g, Rfl: 1   fluticasone  (FLONASE ) 50 MCG/ACT nasal spray, Place 2 sprays into both nostrils daily., Disp: 16 g, Rfl: 12   hydrocortisone  (ANUSOL -HC) 2.5 % rectal cream, APPLY TO AFFECTED AREAS 2 TO 3 TIMES DAILY AS NEEDED, Disp: 28 g, Rfl: 3   levothyroxine  (SYNTHROID ) 112 MCG tablet, TAKE ONE TABLET  BY MOUTH EVERY DAY, Disp: 90 tablet, Rfl: 4   Multiple Vitamins-Iron (MULTIVITAMIN/IRON PO), Take 1 tablet by mouth daily at 6 (six) AM., Disp: , Rfl:    pantoprazole  (PROTONIX ) 40 MG tablet, Take 1 tablet (40 mg total) by mouth daily., Disp: 90 tablet, Rfl: 4   promethazine -dextromethorphan (PROMETHAZINE -DM) 6.25-15 MG/5ML syrup, Take 5 mLs by mouth 4 (four) times daily as needed., Disp: 118 mL, Rfl: 0   tamsulosin  (FLOMAX ) 0.4 MG CAPS capsule, TAKE 1 CAPSULE BY MOUTH ONCE DAILY, Disp: 90 capsule, Rfl: 4   augmented betamethasone dipropionate (DIPROLENE-AF) 0.05 % cream, , Disp: , Rfl:   No Known Allergies  ROS  Ten systems reviewed and is negative except as mentioned in HPI    Objective  Vitals:   10/12/24 1507  BP: 132/74  Pulse: (!) 115  Resp: 18  Temp: 98 F (36.7 C)  TempSrc: Oral  SpO2: 92%  Weight: 188 lb 14.4 oz (85.7 kg)  Height: 5' 5 (1.651 m)    Body mass index is 31.43 kg/m.   Physical Exam VITALS: T- 98.3, P- 116, SaO2- 92% CONSTITUTIONAL: Patient appears well-developed and well-nourished. No distress. HEENT: Head atraumatic, normocephalic, neck supple. CARDIOVASCULAR: Normal rate, regular rhythm and normal heart sounds. No murmur heard. Ankle edema present. PULMONARY: Effort normal and breath sounds normal. No respiratory distress. Rhonchi present bilaterally . ABDOMINAL: There is no tenderness or distention. MUSCULOSKELETAL: Normal gait. Without gross motor or sensory deficit. PSYCHIATRIC: Patient has a normal mood and affect. Behavior is normal. Judgment and thought content normal.    Assessment & Plan Bronchitis Persistent symptoms unresponsive to initial treatment. Differential includes CHF, but lung sounds suggest bronchitis. Loose stools likely due to Augmentin . - Discontinued Augmentin . - Prescribed doxycycline. - Prescribed prednisone . - Ordered CBC and basic metabolic panel. - Ordered BNP. - Advised follow-up with Doctor Gasper by  Friday.  Tachycardia Heart rate elevated at 115 bpm. Oxygen  saturation at 92%. - Monitor heart rate and symptoms. - Ordered BNP.  Bilateral lower extremity edema New onset edema. Differential includes CHF, no prior history. - Ordered BNP.

## 2024-10-13 ENCOUNTER — Ambulatory Visit: Payer: Self-pay | Admitting: Family Medicine

## 2024-10-13 LAB — CBC WITH DIFFERENTIAL/PLATELET
Absolute Lymphocytes: 2000 {cells}/uL (ref 850–3900)
Absolute Monocytes: 523 {cells}/uL (ref 200–950)
Basophils Absolute: 42 {cells}/uL (ref 0–200)
Basophils Relative: 0.5 %
Eosinophils Absolute: 108 {cells}/uL (ref 15–500)
Eosinophils Relative: 1.3 %
HCT: 40.5 % (ref 39.4–51.1)
Hemoglobin: 13.2 g/dL (ref 13.2–17.1)
MCH: 29.1 pg (ref 27.0–33.0)
MCHC: 32.6 g/dL (ref 31.6–35.4)
MCV: 89.2 fL (ref 81.4–101.7)
MPV: 10.8 fL (ref 7.5–12.5)
Monocytes Relative: 6.3 %
Neutro Abs: 5627 {cells}/uL (ref 1500–7800)
Neutrophils Relative %: 67.8 %
Platelets: 190 Thousand/uL (ref 140–400)
RBC: 4.54 Million/uL (ref 4.20–5.80)
RDW: 14.8 % (ref 11.0–15.0)
Total Lymphocyte: 24.1 %
WBC: 8.3 Thousand/uL (ref 3.8–10.8)

## 2024-10-13 LAB — BASIC METABOLIC PANEL WITH GFR
BUN: 14 mg/dL (ref 7–25)
CO2: 28 mmol/L (ref 20–32)
Calcium: 8.9 mg/dL (ref 8.6–10.3)
Chloride: 104 mmol/L (ref 98–110)
Creat: 0.92 mg/dL (ref 0.70–1.28)
Glucose, Bld: 126 mg/dL — ABNORMAL HIGH (ref 65–99)
Potassium: 3.5 mmol/L (ref 3.5–5.3)
Sodium: 139 mmol/L (ref 135–146)
eGFR: 85 mL/min/1.73m2 (ref >=60)

## 2024-10-13 LAB — BRAIN NATRIURETIC PEPTIDE: Brain Natriuretic Peptide: 23 pg/mL (ref <100)

## 2024-10-14 ENCOUNTER — Encounter: Payer: Self-pay | Admitting: Family Medicine

## 2024-10-14 ENCOUNTER — Ambulatory Visit: Admitting: Family Medicine

## 2024-10-14 VITALS — BP 159/80 | HR 109 | Resp 18 | Ht 65.0 in | Wt 191.0 lb

## 2024-10-14 DIAGNOSIS — E039 Hypothyroidism, unspecified: Secondary | ICD-10-CM | POA: Diagnosis not present

## 2024-10-14 DIAGNOSIS — J40 Bronchitis, not specified as acute or chronic: Secondary | ICD-10-CM

## 2024-10-14 DIAGNOSIS — R052 Subacute cough: Secondary | ICD-10-CM

## 2024-10-14 DIAGNOSIS — K649 Unspecified hemorrhoids: Secondary | ICD-10-CM

## 2024-10-14 MED ORDER — PROMETHAZINE-DM 6.25-15 MG/5ML PO SYRP: 5.0000 mL | ORAL_SOLUTION | Freq: Four times a day (QID) | ORAL | 0 refills | Status: DC | PRN

## 2024-10-14 MED ORDER — ALBUTEROL SULFATE HFA 108 (90 BASE) MCG/ACT IN AERS: 2.0000 | INHALATION_SPRAY | Freq: Four times a day (QID) | RESPIRATORY_TRACT | 0 refills | Status: DC | PRN

## 2024-10-14 MED ORDER — HYDROCORTISONE (PERIANAL) 2.5 % EX CREA: TOPICAL_CREAM | CUTANEOUS | 3 refills | Status: AC

## 2024-10-14 MED ORDER — LEVOTHYROXINE SODIUM 112 MCG PO TABS: 112.0000 ug | ORAL_TABLET | Freq: Every day | ORAL | 2 refills | Status: AC

## 2024-10-14 NOTE — Progress Notes (Signed)
 "     Established patient visit   [name]   DOB: 01-01-1946   78 y.o. Male  MRN: [id number] Visit Date: 10/14/2024  Today's healthcare provider: Nancyann Perry, MD   Chief Complaint  Patient presents with   Follow-up    Patient was seen at [facility] 12/05 and was seen by Dr.Sowles on 12/10 and had some labs done. Patient reports that his heart rate has been elevated and Dr.Sowles was concerned about it. Reports that he checked his BP las night and it read 130/74 P:79   Subjective    Discussed the use of AI scribe software for clinical note transcription with the patient, who gave verbal consent to proceed.  History of Present Illness   HAIDYN CHADDERDON is a 78 year old male who presents with persistent bronchitis symptoms.  About a week ago, he visited urgent care due to symptoms of bronchitis, including rhinorrhea, congestion, and drainage. He was prescribed Augmentin  and cough medicine at that time. His symptoms worsened, particularly congestion and a fever spike on Tuesday night, prompting a follow-up visit on Wednesday.  During the follow-up, his antibiotic was changed to doxycycline , and he was prescribed prednisone  and a different cough medication. He felt much better yesterday with reduced congestion and fever, but today he still experiences congestion and cough.  His blood pressure was 130/74 with a pulse rate of 79-80 last night, which is typical for him. He notes that his wife has been unwell with similar symptoms and is undergoing further evaluation, including a chest x-ray.  He is currently taking doxycycline , prednisone , and benzonatate  for his symptoms. He has been using the cough syrup as needed, which he finds effective. No gastrointestinal issues with the current antibiotic.      Outpatient Medications Prior to Visit  Medication Sig   ALPRAZolam  (XANAX ) 0.5 MG tablet Take 0.5-1 tablets (0.25-0.5 mg total) by mouth 3 (three) times daily as needed for anxiety.    amLODipine  (NORVASC ) 10 MG tablet Take 1 tablet (10 mg total) by mouth daily.   Bacillus Coagulans-Inulin (PROBIOTIC) 1-250 BILLION-MG CAPS Take 1 capsule by mouth daily at 6 (six) AM.   benzonatate  (TESSALON ) 100 MG capsule Take 1 capsule (100 mg total) by mouth 3 (three) times daily as needed.   Cholecalciferol (VITAMIN D3 PO) Take 1,000 Units by mouth daily.   diclofenac  sodium (VOLTAREN ) 1 % GEL Apply 4 g topically 4 (four) times daily as needed. (Patient taking differently: Apply 4 g topically 4 (four) times daily as needed (pain).)   fluticasone  (FLONASE ) 50 MCG/ACT nasal spray Place 2 sprays into both nostrils daily.   Multiple Vitamins-Iron (MULTIVITAMIN/IRON PO) Take 1 tablet by mouth daily at 6 (six) AM.   pantoprazole  (PROTONIX ) 40 MG tablet Take 1 tablet (40 mg total) by mouth daily.   predniSONE  (DELTASONE ) 10 MG tablet Take 1 tablet (10 mg total) by mouth 2 (two) times daily with a meal.   tamsulosin  (FLOMAX ) 0.4 MG CAPS capsule TAKE 1 CAPSULE BY MOUTH ONCE DAILY   hydrocortisone  (ANUSOL -HC) 2.5 % rectal cream APPLY TO AFFECTED AREAS 2 TO 3 TIMES DAILY AS NEEDED   levothyroxine  (SYNTHROID ) 112 MCG tablet TAKE ONE TABLET BY MOUTH EVERY DAY   doxycycline  (VIBRA -TABS) 100 MG tablet Take 1 tablet (100 mg total) by mouth 2 (two) times daily.   No facility-administered medications prior to visit.   Review of Systems  Constitutional:  Negative for appetite change, chills and fever.  Respiratory:  Negative for  chest tightness, shortness of breath and wheezing.   Cardiovascular:  Negative for chest pain and palpitations.  Gastrointestinal:  Negative for abdominal pain, nausea and vomiting.       Objective    BP (!) 159/80 (BP Location: Left Arm, Patient Position: Sitting)   Pulse (!) 109   Resp 18   Ht 5' 5 (1.651 m)   Wt 191 lb (86.6 kg)   SpO2 95%   BMI 31.78 kg/m   Physical Exam   General Appearance:    Mildly obese male, alert, cooperative, in no acute distress  HENT:    bilateral TM normal without fluid or infection and sinuses nontender  Eyes:    PERRL, conjunctiva/corneas clear, EOM's intact       Lungs:     Diffuse expiratory wheezes and rhonchi, no rales. respirations unlabored  Heart:    Tachycardic. Normal rhythm. No murmurs, rubs, or gallops.    Neurologic:   Awake, alert, oriented x 3. No apparent focal neurological           defect.        Assessment & Plan    1. Subacute cough (Primary) Did not Improve on Augmentin  prescribed last week at urgent care, but improving since staring doxycyline prescribed by Dr. Glenard 2 days ago, although cough is now getting more productive.   Finish doxycycline . Call if not resolved when finished. To ER is sx change or worsen.   add albuterol  (VENTOLIN  HFA) 108 (90 Base) MCG/ACT inhaler; Inhale 2 puffs into the lungs every 6 (six) hours as needed for wheezing or shortness of breath.  Dispense: 8 g; Refill: 0  refill promethazine -dextromethorphan (PROMETHAZINE -DM) 6.25-15 MG/5ML syrup; Take 5 mLs by mouth 4 (four) times daily as needed.  Dispense: 118 mL; Refill: 0  2. Bronchitis  - albuterol  (VENTOLIN  HFA) 108 (90 Base) MCG/ACT inhaler; Inhale 2 puffs into the lungs every 6 (six) hours as needed for wheezing or shortness of breath.  Dispense: 8 g; Refill: 0 - promethazine -dextromethorphan (PROMETHAZINE -DM) 6.25-15 MG/5ML syrup; Take 5 mLs by mouth 4 (four) times daily as needed.  Dispense: 118 mL; Refill: 0  3. Adult hypothyroidism Refill levothyroxine  (SYNTHROID ) 112 MCG tablet; Take 1 tablet (112 mcg total) by mouth daily.  Dispense: 90 tablet; Refill: 2  4. Hemorrhoids, unspecified hemorrhoid type Refill hydrocortisone  (ANUSOL -HC) 2.5 % rectal cream; APPLY TO AFFECTED AREAS 2 TO 3 TIMES DAILY AS NEEDED  Dispense: 28 g; Refill: 3      Nancyann Perry, MD  [facility] [phone] (phone) [phone] (fax)  [facility] "

## 2024-10-24 ENCOUNTER — Ambulatory Visit

## 2024-10-24 ENCOUNTER — Ambulatory Visit
Admission: RE | Admit: 2024-10-24 | Discharge: 2024-10-24 | Disposition: A | Discharge status: Home / Self Care | Payer: Self-pay | Attending: Emergency Medicine | Admitting: Emergency Medicine

## 2024-10-24 VITALS — BP 134/84 | HR 83 | Temp 98.1°F | Resp 18

## 2024-10-24 DIAGNOSIS — R052 Subacute cough: Secondary | ICD-10-CM

## 2024-10-24 NOTE — ED Provider Notes (Signed)
 " [name]    CSN: [id number] Arrival date & time: 10/24/24  1744      History   Chief Complaint Chief Complaint  Patient presents with   Wheezing    visited on 12/5 .  Request follow-up check on bronchitis and chest discomfort.  May need another round of antibiotics and ensure no pneumonia.  No fever. - Entered by patient    HPI BANE HAGY is a 78 y.o. male.  Patient presents with 1 month history of congestion and cough.  No fever or shortness of breath.  He has taken antibiotics and prednisone .  He states his cough is lingering and he feels like he has congestion in the right side of his chest.  Patient was seen at this urgent care on 10/07/2024; diagnosed with acute bronchitis; treated with Augmentin  and Promethazine  DM.  He was seen by his PCP on 10/12/2024; diagnosed with bronchitis, tachycardia, bilateral lower extremity edema; treated with doxycycline , prednisone , Tessalon  Perles.  He was seen again by his PCP on 10/14/2024; diagnosed with subacute cough, bronchitis, adult hypothyroidism, hemorrhoids; treated with albuterol  inhaler and refill of Promethazine  DM.  The history is provided by the patient and medical records.    Past Medical History:  Diagnosis Date   Allergy    Anemia    Anxiety    Diverticulitis    GERD (gastroesophageal reflux disease)    Hyperlipidemia    Hypertension    Hypothyroidism    Iron deficiency anemia 01/26/2018   Lab Results  Component  Value  Date     FERRITIN  5 (L)  01/22/2018         Lab Results  Component  Value  Date     WBC  4.2  01/22/2018     HGB  9.1 (L)  01/22/2018     HCT  31.7 (L)  01/22/2018     MCV  73 (L)  01/22/2018     PLT  164  01/22/2018         Melanoma of skin (HCC) 03/13/2003   SBO (small bowel obstruction) (HCC) 10/16/2020   Thyroid  disease     Patient Active Problem List   Diagnosis Date Noted   Osteoarthritis of knee 05/18/2024   Vitamin D  deficiency 03/28/2022   Abnormal CT scan 03/12/2021    Hiatal hernia 12/03/2020   Umbilical hernia without obstruction and without gangrene 12/03/2020   History of adenomatous polyp of colon 03/09/2018   Urinary hesitancy 02/25/2017   Fatty infiltration of liver 07/30/2015   Hepatic cyst 07/30/2015   Primary hypertension 02/18/2010   Allergic rhinitis 02/22/2009   BPH (benign prostatic hyperplasia) 10/06/2007   HLD (hyperlipidemia) 02/23/2007   Anxiety 12/15/2006   Acid reflux 11/04/2003   Adult hypothyroidism 07/03/1989    Past Surgical History:  Procedure Laterality Date   COLONOSCOPY WITH PROPOFOL  N/A 03/03/2018   Procedure: COLONOSCOPY WITH PROPOFOL ;  Surgeon: Therisa Bi, MD;  Location: [facility] ENDOSCOPY;  Service: Gastroenterology;  Laterality: N/A;   ESOPHAGOGASTRODUODENOSCOPY (EGD) WITH PROPOFOL  N/A 03/03/2018   Procedure: ESOPHAGOGASTRODUODENOSCOPY (EGD) WITH PROPOFOL ;  Surgeon: Therisa Bi, MD;  Location: [facility] ENDOSCOPY;  Service: Gastroenterology;  Laterality: N/A;   GIVENS CAPSULE STUDY N/A 03/12/2018   Procedure: GIVENS CAPSULE STUDY;  Surgeon: Therisa Bi, MD;  Location: [facility] ENDOSCOPY;  Service: Gastroenterology;  Laterality: N/A;   lymph node removed  11/03/2002   TONSILLECTOMY AND ADENOIDECTOMY  11/03/1952       Home Medications    Prior to Admission  medications  Medication Sig Start Date End Date Taking? Authorizing Provider  albuterol  (VENTOLIN  HFA) 108 (90 Base) MCG/ACT inhaler Inhale 2 puffs into the lungs every 6 (six) hours as needed for wheezing or shortness of breath. 10/14/24 10/28/24  Gasper Nancyann BRAVO, MD  ALPRAZolam  (XANAX ) 0.5 MG tablet Take 0.5-1 tablets (0.25-0.5 mg total) by mouth 3 (three) times daily as needed for anxiety. 05/18/24   Gasper Nancyann BRAVO, MD  amLODipine  (NORVASC ) 10 MG tablet Take 1 tablet (10 mg total) by mouth daily. 05/18/24   Gasper Nancyann BRAVO, MD  Bacillus Coagulans-Inulin (PROBIOTIC) 1-250 BILLION-MG CAPS Take 1 capsule by mouth daily at 6 (six) AM. 10/24/20   [provider]   benzonatate  (TESSALON ) 100 MG capsule Take 1 capsule (100 mg total) by mouth 3 (three) times daily as needed. 10/12/24   Sowles, Krichna, MD  Cholecalciferol (VITAMIN D3 PO) Take 1,000 Units by mouth daily.    [provider]  diclofenac  sodium (VOLTAREN ) 1 % GEL Apply 4 g topically 4 (four) times daily as needed. Patient taking differently: Apply 4 g topically 4 (four) times daily as needed (pain). 11/08/18   Gasper Nancyann BRAVO, MD  doxycycline  (VIBRA -TABS) 100 MG tablet Take 1 tablet (100 mg total) by mouth 2 (two) times daily. 10/12/24   Sowles, Krichna, MD  fluticasone  (FLONASE ) 50 MCG/ACT nasal spray Place 2 sprays into both nostrils daily. 05/18/24   Gasper Nancyann BRAVO, MD  hydrocortisone  (ANUSOL -HC) 2.5 % rectal cream APPLY TO AFFECTED AREAS 2 TO 3 TIMES DAILY AS NEEDED 10/14/24   Gasper Nancyann BRAVO, MD  levothyroxine  (SYNTHROID ) 112 MCG tablet Take 1 tablet (112 mcg total) by mouth daily. 10/14/24   Gasper Nancyann BRAVO, MD  Multiple Vitamins-Iron (MULTIVITAMIN/IRON PO) Take 1 tablet by mouth daily at 6 (six) AM.    [provider]  pantoprazole  (PROTONIX ) 40 MG tablet Take 1 tablet (40 mg total) by mouth daily. 05/18/24   Gasper Nancyann BRAVO, MD  predniSONE  (DELTASONE ) 10 MG tablet Take 1 tablet (10 mg total) by mouth 2 (two) times daily with a meal. 10/12/24   Glenard, Krichna, MD  promethazine -dextromethorphan (PROMETHAZINE -DM) 6.25-15 MG/5ML syrup Take 5 mLs by mouth 4 (four) times daily as needed. 10/14/24   Gasper Nancyann BRAVO, MD  tamsulosin  (FLOMAX ) 0.4 MG CAPS capsule TAKE 1 CAPSULE BY MOUTH ONCE DAILY 01/02/24   Gasper Nancyann BRAVO, MD    Family History Family History  Problem Relation Age of Onset   Hypertension Mother    Cancer Father        prostate   Diabetes Son     Social History Social History[1]   Allergies   Patient has no known allergies.   Review of Systems Review of Systems  Constitutional:  Negative for chills and fever.  HENT:  Positive for congestion.  Negative for ear pain and sore throat.   Respiratory:  Positive for cough. Negative for shortness of breath.   Cardiovascular:  Negative for chest pain and palpitations.     Physical Exam Triage Vital Signs ED Triage Vitals  Encounter Vitals Group     BP      Girls Systolic BP Percentile      Girls Diastolic BP Percentile      Boys Systolic BP Percentile      Boys Diastolic BP Percentile      Pulse      Resp      Temp      Temp src      SpO2  Weight      Height      Head Circumference      Peak Flow      Pain Score      Pain Loc      Pain Education      Exclude from Growth Chart    No data found.  Updated Vital Signs BP 134/84   Pulse 83   Temp 98.1 F (36.7 C)   Resp 18   SpO2 97%   Visual Acuity Right Eye Distance:   Left Eye Distance:   Bilateral Distance:    Right Eye Near:   Left Eye Near:    Bilateral Near:     Physical Exam Constitutional:      General: He is not in acute distress. HENT:     Right Ear: Tympanic membrane normal.     Left Ear: Tympanic membrane normal.     Nose: Nose normal.     Mouth/Throat:     Mouth: Mucous membranes are moist.     Pharynx: Oropharynx is clear.  Cardiovascular:     Rate and Rhythm: Normal rate and regular rhythm.     Heart sounds: Normal heart sounds.  Pulmonary:     Effort: Pulmonary effort is normal. No respiratory distress.     Breath sounds: Normal breath sounds.  Neurological:     Mental Status: He is alert.      UC Treatments / Results  Labs (all labs ordered are listed, but only abnormal results are displayed) Labs Reviewed - No data to display  EKG   Radiology DG Chest 2 View Result Date: 10/24/2024 CLINICAL DATA:  Productive cough, right-sided chest congestion. EXAM: CHEST - 2 VIEW COMPARISON:  08/29/2021. FINDINGS: The heart size and mediastinal contours are within normal limits. There is atherosclerotic calcification of the aorta. A large hiatal hernia is noted. Minimal  atelectasis is present at the left lung base. No consolidation, effusion, or pneumothorax is seen. Surgical clips are noted over the right chest. No acute osseous abnormality is seen. IMPRESSION: 1. No active cardiopulmonary disease. 2. Large hiatal hernia. Electronically Signed   By: Leita Birmingham M.D.   On: 10/24/2024 18:51    Procedures Procedures (including critical care time)  Medications Ordered in UC Medications - No data to display  Initial Impression / Assessment and Plan / UC Course  I have reviewed the triage vital signs and the nursing notes.  Pertinent labs & imaging results that were available during my care of the patient were reviewed by me and considered in my medical decision making (see chart for details).    Subacute cough.  Afebrile and vital signs are stable.  O2 sat 97% on room air.  CXR shows no active cardiopulmonary disease.  Discussed chest x-ray result with patient via telephone.  Discussed continued symptomatic treatment and instructed him to follow-up with his PCP.  ED precautions given.  Education provided on cough.  He agrees to plan of care. Final Clinical Impressions(s) / UC Diagnoses   Final diagnoses:  Subacute cough     Discharge Instructions      Your chest x-ray is pending.  I will call you with the result.    Follow up with your primary care provider tomorrow.  Go to the emergency department if you have worsening symptoms.        ED Prescriptions   None    PDMP not reviewed this encounter.     [1]  Social History Tobacco Use  Smoking status: Never   Smokeless tobacco: Never  Vaping Use   Vaping status: Never Used  Substance Use Topics   Alcohol  use: No   Drug use: No     Corlis Burnard DEL, NP 10/24/24 1905  "

## 2024-10-24 NOTE — Discharge Instructions (Signed)
 Your chest x-ray is pending.  I will call you with the result.    Follow up with your primary care provider tomorrow.  Go to the emergency department if you have worsening symptoms.

## 2024-10-24 NOTE — ED Triage Notes (Signed)
 Patient to Urgent Care with complaints of lingering congestion/ right sided chest congestion.   Seen here 12/5, then 12/10 and 12/12. Reports completing multiple medications and started feeling better over the weekend.

## 2024-10-25 ENCOUNTER — Ambulatory Visit (HOSPITAL_COMMUNITY): Payer: Self-pay

## 2024-11-06 ENCOUNTER — Other Ambulatory Visit: Payer: Self-pay | Admitting: Family Medicine

## 2024-11-23 ENCOUNTER — Encounter: Payer: Self-pay | Admitting: Family Medicine

## 2024-11-23 ENCOUNTER — Ambulatory Visit (INDEPENDENT_AMBULATORY_CARE_PROVIDER_SITE_OTHER): Admitting: Family Medicine

## 2024-11-23 VITALS — BP 153/82 | HR 86 | Resp 16 | Ht 63.0 in | Wt 187.3 lb

## 2024-11-23 DIAGNOSIS — J40 Bronchitis, not specified as acute or chronic: Secondary | ICD-10-CM

## 2024-11-23 DIAGNOSIS — E785 Hyperlipidemia, unspecified: Secondary | ICD-10-CM

## 2024-11-23 DIAGNOSIS — K449 Diaphragmatic hernia without obstruction or gangrene: Secondary | ICD-10-CM

## 2024-11-23 DIAGNOSIS — K219 Gastro-esophageal reflux disease without esophagitis: Secondary | ICD-10-CM

## 2024-11-23 DIAGNOSIS — I10 Essential (primary) hypertension: Secondary | ICD-10-CM

## 2024-11-23 DIAGNOSIS — I700 Atherosclerosis of aorta: Secondary | ICD-10-CM | POA: Insufficient documentation

## 2024-11-23 MED ORDER — ROSUVASTATIN CALCIUM 5 MG PO TABS: 5.0000 mg | ORAL_TABLET | Freq: Every day | ORAL | 2 refills | Status: AC

## 2024-11-23 NOTE — Patient Instructions (Signed)
 SABRA  Please review the attached list of medications and notify my office if there are any errors.   . Please bring all of your medications to every appointment so we can make sure that our medication list is the same as yours.

## 2024-11-23 NOTE — Progress Notes (Signed)
 "     Established patient visit   [name]   DOB: 06-12-1946   79 y.o. Male  MRN: [id number] Visit Date: 11/23/2024  Today's healthcare provider: Nancyann Perry, MD   Chief Complaint  Patient presents with   Follow-up    F/u on  Bronchitis  and xray    Subjective    Discussed the use of AI scribe software for clinical note transcription with the patient, who gave verbal consent to proceed.  History of Present Illness   Christian Mora is a 79 year old male who presents for follow-up of bronchitis.  He was initially seen on December 5th at urgent care and subsequently on December 10th and December 12th. During the December 12th visit, he was prescribed Augmentin  and albuterol . He returned to urgent care on December 22nd due to wheezing, and a chest X-ray at that time was negative. It took three to four weeks to recover from the bronchitis, but he is currently feeling better with no ongoing wheezing.  During a recent visit to urgent care, a chest X-ray revealed aortic calcifications. He has no symptoms related to this finding and was unaware of it prior to the X-ray. A CT scan from a couple of years ago was performed, and the patient recalls that it mentioned mild aortic atherosclerosis.  He monitors his blood pressure regularly, noting an average of 123/72 mmHg over the past 16 days. He has a history of low HDL cholesterol and a total cholesterol level that has been in the 170-180 range. He was previously on a statin but discontinued it years ago.  He has a history of a large hiatal hernia, which he manages with pantoprazole . Stress and dietary habits can exacerbate symptoms, but he generally keeps it under control by eating slowly and watching his diet.     Lab Results  Component Value Date   CHOL 170 05/18/2024   HDL 31 (L) 05/18/2024   LDLCALC 107 (H) 05/18/2024   TRIG 183 (H) 05/18/2024   CHOLHDL 5.5 (H) 05/18/2024   Lab Results  Component Value Date   NA 139  10/12/2024   K 3.5 10/12/2024   CREATININE 0.92 10/12/2024   EGFR 85 10/12/2024   GLUCOSE 126 (H) 10/12/2024     Medications: Show/hide medication list[1] Review of Systems  Constitutional:  Negative for appetite change, chills and fever.  Respiratory:  Negative for chest tightness, shortness of breath and wheezing.   Cardiovascular:  Negative for chest pain and palpitations.  Gastrointestinal:  Negative for abdominal pain, nausea and vomiting.       Objective    BP (!) 153/82 (BP Location: Left Arm, Patient Position: Sitting, Cuff Size: Normal)   Pulse 86   Resp 16   Ht 5' 3 (1.6 m)   Wt 187 lb 4.8 oz (85 kg)   SpO2 98%   BMI 33.18 kg/m   Physical Exam   General appearance: Mildly obese male, cooperative and in no acute distress Head: Normocephalic, without obvious abnormality, atraumatic Respiratory: Respirations even and unlabored, normal respiratory rate Extremities: All extremities are intact.  Skin: Skin color, texture, turgor normal. No rashes seen  Psych: Appropriate mood and affect. Neurologic: Mental status: Alert, oriented to person, place, and time, thought content appropriate.    Assessment & Plan    1. Abdominal aortic atherosclerosis (Primary) Counseled on relationship to other types of systemic vascular disease and importance of BP and lipid management to reduce risk of vascular events. Previously  tolerated statin therapy. Start rosuvastatin  (CRESTOR ) 5 MG tablet; Take 1 tablet (5 mg total) by mouth daily.  Dispense: 90 tablet; Refill: 2  2. Primary hypertension Home Bps much better than today's office measurement . Continue current medications.    3. Dyslipidemia Start statin as above with goal LDL <80 and HDL >40.   4. Hiatal hernia Long standing with no change in symptoms recently   5. Gastroesophageal reflux disease without esophagitis Doing well on current PPI  6. Bronchitis Well controlled.  Continue current medications.    Return in  about 4 months (around 03/23/2025) for Hypertension.     Nancyann Perry, MD  [facility] [phone] (phone) [phone] (fax)  [facility]    [1]  Outpatient Medications Prior to Visit  Medication Sig   albuterol  (VENTOLIN  HFA) 108 (90 Base) MCG/ACT inhaler Inhale 2 puffs into the lungs every 6 (six) hours as needed for wheezing or shortness of breath.   ALPRAZolam  (XANAX ) 0.5 MG tablet Take 0.5-1 tablets (0.25-0.5 mg total) by mouth 3 (three) times daily as needed for anxiety.   amLODipine  (NORVASC ) 10 MG tablet TAKE ONE TABLET BY MOUTH ONCE DAILY   Bacillus Coagulans-Inulin (PROBIOTIC) 1-250 BILLION-MG CAPS Take 1 capsule by mouth daily at 6 (six) AM.   Cholecalciferol (VITAMIN D3 PO) Take 1,000 Units by mouth daily.   diclofenac  sodium (VOLTAREN ) 1 % GEL Apply 4 g topically 4 (four) times daily as needed. (Patient taking differently: Apply 4 g topically 4 (four) times daily as needed (pain).)   fluticasone  (FLONASE ) 50 MCG/ACT nasal spray Place 2 sprays into both nostrils daily.   hydrocortisone  (ANUSOL -HC) 2.5 % rectal cream APPLY TO AFFECTED AREAS 2 TO 3 TIMES DAILY AS NEEDED   levothyroxine  (SYNTHROID ) 112 MCG tablet Take 1 tablet (112 mcg total) by mouth daily.   Multiple Vitamins-Iron (MULTIVITAMIN/IRON PO) Take 1 tablet by mouth daily at 6 (six) AM.   pantoprazole  (PROTONIX ) 40 MG tablet Take 1 tablet (40 mg total) by mouth daily.   tamsulosin  (FLOMAX ) 0.4 MG CAPS capsule TAKE 1 CAPSULE BY MOUTH ONCE DAILY   [DISCONTINUED] benzonatate  (TESSALON ) 100 MG capsule Take 1 capsule (100 mg total) by mouth 3 (three) times daily as needed.   [DISCONTINUED] promethazine -dextromethorphan (PROMETHAZINE -DM) 6.25-15 MG/5ML syrup Take 5 mLs by mouth 4 (four) times daily as needed.   [DISCONTINUED] doxycycline  (VIBRA -TABS) 100 MG tablet Take 1 tablet (100 mg total) by mouth 2 (two) times daily.   [DISCONTINUED] predniSONE  (DELTASONE ) 10 MG tablet Take 1  tablet (10 mg total) by mouth 2 (two) times daily with a meal.   No facility-administered medications prior to visit.   "

## 2024-11-29 ENCOUNTER — Ambulatory Visit

## 2024-11-29 DIAGNOSIS — Z0000 Encounter for general adult medical examination without abnormal findings: Secondary | ICD-10-CM

## 2024-11-29 NOTE — Progress Notes (Signed)
 "  No chief complaint on file.    Subjective:   Christian Mora is a 79 y.o. male who presents for a Medicare Annual Wellness Visit.  Visit info / Clinical Intake: Medicare Wellness Visit Type:: Subsequent Annual Wellness Visit Persons participating in visit and providing information:: patient Medicare Wellness Visit Mode:: Telephone If telephone:: video declined Since this visit was completed virtually, some vitals may be partially provided or unavailable. Missing vitals are due to the limitations of the virtual format.: Unable to obtain vitals - no equipment If Telephone or Video please confirm:: I connected with patient using audio/video enable telemedicine. I verified patient identity with two identifiers, discussed telehealth limitations, and patient agreed to proceed. Patient Location:: HOME Provider Location:: HOME OFFICE Interpreter Needed?: No Pre-visit prep was completed: yes AWV questionnaire completed by patient prior to visit?: yes Date:: 11/26/24 Living arrangements:: lives with spouse/significant other Patient's Overall Health Status Rating: good Typical amount of pain: some Does pain affect daily life?: no Are you currently prescribed opioids?: no  Dietary Habits and Nutritional Risks How many meals a day?: 3 Eats fruit and vegetables daily?: yes Most meals are obtained by: preparing own meals In the last 2 weeks, have you had any of the following?: none Diabetic:: no  Functional Status Activities of Daily Living (to include ambulation/medication): Independent Ambulation: Independent Medication Administration: Independent Home Management (perform basic housework or laundry): Independent Manage your own finances?: yes Primary transportation is: driving Concerns about vision?: no *vision screening is required for [facility]* ([employer]ES- DR.PORFILIO) Concerns about hearing?: no (HAS AIDS BUT DOESN'T WEAR THEM)  Fall Screening Falls in the past year?: 0 Number of  falls in past year: 0 Was there an injury with Fall?: 0 Fall Risk Category Calculator: 0 Patient Fall Risk Level: Low Fall Risk  Fall Risk Patient at Risk for Falls Due to: No Fall Risks Fall risk Follow up: Falls evaluation completed; Falls prevention discussed  Home and Transportation Safety: All rugs have non-skid backing?: (!) no All stairs or steps have railings?: N/A, no stairs Grab bars in the bathtub or shower?: yes Have non-skid surface in bathtub or shower?: yes Good home lighting?: yes Regular seat belt use?: yes Hospital stays in the last year:: no  Cognitive Assessment Difficulty concentrating, remembering, or making decisions? : no Will 6CIT or Mini Cog be Completed: yes What year is it?: 0 points What month is it?: 0 points Give patient an address phrase to remember (5 components): [address] [place], [place] About what time is it?: 0 points Count backwards from 20 to 1: 0 points Say the months of the year in reverse: 0 points Repeat the address phrase from earlier: 0 points 6 CIT Score: 0 points  Advance Directives (For Healthcare) Does Patient Have a Medical Advance Directive?: No Would patient like information on creating a medical advance directive?: No - Patient declined  Reviewed/Updated  Reviewed/Updated: Reviewed All (Medical, Surgical, Family, Medications, Allergies, Care Teams, Patient Goals)    Allergies (verified) Patient has no known allergies.   Current Medications (verified) Outpatient Encounter Medications as of 11/29/2024  Medication Sig   ALPRAZolam  (XANAX ) 0.5 MG tablet Take 0.5-1 tablets (0.25-0.5 mg total) by mouth 3 (three) times daily as needed for anxiety.   amLODipine  (NORVASC ) 10 MG tablet TAKE ONE TABLET BY MOUTH ONCE DAILY   Bacillus Coagulans-Inulin (PROBIOTIC) 1-250 BILLION-MG CAPS Take 1 capsule by mouth daily at 6 (six) AM.   Cholecalciferol (VITAMIN D3 PO) Take 1,000 Units by  mouth daily.   diclofenac  sodium (VOLTAREN ) 1  % GEL Apply 4 g topically 4 (four) times daily as needed. (Patient taking differently: Apply 4 g topically 4 (four) times daily as needed (pain).)   fluticasone  (FLONASE ) 50 MCG/ACT nasal spray Place 2 sprays into both nostrils daily.   hydrocortisone  (ANUSOL -HC) 2.5 % rectal cream APPLY TO AFFECTED AREAS 2 TO 3 TIMES DAILY AS NEEDED   levothyroxine  (SYNTHROID ) 112 MCG tablet Take 1 tablet (112 mcg total) by mouth daily.   Multiple Vitamins-Iron (MULTIVITAMIN/IRON PO) Take 1 tablet by mouth daily at 6 (six) AM.   pantoprazole  (PROTONIX ) 40 MG tablet Take 1 tablet (40 mg total) by mouth daily.   rosuvastatin  (CRESTOR ) 5 MG tablet Take 1 tablet (5 mg total) by mouth daily.   tamsulosin  (FLOMAX ) 0.4 MG CAPS capsule TAKE 1 CAPSULE BY MOUTH ONCE DAILY   No facility-administered encounter medications on file as of 11/29/2024.    History: Past Medical History:  Diagnosis Date   Allergy    Anemia    Anxiety    Diverticulitis    GERD (gastroesophageal reflux disease)    Hyperlipidemia    Hypertension    Hypothyroidism    Iron deficiency anemia 01/26/2018   Lab Results  Component  Value  Date     FERRITIN  5 (L)  01/22/2018         Lab Results  Component  Value  Date     WBC  4.2  01/22/2018     HGB  9.1 (L)  01/22/2018     HCT  31.7 (L)  01/22/2018     MCV  73 (L)  01/22/2018     PLT  164  01/22/2018         Melanoma of skin (HCC) 03/13/2003   SBO (small bowel obstruction) (HCC) 10/16/2020   Thyroid  disease    Past Surgical History:  Procedure Laterality Date   COLONOSCOPY WITH PROPOFOL  N/A 03/03/2018   Procedure: COLONOSCOPY WITH PROPOFOL ;  Surgeon: Therisa Bi, MD;  [facility] END[facility];  Service: Gastroenterology;  Laterality: N/A;   ESOPHAGOGASTRODUODENOSCOPY (EGD) WITH PROPOFOL  N/A 03/03/2018   Procedure: ESOPHAGOGASTRODUODENOSCOPY (EGD) WITH PROPOFOL ;  Surgeon: Therisa Bi, MD;  Location: [facility] ENDOSCOPY;  Service: Gastroenterology;  Laterality: N/A;   GIVENS CAPSULE STUDY N/A  03/12/2018   Procedure: GIVENS CAPSULE STUDY;  Surgeon: Therisa Bi, MD;  Location: [facility] ENDOSCOPY;  Service: Gastroenterology;  Laterality: N/A;   lymph node removed  11/03/2002   TONSILLECTOMY AND ADENOIDECTOMY  11/03/1952   Family History  Problem Relation Age of Onset   Hypertension Mother    Cancer Father        prostate   Diabetes Son    Social History   Occupational History   Occupation: retired  Tobacco Use   Smoking status: Never   Smokeless tobacco: Never  Vaping Use   Vaping status: Never Used  Substance and Sexual Activity   Alcohol  use: Never   Drug use: Never   Sexual activity: Not Currently   Tobacco Counseling Counseling given: Not Answered  SDOH Screenings   Food Insecurity: No Food Insecurity (11/29/2024)  Housing: Unknown (11/29/2024)  Transportation Needs: No Transportation Needs (11/29/2024)  Utilities: Not At Risk (11/29/2024)  Alcohol  Screen: Low Risk (11/21/2021)  Depression (PHQ2-9): Low Risk (11/29/2024)  Financial Resource Strain: Low Risk (10/13/2024)  Physical Activity: Insufficiently Active (11/29/2024)  Social Connections: Moderately Integrated (11/29/2024)  Stress: No Stress Concern Present (11/29/2024)  Tobacco Use: Low Risk (11/29/2024)  Health Literacy:  Adequate Health Literacy (11/29/2024)   See flowsheets for full screening details  Depression Screen PHQ 2 & 9 Depression Scale- Over the past 2 weeks, how often have you been bothered by any of the following problems? Little interest or pleasure in doing things: 0 Feeling down, depressed, or hopeless (PHQ Adolescent also includes...irritable): 0 PHQ-2 Total Score: 0     Goals Addressed             This Visit's Progress    Cut out extra servings               Objective:    There were no vitals filed for this visit. There is no height or weight on file to calculate BMI.  Hearing/Vision screen No results found. Immunizations and Health Maintenance Health Maintenance   Topic Date Due   Colonoscopy  03/03/2021   COVID-19 Vaccine (6 - 2025-26 season) 01/18/2025   Medicare Annual Wellness (AWV)  11/29/2025   DTaP/Tdap/Td (2 - Td or Tdap) 11/11/2027   Pneumococcal Vaccine: 50+ Years  Completed   Influenza Vaccine  Completed   Hepatitis C Screening  Completed   Zoster Vaccines- Shingrix  Completed   Meningococcal B Vaccine  Aged Out   Fecal DNA (Cologuard)  Discontinued        Assessment/Plan:  This is a routine wellness examination for Kwesi.  Patient Care Team: Gasper Nancyann BRAVO, MD as PCP - General (Family Medicine) Dasher, Alm LABOR, MD (Dermatology) Jaye Fallow, MD as Referring Physician (Ophthalmology) Ninette Lauraine BROCKS, MD as Referring Physician (Audiology) Jordis Laneta FALCON, MD as Consulting Physician (General Surgery) Marchia Drivers, MD as Consulting Physician (Orthopedic Surgery)  I have personally reviewed and noted the following in the patients chart:   Medical and social history Use of alcohol , tobacco or illicit drugs  Current medications and supplements including opioid prescriptions. Functional ability and status Nutritional status Physical activity Advanced directives List of other physicians Hospitalizations, surgeries, and ER visits in previous 12 months Vitals Screenings to include cognitive, depression, and falls Referrals and appointments  No orders of the defined types were placed in this encounter.  In addition, I have reviewed and discussed with patient certain preventive protocols, quality metrics, and best practice recommendations. A written personalized care plan for preventive services as well as general preventive health recommendations were provided to patient.   Jhonnie GORMAN Das, LPN   [id number]   Return in about 1 year (around 11/29/2025).  After Visit Summary: (MyChart) Due to this being a telephonic visit, the after visit summary with patients personalized plan was offered to patient via MyChart    Nurse Notes: UTD ON SHOTS, AGED OUT OF COLONOSCOPY  No voiced or noted concerns at this time  "

## 2024-11-29 NOTE — Patient Instructions (Addendum)
 Christian Mora,  Thank you for taking the time for your Medicare Wellness Visit. I appreciate your continued commitment to your health goals. Please review the care plan we discussed, and feel free to reach out if I can assist you further.  Please note that Annual Wellness Visits do not include a physical exam. Some assessments may be limited, especially if the visit was conducted virtually. If needed, we may recommend an in-person follow-up with your provider.  Ongoing Care Seeing your primary care provider every 3 to 6 months helps us  monitor your health and provide consistent, personalized care. 03/17/25 @ 8:40 APPT W/ DR.FISHER  Referrals If a referral was made during today's visit and you haven't received any updates within two weeks, please contact the referred provider directly to check on the status.  Recommended Screenings:  Health Maintenance  Topic Date Due   Colon Cancer Screening  03/03/2021   Medicare Annual Wellness Visit  03/01/2024   COVID-19 Vaccine (6 - 2025-26 season) 01/18/2025   DTaP/Tdap/Td vaccine (2 - Td or Tdap) 11/11/2027   Pneumococcal Vaccine for age over 78  Completed   Flu Shot  Completed   Hepatitis C Screening  Completed   Zoster (Shingles) Vaccine  Completed   Meningitis B Vaccine  Aged Out   Cologuard (Stool DNA test)  Discontinued     Vision: Annual vision screenings are recommended for early detection of glaucoma, cataracts, and diabetic retinopathy. These exams can also reveal signs of chronic conditions such as diabetes and high blood pressure.  Dental: Annual dental screenings help detect early signs of oral cancer, gum disease, and other conditions linked to overall health, including heart disease and diabetes.  Please see the attached documents for additional preventive care recommendations.   NEXT AWV 12/05/25 @ 8:10 AM IN PERSON

## 2025-03-17 ENCOUNTER — Ambulatory Visit: Admitting: Family Medicine

## 2025-12-05 ENCOUNTER — Ambulatory Visit
